# Patient Record
Sex: Male | Born: 1955 | Race: White | Hispanic: No | Marital: Single | Smoking: Current every day smoker
Health system: Southern US, Community
[De-identification: ages and names within clinical notes are randomized; demographics above are authoritative.]

## PROBLEM LIST (undated history)

## (undated) DIAGNOSIS — F429 Obsessive-compulsive disorder, unspecified: Secondary | ICD-10-CM

## (undated) DIAGNOSIS — R972 Elevated prostate specific antigen [PSA]: Secondary | ICD-10-CM

## (undated) DIAGNOSIS — E785 Hyperlipidemia, unspecified: Secondary | ICD-10-CM

## (undated) DIAGNOSIS — Z8719 Personal history of other diseases of the digestive system: Secondary | ICD-10-CM

## (undated) DIAGNOSIS — Z8619 Personal history of other infectious and parasitic diseases: Secondary | ICD-10-CM

## (undated) DIAGNOSIS — R0989 Other specified symptoms and signs involving the circulatory and respiratory systems: Secondary | ICD-10-CM

## (undated) DIAGNOSIS — F419 Anxiety disorder, unspecified: Secondary | ICD-10-CM

## (undated) DIAGNOSIS — K219 Gastro-esophageal reflux disease without esophagitis: Secondary | ICD-10-CM

## (undated) DIAGNOSIS — I10 Essential (primary) hypertension: Secondary | ICD-10-CM

## (undated) DIAGNOSIS — F1020 Alcohol dependence, uncomplicated: Secondary | ICD-10-CM

## (undated) DIAGNOSIS — Z8659 Personal history of other mental and behavioral disorders: Secondary | ICD-10-CM

## (undated) DIAGNOSIS — J449 Chronic obstructive pulmonary disease, unspecified: Secondary | ICD-10-CM

## (undated) DIAGNOSIS — J40 Bronchitis, not specified as acute or chronic: Secondary | ICD-10-CM

## (undated) DIAGNOSIS — E538 Deficiency of other specified B group vitamins: Secondary | ICD-10-CM

## (undated) HISTORY — DX: Essential (primary) hypertension: I10

## (undated) HISTORY — DX: Anxiety disorder, unspecified: F41.9

## (undated) HISTORY — DX: Alcohol dependence, uncomplicated: F10.20

## (undated) HISTORY — DX: Personal history of other diseases of the digestive system: Z87.19

## (undated) HISTORY — DX: Personal history of other mental and behavioral disorders: Z86.59

## (undated) HISTORY — PX: LUMBAR DISC SURGERY: SHX700

## (undated) HISTORY — DX: Chronic obstructive pulmonary disease, unspecified: J44.9

## (undated) HISTORY — PX: ELBOW FRACTURE SURGERY: SHX616

## (undated) HISTORY — DX: Hyperlipidemia, unspecified: E78.5

---
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
## 1999-02-09 ENCOUNTER — Observation Stay (HOSPITAL_COMMUNITY): Admission: RE | Admit: 1999-02-09 | Discharge: 1999-02-10 | Payer: Self-pay | Admitting: Neurosurgery

## 1999-02-09 ENCOUNTER — Encounter: Payer: Self-pay | Admitting: Neurosurgery

## 2010-06-03 ENCOUNTER — Ambulatory Visit: Payer: Self-pay | Admitting: Internal Medicine

## 2010-06-03 ENCOUNTER — Encounter: Payer: Self-pay | Admitting: Internal Medicine

## 2010-06-03 DIAGNOSIS — F17200 Nicotine dependence, unspecified, uncomplicated: Secondary | ICD-10-CM | POA: Insufficient documentation

## 2010-06-03 DIAGNOSIS — F329 Major depressive disorder, single episode, unspecified: Secondary | ICD-10-CM | POA: Insufficient documentation

## 2010-06-03 DIAGNOSIS — F411 Generalized anxiety disorder: Secondary | ICD-10-CM | POA: Insufficient documentation

## 2010-06-03 DIAGNOSIS — K219 Gastro-esophageal reflux disease without esophagitis: Secondary | ICD-10-CM | POA: Insufficient documentation

## 2010-06-03 LAB — CONVERTED CEMR LAB
ALT: 40 units/L (ref 0–53)
AST: 34 units/L (ref 0–37)
Albumin: 4.5 g/dL (ref 3.5–5.2)
Alkaline Phosphatase: 74 units/L (ref 39–117)
BUN: 8 mg/dL (ref 6–23)
Basophils Absolute: 0.1 10*3/uL (ref 0.0–0.1)
Basophils Relative: 0.4 % (ref 0.0–3.0)
Bilirubin, Direct: 0.2 mg/dL (ref 0.0–0.3)
CO2: 29 meq/L (ref 19–32)
Calcium: 9.3 mg/dL (ref 8.4–10.5)
Chloride: 96 meq/L (ref 96–112)
Cholesterol: 217 mg/dL — ABNORMAL HIGH (ref 0–200)
Creatinine, Ser: 1 mg/dL (ref 0.4–1.5)
Direct LDL: 154.1 mg/dL
Eosinophils Absolute: 0.1 10*3/uL (ref 0.0–0.7)
Eosinophils Relative: 0.5 % (ref 0.0–5.0)
GFR calc non Af Amer: 78.87 mL/min (ref >60)
Glucose, Bld: 93 mg/dL (ref 70–99)
HCT: 45.9 % (ref 39.0–52.0)
HDL: 41.2 mg/dL (ref >39.00)
Hemoglobin: 15.9 g/dL (ref 13.0–17.0)
Lymphocytes Relative: 22.5 % (ref 12.0–46.0)
Lymphs Abs: 2.9 10*3/uL (ref 0.7–4.0)
MCHC: 34.7 g/dL (ref 30.0–36.0)
MCV: 93.6 fL (ref 78.0–100.0)
Monocytes Absolute: 0.5 10*3/uL (ref 0.1–1.0)
Monocytes Relative: 3.8 % (ref 3.0–12.0)
Neutro Abs: 9.3 10*3/uL — ABNORMAL HIGH (ref 1.4–7.7)
Neutrophils Relative %: 72.8 % (ref 43.0–77.0)
PSA: 1.9 ng/mL (ref 0.10–4.00)
Platelets: 305 10*3/uL (ref 150.0–400.0)
Potassium: 4.3 meq/L (ref 3.5–5.1)
RBC: 4.91 M/uL (ref 4.22–5.81)
RDW: 13.6 % (ref 11.5–14.6)
Sodium: 136 meq/L (ref 135–145)
TSH: 0.94 microintl units/mL (ref 0.35–5.50)
Total Bilirubin: 0.7 mg/dL (ref 0.3–1.2)
Total CHOL/HDL Ratio: 5
Total Protein: 7.3 g/dL (ref 6.0–8.3)
Triglycerides: 144 mg/dL (ref 0.0–149.0)
VLDL: 28.8 mg/dL (ref 0.0–40.0)
WBC: 12.8 10*3/uL — ABNORMAL HIGH (ref 4.5–10.5)

## 2010-06-04 ENCOUNTER — Encounter: Payer: Self-pay | Admitting: Internal Medicine

## 2010-06-06 ENCOUNTER — Telehealth: Payer: Self-pay | Admitting: Internal Medicine

## 2010-08-15 ENCOUNTER — Ambulatory Visit: Payer: Self-pay | Admitting: Internal Medicine

## 2010-08-15 ENCOUNTER — Telehealth: Payer: Self-pay | Admitting: Internal Medicine

## 2010-08-15 DIAGNOSIS — K921 Melena: Secondary | ICD-10-CM | POA: Insufficient documentation

## 2010-08-15 LAB — CONVERTED CEMR LAB
BUN: 11 mg/dL (ref 6–23)
Basophils Absolute: 0 10*3/uL (ref 0.0–0.1)
Basophils Relative: 0.1 % (ref 0.0–3.0)
CO2: 26 meq/L (ref 19–32)
Calcium: 9.6 mg/dL (ref 8.4–10.5)
Chloride: 98 meq/L (ref 96–112)
Creatinine, Ser: 1 mg/dL (ref 0.4–1.5)
Eosinophils Absolute: 0 10*3/uL (ref 0.0–0.7)
Eosinophils Relative: 0.2 % (ref 0.0–5.0)
GFR calc non Af Amer: 87.48 mL/min (ref >60)
Glucose, Bld: 111 mg/dL — ABNORMAL HIGH (ref 70–99)
HCT: 45.8 % (ref 39.0–52.0)
Hemoglobin: 16.2 g/dL (ref 13.0–17.0)
INR: 1.1 — ABNORMAL HIGH (ref 0.8–1.0)
Lymphocytes Relative: 15 % (ref 12.0–46.0)
Lymphs Abs: 1.8 10*3/uL (ref 0.7–4.0)
MCHC: 35.5 g/dL (ref 30.0–36.0)
MCV: 93 fL (ref 78.0–100.0)
Monocytes Absolute: 0.7 10*3/uL (ref 0.1–1.0)
Monocytes Relative: 5.9 % (ref 3.0–12.0)
Neutro Abs: 9.6 10*3/uL — ABNORMAL HIGH (ref 1.4–7.7)
Neutrophils Relative %: 78.8 % — ABNORMAL HIGH (ref 43.0–77.0)
Platelets: 330 10*3/uL (ref 150.0–400.0)
Potassium: 5.3 meq/L — ABNORMAL HIGH (ref 3.5–5.1)
Prothrombin Time: 11.6 s (ref 9.7–11.8)
RBC: 4.92 M/uL (ref 4.22–5.81)
RDW: 13.3 % (ref 11.5–14.6)
Sodium: 134 meq/L — ABNORMAL LOW (ref 135–145)
WBC: 12.2 10*3/uL — ABNORMAL HIGH (ref 4.5–10.5)
aPTT: 30.8 s — ABNORMAL HIGH (ref 21.7–28.8)

## 2010-08-17 ENCOUNTER — Telehealth: Payer: Self-pay | Admitting: Internal Medicine

## 2010-08-25 ENCOUNTER — Encounter: Payer: Self-pay | Admitting: Gastroenterology

## 2010-09-20 ENCOUNTER — Ambulatory Visit: Payer: Self-pay | Admitting: Gastroenterology

## 2010-09-20 ENCOUNTER — Encounter: Payer: Self-pay | Admitting: Gastroenterology

## 2010-09-20 ENCOUNTER — Encounter (INDEPENDENT_AMBULATORY_CARE_PROVIDER_SITE_OTHER): Payer: Self-pay | Admitting: *Deleted

## 2010-09-20 DIAGNOSIS — K625 Hemorrhage of anus and rectum: Secondary | ICD-10-CM | POA: Insufficient documentation

## 2010-09-20 DIAGNOSIS — K589 Irritable bowel syndrome without diarrhea: Secondary | ICD-10-CM | POA: Insufficient documentation

## 2010-09-20 LAB — CONVERTED CEMR LAB: Tissue Transglutaminase Ab, IgA: 6.4 units (ref <20)

## 2010-09-21 DIAGNOSIS — E538 Deficiency of other specified B group vitamins: Secondary | ICD-10-CM | POA: Insufficient documentation

## 2010-09-21 LAB — CONVERTED CEMR LAB
Ferritin: 104.4 ng/mL (ref 22.0–322.0)
Folate: 9.8 ng/mL
IgA: 133 mg/dL (ref 68–378)
Iron: 128 ug/dL (ref 42–165)
Saturation Ratios: 24 % (ref 20.0–50.0)
Transferrin: 380.8 mg/dL — ABNORMAL HIGH (ref 212.0–360.0)
Vitamin B-12: 219 pg/mL (ref 211–911)

## 2010-09-26 ENCOUNTER — Ambulatory Visit: Payer: Self-pay | Admitting: Gastroenterology

## 2010-11-08 NOTE — Letter (Signed)
Summary: Lipid Letter  [facility]  [address] [place], [place] [address]   Phone: [phone]  Fax: [phone]    06/04/2010  Oscar Phillips [address] [place], [place]  [address]  Dear Luisa Hart:  We have carefully reviewed your last lipid profile from  and the results are noted below with a summary of recommendations for lipid management.    Cholesterol:       217     Goal: <200   HDL "good" Cholesterol:   57.84     Goal: >40   LDL "bad" Cholesterol:   154     Goal: <130   Triglycerides:       144.0     Goal: <150        TLC Diet (Therapeutic Lifestyle Change): Saturated Fats & Transfatty acids should be kept < 7% of total calories ***Reduce Saturated Fats Polyunstaurated Fat can be up to 10% of total calories Monounsaturated Fat Fat can be up to 20% of total calories Total Fat should be no greater than 25-35% of total calories Carbohydrates should be 50-60% of total calories Protein should be approximately 15% of total calories Fiber should be at least 20-30 grams a day ***Increased fiber may help lower LDL Total Cholesterol should be < 200mg /day Consider adding plant stanol/sterols to diet (example: Benacol spread) ***A higher intake of unsaturated fat may reduce Triglycerides and Increase HDL    Adjunctive Measures (may lower LIPIDS and reduce risk of Heart Attack) include: Aerobic Exercise (20-30 minutes 3-4 times a week) Limit Alcohol Consumption Weight Reduction Aspirin 75-81 mg a day by mouth (if not allergic or contraindicated) Dietary Fiber 20-30 grams a day by mouth     Current Medications: 1)    Alprazolam 1 Mg Tabs (Alprazolam) .... Take 1 tablet by mouth four times a day 2)    Asa 81mg   .... Take 1 tablet by mouth once a day 3)    Imipramine Hcl 50 Mg Tabs (Imipramine hcl) .... Take 1 tab by mouth at bedtime  If you have any questions, please call. We appreciate being able to work with you.   Sincerely,    [facility] Etta Grandchild MD

## 2010-11-08 NOTE — Assessment & Plan Note (Signed)
Summary: blood in stool/cd   Vital Signs:  Patient profile:   55 year old male Height:      68 inches Weight:      161 pounds BMI:     24.57 O2 Sat:      98 % on Room air Temp:     98.4 degrees F oral Pulse rate:   80 / minute Pulse rhythm:   regular Resp:     16 per minute BP sitting:   142 / 84  (left arm) Cuff size:   large  Vitals Entered By: Rock Nephew CMA (August 15, 2010 9:56 AM)  O2 Flow:  Room air CC: Patient c/o bright red blood in stool x 08/13/10 Is Patient Diabetic? No Pain Assessment Patient in pain? no       Does patient need assistance? Functional Status Self care Ambulation Normal   Primary Care Provider:  Etta Grandchild MD  CC:  Patient c/o bright red blood in stool x 08/13/10.  History of Present Illness: He returns c/o painless bloody stools for 3 days. He has had some BM's that were all blood, some maroon, no melena.  Preventive Screening-Counseling & Management  Alcohol-Tobacco     Alcohol drinks/day: >5     Alcohol type: beer     >5/day in last 3 mos: yes     Alcohol Counseling: to STOP drinking     Feels need to cut down: yes     Feels annoyed by complaints: yes     Feels guilty re: drinking: yes     Needs 'eye opener' in am: yes     Smoking Status: current     Smoking Cessation Counseling: yes     Smoke Cessation Stage: precontemplative     Packs/Day: 1-2pks daily     Year Started: 1970     Pack years: 74     Tobacco Counseling: to quit use of tobacco products  Hep-HIV-STD-Contraception     Hepatitis Risk: risk noted     Hepatitis Risk Counseling: to avoid increased hepatitis risk     HIV Risk: risk noted     HIV Risk Counseling: to avoid increased HIV risk     STD Risk: risk noted     STD Risk Counseling: to avoid increased STD risk     Dental Visit-last 6 months yes     TSE monthly: yes     Testicular SE Education/Counseling to perform regular STE      Sexual History:  currently monogamous.        Drug Use:  never  and no.        Blood Transfusions:  no.    Clinical Review Panels:  Prevention   Last PSA:  1.90 (06/03/2010)  Immunizations   Last Tetanus Booster:  Historical (10/09/2008)  Lipid Management   Cholesterol:  217 (06/03/2010)   HDL (good cholesterol):  41.20 (06/03/2010)  Diabetes Management   Creatinine:  1.0 (06/03/2010)  CBC   WBC:  12.8 (06/03/2010)   RBC:  4.91 (06/03/2010)   Hgb:  15.9 (06/03/2010)   Hct:  45.9 (06/03/2010)   Platelets:  305.0 (06/03/2010)   MCV  93.6 (06/03/2010)   MCHC  34.7 (06/03/2010)   RDW  13.6 (06/03/2010)   PMN:  72.8 (06/03/2010)   Lymphs:  22.5 (06/03/2010)   Monos:  3.8 (06/03/2010)   Eosinophils:  0.5 (06/03/2010)   Basophil:  0.4 (06/03/2010)  Complete Metabolic Panel   Glucose:  93 (06/03/2010)  Sodium:  136 (06/03/2010)   Potassium:  4.3 (06/03/2010)   Chloride:  96 (06/03/2010)   CO2:  29 (06/03/2010)   BUN:  8 (06/03/2010)   Creatinine:  1.0 (06/03/2010)   Albumin:  4.5 (06/03/2010)   Total Protein:  7.3 (06/03/2010)   Calcium:  9.3 (06/03/2010)   Total Bili:  0.7 (06/03/2010)   Alk Phos:  74 (06/03/2010)   SGPT (ALT):  40 (06/03/2010)   SGOT (AST):  34 (06/03/2010)   Current Medications (verified): 1)  Alprazolam 1 Mg Tabs (Alprazolam) .... Take 1 Tablet By Mouth Four Times A Day 2)  Asa 81mg  .... Take 1 Tablet By Mouth Once A Day 3)  Imipramine Hcl 50 Mg Tabs (Imipramine Hcl) .... Take 1 Tab By Mouth At Bedtime  Allergies (verified): 1)  ! Penicillin  Past History:  Past Medical History: Last updated: 06/03/2010 Anxiety Depression-Dr. Evelene Croon GERD  Past Surgical History: Last updated: 06/03/2010 Lumbar fusion Tonsillectomy  Family History: Last updated: 06/03/2010 Family History of Alcoholism/Addiction Family History Depression Family History High cholesterol Family History Colon Cancer Family History of Colon CA 1st degree relative <60  Social History: Last updated: 06/03/2010 Occupation:  Accounting Single Current Smoker Alcohol use-yes Drug use-no Regular exercise-no  Risk Factors: Alcohol Use: >5 (08/15/2010) >5 drinks/d w/in last 3 months: yes (08/15/2010) Caffeine Use: 2 daily (06/03/2010) Exercise: no (06/03/2010)  Risk Factors: Smoking Status: current (08/15/2010) Packs/Day: 1-2pks daily (08/15/2010)  Family History: Reviewed history from 06/03/2010 and no changes required. Family History of Alcoholism/Addiction Family History Depression Family History High cholesterol Family History Colon Cancer Family History of Colon CA 1st degree relative <60  Social History: Reviewed history from 06/03/2010 and no changes required. Occupation: Accounting Single Current Smoker Alcohol use-yes Drug use-no Regular exercise-no  Review of Systems  The patient denies anorexia, fever, weight loss, chest pain, syncope, dyspnea on exertion, peripheral edema, prolonged cough, hemoptysis, abdominal pain, hematuria, suspicious skin lesions, enlarged lymph nodes, and angioedema.   GI:  Complains of bloody stools; denies abdominal pain, change in bowel habits, constipation, dark tarry stools, diarrhea, excessive appetite, gas, hemorrhoids, indigestion, loss of appetite, nausea, vomiting, vomiting blood, and yellowish skin color.  Physical Exam  General:  alert, well-developed, well-nourished, well-hydrated, appropriate dress, normal appearance, healthy-appearing, cooperative to examination, and good hygiene.   Head:  normocephalic, atraumatic, no abnormalities observed, and no abnormalities palpated.   Mouth:  Oral mucosa and oropharynx without lesions or exudates.  Teeth in good repair. Neck:  supple, full ROM, no masses, no thyromegaly, no JVD, normal carotid upstroke, no carotid bruits, no cervical lymphadenopathy, and no neck tenderness.   Lungs:  normal respiratory effort, no intercostal retractions, no accessory muscle use, normal breath sounds, no dullness, no  fremitus, no crackles, and no wheezes.   Heart:  normal rate, regular rhythm, no murmur, no gallop, no rub, and no JVD.   Abdomen:  soft, non-tender, normal bowel sounds, no distention, no masses, no guarding, no rigidity, no rebound tenderness, no abdominal hernia, no inguinal hernia, no hepatomegaly, and no splenomegaly.   Rectal:  No external abnormalities noted. Normal sphincter tone. No rectal masses or tenderness. there is maroon-colored stool in the rectal vault. no BRB, no hemorrhoids. Msk:  normal ROM, no joint tenderness, no joint swelling, no joint warmth, no redness over joints, no joint deformities, no joint instability, and no crepitation.   Extremities:  No clubbing, cyanosis, edema, or deformity noted with normal full range of motion of all joints.   Neurologic:  No cranial nerve deficits noted. Station and gait are normal. Plantar reflexes are down-going bilaterally. DTRs are symmetrical throughout. Sensory, motor and coordinative functions appear intact. Skin:  turgor normal, color normal, no rashes, no suspicious lesions, no ecchymoses, no petechiae, no purpura, no ulcerations, and no edema.   Cervical Nodes:  no anterior cervical adenopathy and no posterior cervical adenopathy.   Psych:  Cognition and judgment appear intact. Alert and cooperative with normal attention span and concentration. No apparent delusions, illusions, hallucinations   Impression & Recommendations:  Problem # 1:  HEMATOCHEZIA (ICD-578.1) Assessment New this sounds like diverticular bleeding, I will check his coags and will check for blood loss, also will use this opportunity to get his colonoscopy done Orders: Venipuncture (40347) TLB-BMP (Basic Metabolic Panel-BMET) (80048-METABOL) TLB-CBC Platelet - w/Differential (85025-CBCD) TLB-PT (Protime) (85610-PTP) TLB-PTT (85730-PTTL) Gastroenterology Referral (GI) Hemoccult Guaiac-1 spec.(in office) (82270)  Complete Medication List: 1)  Alprazolam 1 Mg  Tabs (Alprazolam) .... Take 1 tablet by mouth four times a day 2)  Asa 81mg   .... Take 1 tablet by mouth once a day 3)  Imipramine Hcl 50 Mg Tabs (Imipramine hcl) .... Take 1 tab by mouth at bedtime  Patient Instructions: 1)  Please schedule a follow-up appointment in 2 weeks. 2)  Tobacco is very bad for your health and your loved ones! You Should stop smoking!. 3)  Stop Smoking Tips: Choose a Quit date. Cut down before the Quit date. decide what you will do as a substitute when you feel the urge to smoke(gum,toothpick,exercise). 4)  Stop taking aspirin for now.   Orders Added: 1)  Venipuncture [36415] 2)  TLB-BMP (Basic Metabolic Panel-BMET) [80048-METABOL] 3)  TLB-CBC Platelet - w/Differential [85025-CBCD] 4)  TLB-PT (Protime) [85610-PTP] 5)  TLB-PTT [85730-PTTL] 6)  Gastroenterology Referral [GI] 7)  Hemoccult Guaiac-1 spec.(in office) [82270] 8)  Est. Patient Level V [42595]

## 2010-11-08 NOTE — Letter (Signed)
Summary: New Patient letter  [facility]  [address] [place], [place] [address]   Phone: [phone]  Fax: [phone]       08/25/2010 MRN: [id number]  [facility] [address] [place], [place]  [address]  Dear Mr. Gavin,  Welcome to the Gastroenterology Division at [facility].    You are scheduled to see Dr.  Jarold Motto on 09-20-10 at [facility] on the 3rd floor at [facility], [address]  We ask that you try to arrive at our office 15 minutes prior to your appointment time to allow for check-in.  We would like you to complete the enclosed self-administered evaluation form prior to your visit and bring it with you on the day of your appointment.  We will review it with you.  Also, please bring a complete list of all your medications or, if you prefer, bring the medication bottles and we will list them.  Please bring your insurance card so that we may make a copy of it.  If your insurance requires a referral to see a specialist, please bring your referral form from your primary care physician.  Co-payments are due at the time of your visit and may be paid by cash, check or credit card.     Your office visit will consist of a consult with your physician (includes a physical exam), any laboratory testing he/she may order, scheduling of any necessary diagnostic testing (e.g. x-ray, ultrasound, CT-scan), and scheduling of a procedure (e.g. Endoscopy, Colonoscopy) if required.  Please allow enough time on your schedule to allow for any/all of these possibilities.    If you cannot keep your appointment, please call [phone] to cancel or reschedule prior to your appointment date.  This allows [place] the opportunity to schedule an appointment for another patient in need of care.  If you do not cancel or reschedule by 5 p.m. the business day prior to your appointment date, you will be charged a $50.00 late cancellation/no-show fee.    Thank you for choosing   Gastroenterology for your medical needs.  We appreciate the opportunity to care for you.  Please visit [place] at our website  to learn more about our practice.                     Sincerely,                                                             The Gastroenterology Division

## 2010-11-08 NOTE — Progress Notes (Signed)
Summary: RESULTS  Phone Note Call from Patient Call back at [phone]   Summary of Call: Patient is requesting results of labs.  Initial call taken by: Lamar Sprinkles, CMA,  August 17, 2010 12:24 PM  Follow-up for Phone Call        no anemia, clotting elements are a little weak which I resume is from alcohol intake Follow-up by: Etta Grandchild MD,  August 17, 2010 12:32 PM  Additional Follow-up for Phone Call Additional follow up Details #1::        pt informed  Additional Follow-up by: Lanier Prude, [facility]),  August 17, 2010 2:49 PM

## 2010-11-08 NOTE — Assessment & Plan Note (Signed)
Summary: NEW / [employer] / # / CD   Vital Signs:  Patient profile:   55 year old male Height:      68 inches Weight:      166.25 pounds BMI:     25.37 O2 Sat:      96 % on Room air Temp:     98.6 degrees F oral Pulse rate:   90 / minute Pulse rhythm:   regular Resp:     16 per minute BP sitting:   122 / 70  (left arm) Cuff size:   large  Vitals Entered By: Rock Nephew CMA (June 03, 2010 1:48 PM)  Nutrition Counseling: Patient's BMI is greater than 25 and therefore counseled on weight management options.  O2 Flow:  Room air  Primary Care Elgie Maziarz:  Etta Grandchild MD  CC:  Cough.  History of Present Illness:  Cough      This is a 55 year old man who presents with Cough.  The symptoms began >1 year ago.  The intensity is described as mild.  The patient reports non-productive cough and shortness of breath, but denies pleuritic chest pain, wheezing, exertional dyspnea, fever, hemoptysis, and malaise.  The patient denies the following symptoms: cold/URI symptoms, sore throat, nasal congestion, chronic rhinitis, weight loss, acid reflux symptoms, and peripheral edema.  The cough is worse with exercise.    Preventive Screening-Counseling & Management  Alcohol-Tobacco     Alcohol drinks/day: >5     Alcohol type: beer     >5/day in last 3 mos: yes     Alcohol Counseling: to STOP drinking     Feels need to cut down: yes     Feels annoyed by complaints: yes     Feels guilty re: drinking: yes     Needs 'eye opener' in am: yes     Smoking Status: current     Smoking Cessation Counseling: yes     Smoke Cessation Stage: precontemplative     Packs/Day: 1-2pks daily     Year Started: 1970     Pack years: 34     Tobacco Counseling: to quit use of tobacco products  Caffeine-Diet-Exercise     Caffeine use/day: 2 daily     Does Patient Exercise: no     PHQ-9 Score: 10-14 moderate depression  Hep-HIV-STD-Contraception     Hepatitis Risk: risk noted     Hepatitis Risk Counseling:  to avoid increased hepatitis risk     HIV Risk: risk noted     HIV Risk Counseling: to avoid increased HIV risk     STD Risk: risk noted     STD Risk Counseling: to avoid increased STD risk     Dental Visit-last 6 months yes     TSE monthly: yes     Testicular SE Education/Counseling to perform regular STE  Safety-Violence-Falls     Seat Belt Use: yes      Sexual History:  currently monogamous.        Drug Use:  never and no.        Blood Transfusions:  no.    Current Medications (verified): 1)  Alprazolam 1 Mg Tabs (Alprazolam) .... Take 1 Tablet By Mouth Four Times A Day 2)  Asa 81mg  .... Take 1 Tablet By Mouth Once A Day 3)  Imipramine Hcl 50 Mg Tabs (Imipramine Hcl) .... Take 1 Tab By Mouth At Bedtime  Allergies (verified): 1)  ! Penicillin  Past History:  Family History: Last updated:  06/03/2010 Family History of Alcoholism/Addiction Family History Depression Family History High cholesterol Family History Colon Cancer Family History of Colon CA 1st degree relative <60  Social History: Last updated: 06/03/2010 Occupation: Accounting Single Current Smoker Alcohol use-yes Drug use-no Regular exercise-no  Risk Factors: Alcohol Use: >5 (06/03/2010) >5 drinks/d w/in last 3 months: yes (06/03/2010) Caffeine Use: 2 daily (06/03/2010) Exercise: no (06/03/2010)  Risk Factors: Smoking Status: current (06/03/2010) Packs/Day: 1-2pks daily (06/03/2010)  Past Medical History: Anxiety Depression-Dr. Evelene Croon GERD  Past Surgical History: Lumbar fusion Tonsillectomy  Family History: Reviewed history and no changes required. Family History of Alcoholism/Addiction Family History Depression Family History High cholesterol Family History Colon Cancer Family History of Colon CA 1st degree relative <60  Social History: Reviewed history and no changes required. Occupation: Advertising account executive Current Smoker Alcohol use-yes Drug use-no Regular exercise-no Smoking  Status:  current Drug Use:  never, no Does Patient Exercise:  no Education:  Armed forces technical officer Care w/in 6 mos.:  yes Seat Belt Use:  yes Caffeine use/day:  2 daily Packs/Day:  1-2pks daily Hepatitis Risk:  risk noted HIV Risk:  risk noted STD Risk:  risk noted Sexual History:  currently monogamous Blood Transfusions:  no  Review of Systems       The patient complains of prolonged cough and depression.  The patient denies anorexia, fever, weight loss, weight gain, chest pain, syncope, dyspnea on exertion, peripheral edema, headaches, hemoptysis, abdominal pain, melena, hematochezia, severe indigestion/heartburn, hematuria, genital sores, suspicious skin lesions, transient blindness, difficulty walking, abnormal bleeding, enlarged lymph nodes, angioedema, and testicular masses.    Physical Exam  General:  alert, well-developed, well-nourished, well-hydrated, appropriate dress, normal appearance, healthy-appearing, cooperative to examination, and good hygiene.   Head:  normocephalic, atraumatic, no abnormalities observed, and no abnormalities palpated.   Eyes:  No corneal or conjunctival inflammation noted. EOMI. Perrla. Funduscopic exam benign, without hemorrhages, exudates or papilledema. Vision grossly normal. no icterus. Ears:  R ear normal and L ear normal.   Mouth:  Oral mucosa and oropharynx without lesions or exudates.  Teeth in good repair. Neck:  supple, full ROM, no masses, no thyromegaly, no JVD, normal carotid upstroke, no carotid bruits, no cervical lymphadenopathy, and no neck tenderness.   Chest Wall:  no deformities, no tenderness, no masses, and no gynecomastia.   Lungs:  normal respiratory effort, no intercostal retractions, no accessory muscle use, normal breath sounds, no dullness, no fremitus, no crackles, and no wheezes.   Heart:  normal rate, regular rhythm, no murmur, no gallop, no rub, and no JVD.   Abdomen:  soft, non-tender, normal bowel sounds, no distention, no  masses, no guarding, no rigidity, no rebound tenderness, no abdominal hernia, no inguinal hernia, no hepatomegaly, and no splenomegaly.   Rectal:  No external abnormalities noted. Normal sphincter tone. No rectal masses or tenderness. Genitalia:  circumcised, no hydrocele, no varicocele, no scrotal masses, no testicular masses or atrophy, no cutaneous lesions, and no urethral discharge.   Prostate:  no nodules, no asymmetry, no induration, and 1+ enlarged.   Msk:  normal ROM, no joint tenderness, no joint swelling, no joint warmth, no redness over joints, no joint deformities, no joint instability, and no crepitation.   Pulses:  R and L carotid,radial,femoral,dorsalis pedis and posterior tibial pulses are full and equal bilaterally Extremities:  No clubbing, cyanosis, edema, or deformity noted with normal full range of motion of all joints.   Neurologic:  No cranial nerve deficits noted. Station and gait are normal. Plantar  reflexes are down-going bilaterally. DTRs are symmetrical throughout. Sensory, motor and coordinative functions appear intact. Skin:  turgor normal, color normal, no rashes, no suspicious lesions, no ecchymoses, no petechiae, no purpura, no ulcerations, and no edema.   Cervical Nodes:  no anterior cervical adenopathy and no posterior cervical adenopathy.   Axillary Nodes:  no R axillary adenopathy and no L axillary adenopathy.   Inguinal Nodes:  no R inguinal adenopathy and no L inguinal adenopathy.   Psych:  Cognition and judgment appear intact. Alert and cooperative with normal attention span and concentration. No apparent delusions, illusions, hallucinations   Impression & Recommendations:  Problem # 1:  DEPRESSION (ICD-311) Assessment Deteriorated he was referred tp Dr. Loran Senters for psychotherapy His updated medication list for this problem includes:    Alprazolam 1 Mg Tabs (Alprazolam) .Marland Kitchen... Take 1 tablet by mouth four times a day    Imipramine Hcl 50 Mg Tabs  (Imipramine hcl) .Marland Kitchen... Take 1 tab by mouth at bedtime  Problem # 2:  COUGH (ICD-786.2) Assessment: New this sounds liike COPD, will check chest xray today to look for pna,mass,etc. Orders: Venipuncture (44010) TLB-Lipid Panel (80061-LIPID) TLB-BMP (Basic Metabolic Panel-BMET) (80048-METABOL) TLB-CBC Platelet - w/Differential (85025-CBCD) TLB-Hepatic/Liver Function Pnl (80076-HEPATIC) TLB-TSH (Thyroid Stimulating Hormone) (84443-TSH) T-2 View CXR (71020TC) Tobacco use cessation intermediate 3-10 minutes (27253)  Problem # 3:  TOBACCO USE (ICD-305.1) Assessment: New  Orders: Venipuncture (66440) TLB-Lipid Panel (80061-LIPID) TLB-BMP (Basic Metabolic Panel-BMET) (80048-METABOL) TLB-CBC Platelet - w/Differential (85025-CBCD) TLB-Hepatic/Liver Function Pnl (80076-HEPATIC) TLB-TSH (Thyroid Stimulating Hormone) (84443-TSH) Tobacco use cessation intermediate 3-10 minutes (34742)  Encouraged smoking cessation and discussed different methods for smoking cessation.   Problem # 4:  ROUTINE GENERAL MEDICAL EXAM@HEALTH  CARE FACL (ICD-V70.0) Assessment: New  Orders: Gastroenterology Referral (GI) Venipuncture (59563) TLB-Lipid Panel (80061-LIPID) TLB-BMP (Basic Metabolic Panel-BMET) (80048-METABOL) TLB-CBC Platelet - w/Differential (85025-CBCD) TLB-Hepatic/Liver Function Pnl (80076-HEPATIC) TLB-TSH (Thyroid Stimulating Hormone) (84443-TSH) TLB-PSA (Prostate Specific Antigen) (84153-PSA) EKG w/ Interpretation (93000) Hemoccult Guaiac-1 spec.(in office) (82270)  Complete Medication List: 1)  Alprazolam 1 Mg Tabs (Alprazolam) .... Take 1 tablet by mouth four times a day 2)  Asa 81mg   .... Take 1 tablet by mouth once a day 3)  Imipramine Hcl 50 Mg Tabs (Imipramine hcl) .... Take 1 tab by mouth at bedtime  Colorectal Screening:  Current Recommendations:    Hemoccult: NEG X 1 today    Colonoscopy recommended: scheduled with G.I.  PSA Screening:    Reviewed PSA screening  recommendations: PSA ordered  Immunization & Chemoprophylaxis:    Tetanus vaccine: Historical  (10/09/2008)  Patient Instructions: 1)  Please attend some AA meetings, try to stop drinking, and let me or Dr. Evelene Croon know if you are wiling to do an inpatient detox at somewhere like [facility]. 2)  Tobacco is very bad for your health and your loved ones! You Should stop smoking!. 3)  Stop Smoking Tips: Choose a Quit date. Cut down before the Quit date. decide what you will do as a substitute when you feel the urge to smoke(gum,toothpick,exercise). 4)  Please schedule a follow-up appointment in 1 month.  Preventive Care Screening  Last Tetanus Booster:    Date:  10/09/2008    Results:  Historical

## 2010-11-08 NOTE — Progress Notes (Signed)
Summary: Call Report  Phone Note Other Incoming   Caller: [facility] Summary of Call: [facility] Triage Call Report Triage Record Num: [id number] Operator: Jeraldine Loots Patient Name: Oscar Phillips Call Date & Time: 08/14/2010 1:30:39PM Patient Phone: [phone] PCP: Sanda Linger Patient Gender: Male PCP Fax : Patient DOB: 1956-08-27 Practice Name: [facility] Reason for Call: Pt calling. Thought he needed to have a bm last night but there was no stool, only blood . States that the toilet water was all red. This morning, he had a bm and the stool looked normal but there was some blood in the water. He is urinating ok and no blood. No known trauma. Last bm prior to the blood last night, was yesterday am but no bleeding noted. No fever. Pt did share that he has an ETOH problem and has had a total of 7 beer this am. He will f/u with office visit in am. Encouraged to try to decrease ETOH consumption today and call if the bleeding reoccurs. Protocol(s) Used: Rectal Symptoms Recommended Outcome per Protocol: See Provider within 24 hours Reason for Outcome: New onset of uncontrolled leaking of bowel movement (loss of sphincter control) not related to constipation Care Advice:  ~ SYMPTOM / CONDITION MANAGEMENT 08/14/2010 1:49:11PM Page 1 of 1 CAN Initial call taken by: Margaret Pyle, CMA,  August 15, 2010 8:27 AM

## 2010-11-08 NOTE — Progress Notes (Signed)
  Phone Note Call from Patient Call back at [phone] till 6pm   Caller: Patient Call For: Etta Grandchild MD Summary of Call: Pt very anxious for chest xray test results. Pt has not received results in the mail as of yet. Initial call taken by: Verdell Face,  June 06, 2010 4:27 PM  Follow-up for Phone Call        Pt informed  Follow-up by: Lamar Sprinkles, CMA,  June 06, 2010 4:58 PM

## 2010-11-08 NOTE — Letter (Signed)
Summary: Results Follow-up Letter  [facility]  [address] [place], [place] [address]   Phone: [phone]  Fax: [phone]    06/04/2010  [address] [place], [place]  [address]  Dear Mr. Lynne,   The following are the results of your recent test(s):  Test     Result     Chest Xray     normal CBC       slightly elevated WBC Liver/kidney   normal Prostate     normal Thyroid     normal    _________________________________________________________  Please call for an appointment as needed _________________________________________________________ _________________________________________________________ _________________________________________________________  Sincerely,  Sanda Linger MD [facility]

## 2010-11-10 NOTE — Assessment & Plan Note (Signed)
Summary: HEMATOCHEZIA/YF   History of Present Illness Visit Type: Initial Consult Primary GI MD: Sheryn Bison MD FACP FAGA Primary Provider: Etta Grandchild MD Requesting Provider: Sanda Linger, MD Chief Complaint: One instance of bloody water in toilet stol 5 weeks ago, none seen since then.  History of Present Illness:   Very Pleasant 55 year old Caucasian male heavy smoker and regular user of alcohol who otherwise is in good health except for chronic IBS with alternating diarrhea and constipation. He had an episode of rectal bleeding of November 7, but has not had further rectal bleeding. At that time he was on aspirin 325 mg a day which he has discontinued. He has not had previous colonoscopy or barium studies.  His mother has colon polyps, and apparently his father had colon cancer. He does suffer from periodic acid reflux managed with over-the-counter medications. He denies dysphasia or any hepatobiliary complaints. History is also present for, chronic depression being treated by psychiatry, and he is on alprazolam and imipramine. He denies current problems with depression.  He smokes 2 packs of cigarettes per day and uses ethanol fairly regularly. He denies any problems with substance abuse or alcoholism, prior hepatitis or pancreatitis, anorexia, weight loss, or food intolerances.   GI Review of Systems    Reports abdominal pain, acid reflux, belching, bloating, and  chest pain.      Denies dysphagia with liquids, dysphagia with solids, heartburn, loss of appetite, nausea, vomiting, vomiting blood, weight loss, and  weight gain.      Reports change in bowel habits, constipation, diarrhea, hemorrhoids, irritable bowel syndrome, rectal bleeding, and  rectal pain.     Denies anal fissure, black tarry stools, diverticulosis, fecal incontinence, heme positive stool, jaundice, light color stool, and  liver problems.    Current Medications (verified): 1)  Alprazolam 1 Mg Tabs  (Alprazolam) .... Take 1 Tablet By Mouth Four Times A Day 2)  Imipramine Hcl 50 Mg Tabs (Imipramine Hcl) .... Take 1 Tab By Mouth At Bedtime  Allergies (verified): 1)  ! Penicillin  Past History:  Past medical, surgical, family and social histories (including risk factors) reviewed for relevance to current acute and chronic problems.  Past Medical History: Anxiety Depression-Dr. Evelene Croon GERD Alcoholism Hyperlipidemia Irritable Bowel Syndrome  Past Surgical History: Reviewed history from 06/03/2010 and no changes required. Lumbar fusion Tonsillectomy  Family History: Reviewed history from 06/03/2010 and no changes required. Family History of Alcoholism/Addiction Family History Depression Family History High cholesterol Family History Colon Cancer Father Family History of Colon CA 1st degree relative <60 Family History of Prostate Cancer:Uncle Family History of Stomach Cancer:Father Family History of Colon Polyps:Mother  Social History: Reviewed history from 06/03/2010 and no changes required. Occupation: Accounting Single Current Smoker Alcohol use-yes Drug use-no Regular exercise-no Daily Caffeine Use  Review of Systems       The patient complains of anxiety-new, back pain, confusion, cough, depression-new, fatigue, heart murmur, shortness of breath, sleeping problems, and swelling of feet/legs.    Vital Signs:  Patient profile:   55 year old male Height:      69 inches Weight:      155.50 pounds BMI:     23.05 Pulse rate:   120 / minute Pulse rhythm:   regular BP sitting:   132 / 78  (left arm) Cuff size:   regular  Vitals Entered By: June McMurray CMA Duncan Dull) (September 20, 2010 9:02 AM)  Physical Exam  General:  Well developed, well nourished, no acute distress.I cannot  appreciate stigmata of chronic liver disease. Head:  Normocephalic and atraumatic. Eyes:  PERRLA, no icterus.exam deferred to patient's ophthalmologist.   Neck:  Supple; no masses or  thyromegaly. Lungs:  Clear throughout to auscultation.decreased BS on L and decreased BS on R.   Heart:  Regular rate and rhythm; no murmurs, rubs,  or bruits. Abdomen:  Soft, nontender and nondistended. No masses, hepatosplenomegaly or hernias noted. Normal bowel sounds. Rectal:  deferred until time of colonoscopy.   Msk:  Symmetrical with no gross deformities. Normal posture. Pulses:  Normal pulses noted. Extremities:  No clubbing, cyanosis, edema or deformities noted. Neurologic:  Alert and  oriented x4;  grossly normal neurologically. Skin:  Intact without significant lesions or rashes. Cervical Nodes:  No significant cervical adenopathy. Psych:  Alert and cooperative. Normal mood and affect.   Impression & Recommendations:  Problem # 1:  RECTAL BLEEDING (ICD-569.3) Assessment Unchanged Probable episode of hemorrhoid bleeding, rule out colonic polyposis with a very strong family history of colon polyps and colon carcinoma. Colonoscopy has been scheduled with propofol per his history of use of alprazolam and imipramine on a regular basis. I have ordered repeat screening lab test to exclude severe iron deficiency. Orders: TLB-B12, Serum-Total ONLY (13244-W10) TLB-Ferritin (82728-FER) TLB-Folic Acid (Folate) (82746-FOL) TLB-IBC Pnl (Iron/FE;Transferrin) (83550-IBC) TLB-IgA (Immunoglobulin A) (82784-IGA) T-Sprue Panel (Celiac Disease Aby Eval) (83516x3/[address]) Colonoscopy (Colon)  Problem # 2:  IRRITABLE BOWEL SYNDROME (ICD-564.1) Assessment: Unchanged Rule Out celiac disease although this is unlikely. Orders: TLB-B12, Serum-Total ONLY (27253-G64) TLB-Ferritin (82728-FER) TLB-Folic Acid (Folate) (82746-FOL) TLB-IBC Pnl (Iron/FE;Transferrin) (83550-IBC) TLB-IgA (Immunoglobulin A) (82784-IGA) T-Sprue Panel (Celiac Disease Aby Eval) (83516x3/[address]) Colonoscopy (Colon)  Problem # 3:  GERD (ICD-530.81) Assessment: Unchanged Managed with periodic H2 blocker therapy. This  does not seem to be a daily problem for this patient.However, he is a good candidate for Barrett's mucosa, and we have scheduled endoscopic exam at the time of colonoscopy.  Problem # 4:  DEPRESSION (ICD-311) Assessment: Improved Continue other medications per Dr. 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 His history also is consistent with possible ethanol abuse. He does have a family history of alcoholism.  Patient Instructions: 1)  Copy sent to : Etta Grandchild MD 2)  Your prescription(s) have been sent to you pharmacy.  3)  Please go to the basement today for your labs.  4)  Your procedure has been scheduled for 10/19/2010, please follow the seperate instructions.  5)  [facility] Patient Information Guide given to patient.  6)  Colonoscopy and Flexible Sigmoidoscopy brochure given.  7)  Upper Endoscopy brochure given.  8)  The medication list was reviewed and reconciled.  All changed / newly prescribed medications were explained.  A complete medication list was provided to the patient / caregiver. Prescriptions: MOVIPREP 100 GM  SOLR (PEG-KCL-NACL-NASULF-NA ASC-C) As per prep instructions.  #1 x 0   Entered by:   Harlow Mares CMA (AAMA)   Authorized by:   Mardella Layman MD [facility]   Signed by:   Harlow Mares CMA (AAMA) on 09/20/2010   Method used:   Electronically to        [facility]. [phone]* (retail)       [address]       [place], [place]  [address]       Ph: [phone]       Fax: [phone]  RxID:   [id number] ALIGN  CAPS (PROBIOTIC PRODUCT) take one by mouth once daily  #30 x 6   Entered by:   Harlow Mares CMA (AAMA)   Authorized by:   Mardella Layman MD [facility]   Signed by:   Harlow Mares CMA (AAMA) on 09/20/2010   Method used:   Electronically to        [facility]. [phone]* (retail)       [address]       [place], [place]  [address]       Ph: [phone]       Fax: [phone]   RxID:   [id number]

## 2010-11-10 NOTE — Letter (Signed)
Summary: [facility] Instructions  [facility]  [address] [place], [place] [address]   Phone: [phone]  Fax: [phone]       Oscar Phillips    08/16/1956    MRN: [id number]        Procedure Day Dorna Bloom: Wednesday 10/19/2010       Arrival Time: 8:00am     Procedure Time: 9:00am     Location of Procedure:                    X  [facility] (4th Floor)   PREPARATION FOR COLONOSCOPY WITH MOVIPREP   Starting 5 days prior to your procedure 10/14/2010 do not eat nuts, seeds, popcorn, corn, beans, peas,  salads, or any raw vegetables.  Do not take any fiber supplements (e.g. Metamucil, Citrucel, and Benefiber).  THE DAY BEFORE YOUR PROCEDURE         Tuesday 10/18/2010  1.  Drink clear liquids the entire day-NO SOLID FOOD  2.  Do not drink anything colored red or purple.  Avoid juices with pulp.  No orange juice.  3.  Drink at least 64 oz. (8 glasses) of fluid/clear liquids during the day to prevent dehydration and help the prep work efficiently.  CLEAR LIQUIDS INCLUDE: Water Jello Ice Popsicles Tea (sugar ok, no milk/cream) Powdered fruit flavored drinks Coffee (sugar ok, no milk/cream) Gatorade Juice: apple, white grape, white cranberry  Lemonade Clear bullion, consomm, broth Carbonated beverages (any kind) Strained chicken noodle soup Hard Candy                             4.  In the morning, mix first dose of MoviPrep solution:    Empty 1 Pouch A and 1 Pouch B into the disposable container    Add lukewarm drinking water to the top line of the container. Mix to dissolve    Refrigerate (mixed solution should be used within 24 hrs)  5.  Begin drinking the prep at 5:00 p.m. The MoviPrep container is divided by 4 marks.   Every 15 minutes drink the solution down to the next mark (approximately 8 oz) until the full liter is complete.   6.  Follow completed prep with 16 oz of clear liquid of your choice (Nothing red or purple).  Continue  to drink clear liquids until bedtime.  7.  Before going to bed, mix second dose of MoviPrep solution:    Empty 1 Pouch A and 1 Pouch B into the disposable container    Add lukewarm drinking water to the top line of the container. Mix to dissolve    Refrigerate  THE DAY OF YOUR PROCEDURE      Wednesday 10/19/2010  Beginning at 4:00am (5 hours before procedure):         1. Every 15 minutes, drink the solution down to the next mark (approx 8 oz) until the full liter is complete.  2. Follow completed prep with 16 oz. of clear liquid of your choice.    3. You may drink clear liquids until 7:00am (2 HOURS BEFORE PROCEDURE).   MEDICATION INSTRUCTIONS  Unless otherwise instructed, you should take regular prescription medications with a small sip of water   as early as possible the morning of your procedure.         OTHER INSTRUCTIONS  You will need a responsible adult at least 56 years of age to accompany you and  drive you home.   This person must remain in the waiting room during your procedure.  Wear loose fitting clothing that is easily removed.  Leave jewelry and other valuables at home.  However, you may wish to bring a book to read or  an iPod/MP3 player to listen to music as you wait for your procedure to start.  Remove all body piercing jewelry and leave at home.  Total time from sign-in until discharge is approximately 2-3 hours.  You should go home directly after your procedure and rest.  You can resume normal activities the  day after your procedure.  The day of your procedure you should not:   Drive   Make legal decisions   Operate machinery   Drink alcohol   Return to work  You will receive specific instructions about eating, activities and medications before you leave.    The above instructions have been reviewed and explained to me by   _______________________    I fully understand and can verbalize these instructions  _____________________________ Date _________

## 2010-11-10 NOTE — Assessment & Plan Note (Signed)
Summary: b12 injection 1 of 3/lk  Nurse Visit   Allergies: 1)  ! Penicillin  Medication Administration  Injection # 1:    Medication: Vit B12 1000 mcg    Diagnosis: VITAMIN B12 DEFICIENCY (ICD-266.2)    Route: IM    Site: L deltoid    Exp Date: 07/09/2012    Lot #: [id number]    Mfr: American Regent    Comments: Vitamin B12 injection 2 of 3    Patient tolerated injection without complications    Given by: June McMurray CMA Duncan Dull) (September 26, 2010 10:31 AM)  Orders Added: 1)  Vit B12 1000 mcg [J3420]   Medication Administration  Injection # 1:    Medication: Vit B12 1000 mcg    Diagnosis: VITAMIN B12 DEFICIENCY (ICD-266.2)    Route: IM    Site: L deltoid    Exp Date: 07/09/2012    Lot #: [id number]    Mfr: American Regent    Comments: Vitamin B12 injection 2 of 3    Patient tolerated injection without complications    Given by: June McMurray CMA Duncan Dull) (September 26, 2010 10:31 AM)  Orders Added: 1)  Vit B12 1000 mcg [J3420]

## 2014-01-01 ENCOUNTER — Encounter: Payer: Self-pay | Admitting: Nurse Practitioner

## 2014-01-01 ENCOUNTER — Ambulatory Visit: Payer: Self-pay | Admitting: Physician Assistant

## 2014-01-01 ENCOUNTER — Ambulatory Visit (INDEPENDENT_AMBULATORY_CARE_PROVIDER_SITE_OTHER): Payer: BC Managed Care – PPO | Admitting: Nurse Practitioner

## 2014-01-01 VITALS — BP 132/74 | HR 92 | Temp 97.6°F | Wt 166.8 lb

## 2014-01-01 DIAGNOSIS — J989 Respiratory disorder, unspecified: Secondary | ICD-10-CM

## 2014-01-01 NOTE — Patient Instructions (Signed)
You have a respiratory virus causing your symptoms. The average duration of cold symptoms is 14 days. Start daily sinus rinses (neilmed Sinus Rinse). Use 30 mg pseudoephedrine twice daily. Sip fluids every hour. Rest. If you are not feeling better in 1 week or develop fever or chest pain, call us for re-evaluation. Feel better!  Upper Respiratory Infection, Adult An upper respiratory infection (URI) is also sometimes known as the common cold. The upper respiratory tract includes the nose, sinuses, throat, trachea, and bronchi. Bronchi are the airways leading to the lungs. Most people improve within 1 week, but symptoms can last up to 2 weeks. A residual cough may last even longer.  CAUSES Many different viruses can infect the tissues lining the upper respiratory tract. The tissues become irritated and inflamed and often become very moist. Mucus production is also common. A cold is contagious. You can easily spread the virus to others by oral contact. This includes kissing, sharing a glass, coughing, or sneezing. Touching your mouth or nose and then touching a surface, which is then touched by another person, can also spread the virus. SYMPTOMS  Symptoms typically develop 1 to 3 days after you come in contact with a cold virus. Symptoms vary from person to person. They may include:  Runny nose.  Sneezing.  Nasal congestion.  Sinus irritation.  Sore throat.  Loss of voice (laryngitis).  Cough.  Fatigue.  Muscle aches.  Loss of appetite.  Headache.  Low-grade fever. DIAGNOSIS  You might diagnose your own cold based on familiar symptoms, since most people get a cold 2 to 3 times a year. Your caregiver can confirm this based on your exam. Most importantly, your caregiver can check that your symptoms are not due to another disease such as strep throat, sinusitis, pneumonia, asthma, or epiglottitis. Blood tests, throat tests, and X-rays are not necessary to diagnose a common cold, but they  may sometimes be helpful in excluding other more serious diseases. Your caregiver will decide if any further tests are required. RISKS AND COMPLICATIONS  You may be at risk for a more severe case of the common cold if you smoke cigarettes, have chronic heart disease (such as heart failure) or lung disease (such as asthma), or if you have a weakened immune system. The very young and very old are also at risk for more serious infections. Bacterial sinusitis, middle ear infections, and bacterial pneumonia can complicate the common cold. The common cold can worsen asthma and chronic obstructive pulmonary disease (COPD). Sometimes, these complications can require emergency medical care and may be life-threatening. PREVENTION  The best way to protect against getting a cold is to practice good hygiene. Avoid oral or hand contact with people with cold symptoms. Wash your hands often if contact occurs. There is no clear evidence that vitamin C, vitamin E, echinacea, or exercise reduces the chance of developing a cold. However, it is always recommended to get plenty of rest and practice good nutrition. TREATMENT  Treatment is directed at relieving symptoms. There is no cure. Antibiotics are not effective, because the infection is caused by a virus, not by bacteria. Treatment may include:  Increased fluid intake. Sports drinks offer valuable electrolytes, sugars, and fluids.  Breathing heated mist or steam (vaporizer or shower).  Eating chicken soup or other clear broths, and maintaining good nutrition.  Getting plenty of rest.  Using gargles or lozenges for comfort.  Controlling fevers with ibuprofen or acetaminophen as directed by your caregiver.  Increasing usage of  your inhaler if you have asthma. Zinc gel and zinc lozenges, taken in the first 24 hours of the common cold, can shorten the duration and lessen the severity of symptoms. Pain medicines may help with fever, muscle aches, and throat pain. A  variety of non-prescription medicines are available to treat congestion and runny nose. Your caregiver can make recommendations and may suggest nasal or lung inhalers for other symptoms.  HOME CARE INSTRUCTIONS   Only take over-the-counter or prescription medicines for pain, discomfort, or fever as directed by your caregiver.  Use a warm mist humidifier or inhale steam from a shower to increase air moisture. This may keep secretions moist and make it easier to breathe.  Drink enough water and fluids to keep your urine clear or pale yellow.  Rest as needed.  Return to work when your temperature has returned to normal or as your caregiver advises. You may need to stay home longer to avoid infecting others. You can also use a face mask and careful hand washing to prevent spread of the virus. SEEK MEDICAL CARE IF:   After the first few days, you feel you are getting worse rather than better.  You need your caregiver's advice about medicines to control symptoms.  You develop chills, worsening shortness of breath, or brown or red sputum. These may be signs of pneumonia.  You develop yellow or brown nasal discharge or pain in the face, especially when you bend forward. These may be signs of sinusitis.  You develop a fever, swollen neck glands, pain with swallowing, or white areas in the back of your throat. These may be signs of strep throat. SEEK IMMEDIATE MEDICAL CARE IF:   You have a fever.  You develop severe or persistent headache, ear pain, sinus pain, or chest pain.  You develop wheezing, a prolonged cough, cough up blood, or have a change in your usual mucus (if you have chronic lung disease).  You develop sore muscles or a stiff neck. Document Released: 03/21/2001 Document Revised: 12/18/2011 Document Reviewed: 01/27/2011 [facility] Patient Information 2014 [place], [place].

## 2014-01-01 NOTE — Progress Notes (Signed)
   Subjective:    Patient ID: Oscar Phillips, male    DOB: [birth year], 58 y.o.   MRN: [id number]  Cough This is a new problem. The current episode started in the past 7 days (4 d). The problem has been unchanged. The problem occurs hourly. The cough is productive of sputum. Associated symptoms include chills (resolved), ear pain and nasal congestion. Pertinent negatives include no chest pain, fever, headaches, sore throat, shortness of breath or wheezing. Nothing aggravates the symptoms.      Review of Systems  Constitutional: Positive for chills (resolved). Negative for fever.  HENT: Positive for ear pain. Negative for sore throat.   Respiratory: Positive for cough. Negative for chest tightness, shortness of breath and wheezing.   Cardiovascular: Negative for chest pain.  Gastrointestinal: Negative for nausea, vomiting and diarrhea.  Musculoskeletal: Negative for back pain.  Neurological: Negative for headaches.       Objective:   Physical Exam  Vitals reviewed. Constitutional: He is oriented to person, place, and time. He appears well-developed and well-nourished. No distress.  HENT:  Head: Normocephalic and atraumatic.  Right Ear: External ear normal.  Left Ear: External ear normal.  Mouth/Throat: Oropharynx is clear and moist. No oropharyngeal exudate.  Stark Klein are purple-pt states ears have been this way for a long time. Bilat effusion, bones visible.  Eyes: Conjunctivae are normal. Right eye exhibits no discharge. Left eye exhibits no discharge.  Neck: Normal range of motion. Neck supple. No thyromegaly present.  Cardiovascular: Normal rate, regular rhythm and normal heart sounds.   No murmur heard. Pulmonary/Chest: Effort normal and breath sounds normal. No respiratory distress. He has no wheezes. He has no rales.  Lymphadenopathy:    He has no cervical adenopathy.  Neurological: He is alert and oriented to person, place, and time.  Skin: Skin is warm and dry.    Psychiatric: He has a normal mood and affect. His behavior is normal. Thought content normal.          Assessment & Plan:   1. Respiratory illness Likely viral. See pt instructions

## 2014-01-01 NOTE — Progress Notes (Signed)
Pre visit review using our clinic review tool, if applicable. No additional management support is needed unless otherwise documented below in the visit note. 

## 2015-05-12 ENCOUNTER — Telehealth: Payer: Self-pay | Admitting: Internal Medicine

## 2015-05-12 NOTE — Telephone Encounter (Signed)
Patient is needing a pcp and he hasn't seen you in 5 years. Can you take him on as a new patient?

## 2015-05-12 NOTE — Telephone Encounter (Signed)
yes

## 2015-05-12 NOTE — Telephone Encounter (Signed)
LMOVM

## 2015-05-20 ENCOUNTER — Telehealth: Payer: Self-pay

## 2015-05-20 ENCOUNTER — Other Ambulatory Visit (INDEPENDENT_AMBULATORY_CARE_PROVIDER_SITE_OTHER): Payer: BLUE CROSS/BLUE SHIELD

## 2015-05-20 ENCOUNTER — Ambulatory Visit (INDEPENDENT_AMBULATORY_CARE_PROVIDER_SITE_OTHER): Payer: BLUE CROSS/BLUE SHIELD | Admitting: Internal Medicine

## 2015-05-20 ENCOUNTER — Encounter: Payer: Self-pay | Admitting: Internal Medicine

## 2015-05-20 ENCOUNTER — Ambulatory Visit (INDEPENDENT_AMBULATORY_CARE_PROVIDER_SITE_OTHER)
Admission: RE | Admit: 2015-05-20 | Discharge: 2015-05-20 | Disposition: A | Discharge status: Home / Self Care | Payer: BLUE CROSS/BLUE SHIELD | Source: Ambulatory Visit | Attending: Internal Medicine | Admitting: Internal Medicine

## 2015-05-20 VITALS — BP 160/94 | HR 98 | Temp 98.0°F | Resp 16 | Ht 69.0 in | Wt 164.0 lb

## 2015-05-20 DIAGNOSIS — Z720 Tobacco use: Secondary | ICD-10-CM | POA: Diagnosis not present

## 2015-05-20 DIAGNOSIS — R0609 Other forms of dyspnea: Secondary | ICD-10-CM

## 2015-05-20 DIAGNOSIS — R9431 Abnormal electrocardiogram [ECG] [EKG]: Secondary | ICD-10-CM

## 2015-05-20 DIAGNOSIS — K625 Hemorrhage of anus and rectum: Secondary | ICD-10-CM

## 2015-05-20 DIAGNOSIS — J449 Chronic obstructive pulmonary disease, unspecified: Secondary | ICD-10-CM | POA: Insufficient documentation

## 2015-05-20 DIAGNOSIS — E538 Deficiency of other specified B group vitamins: Secondary | ICD-10-CM

## 2015-05-20 DIAGNOSIS — I10 Essential (primary) hypertension: Secondary | ICD-10-CM

## 2015-05-20 DIAGNOSIS — Z0000 Encounter for general adult medical examination without abnormal findings: Secondary | ICD-10-CM | POA: Diagnosis not present

## 2015-05-20 DIAGNOSIS — R05 Cough: Secondary | ICD-10-CM

## 2015-05-20 DIAGNOSIS — R739 Hyperglycemia, unspecified: Secondary | ICD-10-CM

## 2015-05-20 DIAGNOSIS — R0600 Dyspnea, unspecified: Secondary | ICD-10-CM | POA: Insufficient documentation

## 2015-05-20 DIAGNOSIS — R059 Cough, unspecified: Secondary | ICD-10-CM

## 2015-05-20 DIAGNOSIS — J4489 Other specified chronic obstructive pulmonary disease: Secondary | ICD-10-CM

## 2015-05-20 DIAGNOSIS — F17200 Nicotine dependence, unspecified, uncomplicated: Secondary | ICD-10-CM

## 2015-05-20 DIAGNOSIS — E785 Hyperlipidemia, unspecified: Secondary | ICD-10-CM

## 2015-05-20 LAB — LIPID PANEL
CHOL/HDL RATIO: 5
CHOLESTEROL: 200 mg/dL (ref 0–200)
HDL: 37.4 mg/dL — ABNORMAL LOW (ref >39.00)
LDL CALC: 138 mg/dL — AB (ref 0–99)
NonHDL: 162.71
Triglycerides: 123 mg/dL (ref 0.0–149.0)
VLDL: 24.6 mg/dL (ref 0.0–40.0)

## 2015-05-20 LAB — HEMOGLOBIN A1C: HEMOGLOBIN A1C: 5.7 % (ref 4.6–6.5)

## 2015-05-20 LAB — COMPREHENSIVE METABOLIC PANEL
ALK PHOS: 69 U/L (ref 39–117)
ALT: 17 U/L (ref 0–53)
AST: 21 U/L (ref 0–37)
Albumin: 4.6 g/dL (ref 3.5–5.2)
BUN: 8 mg/dL (ref 6–23)
CALCIUM: 9.5 mg/dL (ref 8.4–10.5)
CHLORIDE: 99 meq/L (ref 96–112)
CO2: 29 mEq/L (ref 19–32)
CREATININE: 0.86 mg/dL (ref 0.40–1.50)
GFR: 96.49 mL/min (ref >60.00)
Glucose, Bld: 89 mg/dL (ref 70–99)
Potassium: 4.1 mEq/L (ref 3.5–5.1)
Sodium: 137 mEq/L (ref 135–145)
Total Bilirubin: 0.9 mg/dL (ref 0.2–1.2)
Total Protein: 7.5 g/dL (ref 6.0–8.3)

## 2015-05-20 LAB — CBC WITH DIFFERENTIAL/PLATELET
BASOS PCT: 0.6 % (ref 0.0–3.0)
Basophils Absolute: 0.1 10*3/uL (ref 0.0–0.1)
EOS ABS: 0.1 10*3/uL (ref 0.0–0.7)
EOS PCT: 0.6 % (ref 0.0–5.0)
HCT: 48.8 % (ref 39.0–52.0)
HEMOGLOBIN: 16.5 g/dL (ref 13.0–17.0)
LYMPHS PCT: 32.5 % (ref 12.0–46.0)
Lymphs Abs: 2.8 10*3/uL (ref 0.7–4.0)
MCHC: 33.8 g/dL (ref 30.0–36.0)
MCV: 90.8 fl (ref 78.0–100.0)
MONOS PCT: 4.8 % (ref 3.0–12.0)
Monocytes Absolute: 0.4 10*3/uL (ref 0.1–1.0)
NEUTROS PCT: 61.5 % (ref 43.0–77.0)
Neutro Abs: 5.3 10*3/uL (ref 1.4–7.7)
Platelets: 266 10*3/uL (ref 150.0–400.0)
RBC: 5.37 Mil/uL (ref 4.22–5.81)
RDW: 14.2 % (ref 11.5–15.5)
WBC: 8.7 10*3/uL (ref 4.0–10.5)

## 2015-05-20 LAB — FOLATE: FOLATE: 20.1 ng/mL (ref >5.9)

## 2015-05-20 LAB — VITAMIN B12: VITAMIN B 12: 170 pg/mL — AB (ref 211–911)

## 2015-05-20 LAB — FECAL OCCULT BLOOD, GUAIAC: Fecal Occult Blood: NEGATIVE

## 2015-05-20 LAB — PSA: PSA: 3.91 ng/mL (ref 0.10–4.00)

## 2015-05-20 LAB — TSH: TSH: 0.75 u[IU]/mL (ref 0.35–4.50)

## 2015-05-20 MED ORDER — AZILSARTAN MEDOXOMIL 80 MG PO TABS: 1.0000 | ORAL_TABLET | Freq: Every day | ORAL | Status: DC

## 2015-05-20 MED ORDER — ATORVASTATIN CALCIUM 40 MG PO TABS: 40.0000 mg | ORAL_TABLET | Freq: Every day | ORAL | Status: DC

## 2015-05-20 MED ORDER — ASPIRIN EC 81 MG PO TBEC: 81.0000 mg | DELAYED_RELEASE_TABLET | Freq: Every day | ORAL | Status: DC

## 2015-05-20 NOTE — Patient Instructions (Signed)

## 2015-05-20 NOTE — Telephone Encounter (Signed)
-----   Message from Janith Lima, MD sent at 05/20/2015  4:03 PM EDT ----- His Vit B12 level is low - please ask him to come back for a B12 injection

## 2015-05-20 NOTE — Telephone Encounter (Signed)
Called pt to schedule nurse visit for B12 injection. LMOVM

## 2015-05-20 NOTE — Progress Notes (Signed)
Pre visit review using our clinic review tool, if applicable. No additional management support is needed unless otherwise documented below in the visit note. 

## 2015-05-21 ENCOUNTER — Telehealth: Payer: Self-pay

## 2015-05-21 NOTE — Telephone Encounter (Signed)
Spoke with pt regarding lab results. Schedulars are working on Research scientist (life sciences) visit for B12 pt has strict work schedule

## 2015-05-21 NOTE — Telephone Encounter (Signed)
PA approved.

## 2015-05-21 NOTE — Telephone Encounter (Signed)
PA started via covermymeds. Edarbi 80 mg daily

## 2015-05-23 ENCOUNTER — Encounter: Payer: Self-pay | Admitting: Internal Medicine

## 2015-05-23 MED ORDER — UMECLIDINIUM BROMIDE 62.5 MCG/INH IN AEPB: 1.0000 | INHALATION_SPRAY | Freq: Every day | RESPIRATORY_TRACT | Status: DC

## 2015-05-23 NOTE — Progress Notes (Signed)
Subjective:  Patient ID: Oscar Phillips, male    DOB: 08-Sep-1956  Age: 59 y.o. MRN: [id number]  CC: Annual Exam; Cough; and Hypertension   HPI [name] presents for physical. I haven't seen him in about 5 years. He complains of a chronic cough for over one year. The cough is productive of a thick, white phlegm. He also complains of wheezing, shortness of breath and dyspnea on exertion. He also has intermittent episodes of bright red blood per rectum.  History [name] has no past medical history on file.   He has past surgical history that includes Tonsillectomy (1965).   His family history includes Alcohol abuse in his maternal uncle; Cancer in his father and maternal uncle; Hyperlipidemia in his father. There is no history of Diabetes, Drug abuse, Early death, Heart disease, Hypertension, Kidney disease, or Stroke.He reports that he has been smoking Cigarettes.  He has a 25 pack-year smoking history. He has never used smokeless tobacco. He reports that he drinks about 9.0 oz of alcohol per week. He reports that he does not use illicit drugs.  Outpatient Prescriptions Prior to Visit  Medication Sig Dispense Refill  . ALPRAZOLAM PO Take by mouth daily.    . Imipramine HCl (TOFRANIL PO) Take by mouth daily.     No facility-administered medications prior to visit.    ROS Review of Systems  Constitutional: Negative.  Negative for fever, chills, diaphoresis, activity change, appetite change, fatigue and unexpected weight change.  HENT: Negative.   Eyes: Negative.   Respiratory: Positive for cough and shortness of breath. Negative for apnea, choking, chest tightness, wheezing and stridor.   Cardiovascular: Negative.  Negative for chest pain, palpitations and leg swelling.  Gastrointestinal: Positive for blood in stool and anal bleeding. Negative for nausea, vomiting, abdominal pain, diarrhea, constipation, abdominal distention and rectal pain.  Endocrine: Negative.     Genitourinary: Negative.  Negative for difficulty urinating.  Musculoskeletal: Negative.  Negative for myalgias, back pain, joint swelling and arthralgias.  Skin: Negative.  Negative for rash.  Allergic/Immunologic: Negative.   Neurological: Negative.  Negative for dizziness, tremors, syncope, weakness, light-headedness, numbness and headaches.  Hematological: Negative.  Negative for adenopathy. Does not bruise/bleed easily.  Psychiatric/Behavioral: Negative for suicidal ideas, behavioral problems, confusion, sleep disturbance, self-injury, dysphoric mood, decreased concentration and agitation. The patient is nervous/anxious. The patient is not hyperactive.     Objective:  BP 160/94 mmHg  Pulse 98  Temp(Src) 98 F (36.7 C) (Oral)  Resp 16  Ht 5\' 9"  (1.753 m)  Wt 164 lb (74.39 kg)  BMI 24.21 kg/m2  SpO2 98%  Physical Exam  Constitutional: He appears well-developed and well-nourished.  Non-toxic appearance. He does not have a sickly appearance. He does not appear ill. No distress. He is not intubated.  HENT:  Head: Normocephalic and atraumatic.  Mouth/Throat: Oropharynx is clear and moist. No oropharyngeal exudate.  Eyes: Conjunctivae are normal. Right eye exhibits no discharge. Left eye exhibits no discharge. No scleral icterus.  Neck: Normal range of motion. Neck supple. No JVD present. No tracheal deviation present. No thyromegaly present.  Cardiovascular: Normal rate, regular rhythm, normal heart sounds and intact distal pulses.  Exam reveals no gallop and no friction rub.   No murmur heard. EKG today  Sinus  Rhythm  -Right bundle branch block with left axis -bifascicular block.   ABNORMAL - this is unchanged from his last EKG done 5 years ago.  Pulmonary/Chest: Effort normal. No accessory muscle usage or stridor. No tachypnea.  He is not intubated. No respiratory distress. He has no decreased breath sounds. He has no wheezes. He has rhonchi in the right middle field and the  left middle field. He has no rales. He exhibits no tenderness.  Abdominal: Soft. Bowel sounds are normal. He exhibits no distension and no mass. There is no tenderness. There is no rebound and no guarding. Hernia confirmed negative in the right inguinal area and confirmed negative in the left inguinal area.  Genitourinary: Rectum normal and testes normal. Rectal exam shows no external hemorrhoid, no internal hemorrhoid, no fissure, no mass, no tenderness and anal tone normal. Guaiac negative stool. Prostate is enlarged (1+ smooth symm BPH). Prostate is not tender. Right testis shows no mass, no swelling and no tenderness. Right testis is descended. Left testis shows no mass, no swelling and no tenderness. Left testis is descended. Circumcised. No penile erythema or penile tenderness. No discharge found.  Musculoskeletal: Normal range of motion. He exhibits no edema or tenderness.  Lymphadenopathy:    He has no cervical adenopathy.       Right: No inguinal adenopathy present.       Left: No inguinal adenopathy present.  Skin: Skin is warm and dry. No rash noted. He is not diaphoretic. No erythema. No pallor.  Psychiatric: He has a normal mood and affect. His behavior is normal. Judgment and thought content normal.  Vitals reviewed.   Lab Results  Component Value Date   WBC 8.7 05/20/2015   HGB 16.5 05/20/2015   HCT 48.8 05/20/2015   PLT 266.0 05/20/2015   GLUCOSE 89 05/20/2015   CHOL 200 05/20/2015   TRIG 123.0 05/20/2015   HDL 37.40* 05/20/2015   LDLDIRECT 154.1 06/03/2010   LDLCALC 138* 05/20/2015   ALT 17 05/20/2015   AST 21 05/20/2015   NA 137 05/20/2015   K 4.1 05/20/2015   CL 99 05/20/2015   CREATININE 0.86 05/20/2015   BUN 8 05/20/2015   CO2 29 05/20/2015   TSH 0.75 05/20/2015   PSA 3.91 05/20/2015   INR 1.1 ratio* 08/15/2010   HGBA1C 5.7 05/20/2015    Assessment & Plan:   Paco was seen today for annual exam, cough and hypertension.  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 -     DG Chest 2 View; Future -     Pulmonary Function Test; Future  Hyperglycemia- he has prediabetes and agrees to work on his lifestyle modifications. -     Hemoglobin A1c; Future  Routine general medical examination at a health care facility- exam done, vaccines reviewed and updated, labs ordered and reviewed, he was referred for colonoscopy, patient education material given. -     Lipid panel; Future -     Comprehensive metabolic panel; Future -     CBC with Differential/Platelet;  Future -     TSH; Future -     PSA; Future  Essential hypertension- his EKG is negative for LVH, his labs are negative for any concern for secondary causes are in organ damage, I think he should start an ARB. -     EKG 12-Lead -     Azilsartan Medoxomil (EDARBI) 80 MG TABS; Take 1 tablet (80 mg total) by mouth daily.  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 exacerbation-he agrees to try to quit smoking, will start Incruse for this.  DOE (dyspnea on exertion)- he has lung disease that could explain this but he also has significant risk factors for cardiac ischemia, his EKG is chronically abnormal, I have asked him to have a myocardial perfusion imaging done to screen for coronary artery disease. -     Myocardial Perfusion Imaging; Future  Nonspecific abnormal electrocardiogram (ECG) (EKG) -     Myocardial Perfusion Imaging;  Future  Hyperlipidemia with target LDL less than 100- his Framingham risk score is over 30% therefore I have asked him to start a statin and an aspirin a day. -     atorvastatin (LIPITOR) 40 MG tablet; Take 1 tablet (40 mg total) by mouth daily. -     aspirin EC 81 MG tablet; Take 1 tablet (81 mg total) by mouth daily.   I have discontinued Mr. Trovato's ALPRAZOLAM PO and Imipramine HCl (TOFRANIL PO). I am also having him start on Azilsartan Medoxomil, atorvastatin, aspirin EC, and Umeclidinium Bromide. Additionally, I am having him maintain his ALPRAZolam and imipramine.  Meds ordered this encounter  Medications  . ALPRAZolam (XANAX) 1 MG tablet    Sig:     Refill:  0  . imipramine (TOFRANIL) 50 MG tablet    Sig:     Refill:  1  . Azilsartan Medoxomil (EDARBI) 80 MG TABS    Sig: Take 1 tablet (80 mg total) by mouth daily.    Dispense:  30 tablet    Refill:  11  . atorvastatin (LIPITOR) 40 MG tablet    Sig: Take 1 tablet (40 mg total) by mouth daily.    Dispense:  90 tablet    Refill:  3  . aspirin EC 81 MG tablet    Sig: Take 1 tablet (81 mg total) by mouth daily.    Dispense:  90 tablet    Refill:  1  . Umeclidinium Bromide (INCRUSE ELLIPTA) 62.5 MCG/INH AEPB    Sig: Inhale 1 puff into the lungs daily.    Dispense:  30 each    Refill:  11     Follow-up: Return in about 6 weeks (around 07/01/2015).  Scarlette Calico, MD

## 2015-05-24 ENCOUNTER — Encounter: Payer: Self-pay | Admitting: Internal Medicine

## 2015-05-24 ENCOUNTER — Encounter: Payer: Self-pay | Admitting: Gastroenterology

## 2015-05-24 MED ORDER — CYANOCOBALAMIN 500 MCG/0.1ML NA SOLN: 0.1000 mL | NASAL | Status: DC

## 2015-05-24 NOTE — Addendum Note (Signed)
Addended by: Janith Lima on: 05/24/2015 03:55 PM   Modules accepted: Orders

## 2015-05-25 ENCOUNTER — Ambulatory Visit (INDEPENDENT_AMBULATORY_CARE_PROVIDER_SITE_OTHER): Payer: BLUE CROSS/BLUE SHIELD | Admitting: Emergency Medicine

## 2015-05-25 DIAGNOSIS — E538 Deficiency of other specified B group vitamins: Secondary | ICD-10-CM

## 2015-05-25 MED ORDER — CYANOCOBALAMIN 1000 MCG/ML IJ SOLN
1000.0000 ug | INTRAMUSCULAR | Status: DC
  Administered 2015-05-25: 1000 ug via INTRAMUSCULAR

## 2015-05-27 ENCOUNTER — Telehealth (HOSPITAL_COMMUNITY): Payer: Self-pay

## 2015-05-27 NOTE — Telephone Encounter (Signed)
Left message on voicemail per DPR in reference to upcoming appointment scheduled on 06-01-2015 at 0745 with detailed instructions given per Myocardial Perfusion Study Information Sheet for the test. LM to arrive 15 minutes early, and that it is imperative to arrive on time for appointment to keep from having the test rescheduled.Phone number given for call back for any questions. Oletta Lamas, Kha Hari A

## 2015-05-28 ENCOUNTER — Telehealth: Payer: Self-pay

## 2015-05-28 NOTE — Telephone Encounter (Signed)
Pa initiated for b12. KEY: [id number]

## 2015-05-31 ENCOUNTER — Telehealth (HOSPITAL_COMMUNITY): Payer: Self-pay

## 2015-05-31 NOTE — Telephone Encounter (Signed)
Left message on voicemail in reference to upcoming appointment scheduled for 06-01-2015 to verify if the patient is coming for the test. There is a cancellation on the patient's paperwork, but the patient is still on the schedule.  Phone number given for a call back so details instructions can be given. Oscar Phillips, [name]

## 2015-06-01 ENCOUNTER — Ambulatory Visit (HOSPITAL_COMMUNITY): Payer: BLUE CROSS/BLUE SHIELD | Attending: Cardiovascular Disease

## 2015-06-01 ENCOUNTER — Encounter (HOSPITAL_COMMUNITY): Payer: BLUE CROSS/BLUE SHIELD

## 2015-06-01 DIAGNOSIS — R0609 Other forms of dyspnea: Secondary | ICD-10-CM | POA: Diagnosis present

## 2015-06-01 DIAGNOSIS — R002 Palpitations: Secondary | ICD-10-CM | POA: Insufficient documentation

## 2015-06-01 DIAGNOSIS — R9439 Abnormal result of other cardiovascular function study: Secondary | ICD-10-CM | POA: Diagnosis not present

## 2015-06-01 DIAGNOSIS — R9431 Abnormal electrocardiogram [ECG] [EKG]: Secondary | ICD-10-CM

## 2015-06-01 DIAGNOSIS — R0600 Dyspnea, unspecified: Secondary | ICD-10-CM

## 2015-06-01 LAB — MYOCARDIAL PERFUSION IMAGING
CHL CUP NUCLEAR SSS: 9
LVDIAVOL: 104 mL
LVSYSVOL: 39 mL
Peak HR: 96 {beats}/min
RATE: 0.26
Rest HR: 84 {beats}/min
SDS: 1
SRS: 8
TID: 0.96

## 2015-06-01 MED ORDER — TECHNETIUM TC 99M SESTAMIBI GENERIC - CARDIOLITE
32.4000 | Freq: Once | INTRAVENOUS | Status: AC | PRN
  Administered 2015-06-01: 32 via INTRAVENOUS

## 2015-06-01 MED ORDER — TECHNETIUM TC 99M SESTAMIBI GENERIC - CARDIOLITE
10.8000 | Freq: Once | INTRAVENOUS | Status: AC | PRN
  Administered 2015-06-01: 11 via INTRAVENOUS

## 2015-06-01 MED ORDER — REGADENOSON 0.4 MG/5ML IV SOLN
0.4000 mg | Freq: Once | INTRAVENOUS | Status: AC
  Administered 2015-06-01: 0.4 mg via INTRAVENOUS

## 2015-06-01 NOTE — Telephone Encounter (Signed)
Fax came in stating the PA is approved.

## 2015-06-01 NOTE — Telephone Encounter (Signed)
Pharmacy notified.

## 2015-06-02 ENCOUNTER — Encounter: Payer: Self-pay | Admitting: Internal Medicine

## 2015-06-03 ENCOUNTER — Telehealth: Payer: Self-pay | Admitting: Internal Medicine

## 2015-06-03 NOTE — Telephone Encounter (Signed)
Not sure who called pt. He just had a stress test done on 06/01/15. LMVOM

## 2015-06-03 NOTE — Telephone Encounter (Signed)
LMOVM. Dr. Ronnald Ramp sent out a letter for pt regarding stress test. If he should call back please look at letter from Dr. Ronnald Ramp about heart attack. No additional notes were on chest xray

## 2015-06-03 NOTE — Telephone Encounter (Signed)
Patient states he received a call from our office. He does not know who called or why they were calling. He recently had a chest xray and would like his results, as well as a nuclear stress test. Best CB# is [phone].

## 2015-06-17 NOTE — Telephone Encounter (Signed)
Patient is requesting your call re: nuclear stress test results. Please give him a call @ the # listed

## 2015-06-18 ENCOUNTER — Telehealth: Payer: Self-pay | Admitting: Internal Medicine

## 2015-06-18 NOTE — Telephone Encounter (Signed)
LMOVM.  A letter was sent out with the results. PLease ask pt if he did not receive we can resend the letter. It is hard to get a hold of pt due to his work schedule

## 2015-06-18 NOTE — Telephone Encounter (Signed)
Pt returned your call regarding his stress test results. Call him back on his home #.

## 2015-06-22 ENCOUNTER — Ambulatory Visit: Payer: BLUE CROSS/BLUE SHIELD

## 2015-06-24 ENCOUNTER — Telehealth: Payer: Self-pay | Admitting: Internal Medicine

## 2015-06-24 NOTE — Telephone Encounter (Signed)
Pt called request to speak to the assistant. Pt stated she he is having surgery on his arm (broken, this is under worker comp) and [facility] [phone], wants to know which type of BP medication that Dr. Ronnald Ramp put him on so they know which type of anesthesia to use during surgery. Please call pt

## 2015-06-24 NOTE — Telephone Encounter (Signed)
Pt calling to state he is trying to have surgery on his arm and needs his med list sent over to [place] he also wanted other notes sent over. He stated anything that needed to be sent over to speed up the process he agrees with I will f/u with the office to see what information they need.

## 2015-06-25 ENCOUNTER — Telehealth: Payer: Self-pay | Admitting: Internal Medicine

## 2015-06-25 NOTE — Telephone Encounter (Signed)
Patient states he does not need to go to [facility] orth[facility].  He is no longer going there b/c of workers comp.  He will be going to Dr. Noemi Chapel.  He would like a call in regards.

## 2015-06-25 NOTE — Telephone Encounter (Signed)
Patient calling to follow up on request. i advised that you noted you were following up. You were unavailable at the time of this call. He asks that you give him a call to let him know either way

## 2015-06-25 NOTE — Telephone Encounter (Signed)
Pt no longer going to [facility] orth[facility] will f/u with pt

## 2015-06-28 ENCOUNTER — Other Ambulatory Visit: Payer: Self-pay | Admitting: Orthopedic Surgery

## 2015-06-28 DIAGNOSIS — S42402A Unspecified fracture of lower end of left humerus, initial encounter for closed fracture: Secondary | ICD-10-CM

## 2015-06-28 DIAGNOSIS — M25522 Pain in left elbow: Secondary | ICD-10-CM

## 2015-06-29 ENCOUNTER — Ambulatory Visit
Admission: RE | Admit: 2015-06-29 | Discharge: 2015-06-29 | Disposition: A | Discharge status: Home / Self Care | Payer: BLUE CROSS/BLUE SHIELD | Source: Ambulatory Visit | Attending: Orthopedic Surgery | Admitting: Orthopedic Surgery

## 2015-06-29 DIAGNOSIS — S42402A Unspecified fracture of lower end of left humerus, initial encounter for closed fracture: Secondary | ICD-10-CM

## 2015-06-29 DIAGNOSIS — M25522 Pain in left elbow: Secondary | ICD-10-CM

## 2015-07-01 NOTE — Telephone Encounter (Signed)
PT underwent surgery today on his LT arm [facility] Dr. Nigel Berthold. Was calling to inform.

## 2015-07-06 ENCOUNTER — Ambulatory Visit (INDEPENDENT_AMBULATORY_CARE_PROVIDER_SITE_OTHER): Payer: BLUE CROSS/BLUE SHIELD | Admitting: Internal Medicine

## 2015-07-06 DIAGNOSIS — R059 Cough, unspecified: Secondary | ICD-10-CM

## 2015-07-06 DIAGNOSIS — Z720 Tobacco use: Secondary | ICD-10-CM

## 2015-07-06 DIAGNOSIS — F17200 Nicotine dependence, unspecified, uncomplicated: Secondary | ICD-10-CM

## 2015-07-06 DIAGNOSIS — R05 Cough: Secondary | ICD-10-CM

## 2015-07-06 LAB — PULMONARY FUNCTION TEST
DL/VA % PRED: 63 %
DL/VA: 2.85 ml/min/mmHg/L
DLCO UNC % PRED: 67 %
DLCO unc: 19.87 ml/min/mmHg
FEF 25-75 PRE: 1.49 L/s
FEF 25-75 Post: 1.79 L/sec
FEF2575-%CHANGE-POST: 19 %
FEF2575-%PRED-POST: 63 %
FEF2575-%Pred-Pre: 53 %
FEV1-%CHANGE-POST: 4 %
FEV1-%Pred-Post: 91 %
FEV1-%Pred-Pre: 87 %
FEV1-Post: 3.08 L
FEV1-Pre: 2.94 L
FEV1FVC-%CHANGE-POST: -2 %
FEV1FVC-%Pred-Pre: 83 %
FEV6-%CHANGE-POST: 5 %
FEV6-%Pred-Post: 113 %
FEV6-%Pred-Pre: 106 %
FEV6-PRE: 4.51 L
FEV6-Post: 4.78 L
FEV6FVC-%Change-Post: -1 %
FEV6FVC-%PRED-PRE: 102 %
FEV6FVC-%Pred-Post: 101 %
FVC-%CHANGE-POST: 7 %
FVC-%PRED-POST: 112 %
FVC-%Pred-Pre: 104 %
FVC-Post: 4.97 L
FVC-Pre: 4.63 L
POST FEV1/FVC RATIO: 62 %
Post FEV6/FVC ratio: 96 %
Pre FEV1/FVC ratio: 63 %
Pre FEV6/FVC Ratio: 97 %
RV % PRED: 120 %
RV: 2.57 L
TLC % pred: 114 %
TLC: 7.57 L

## 2015-07-06 NOTE — Progress Notes (Signed)
PFT done today. 

## 2015-07-07 ENCOUNTER — Encounter: Payer: Self-pay | Admitting: Internal Medicine

## 2015-07-13 ENCOUNTER — Ambulatory Visit (AMBULATORY_SURGERY_CENTER): Payer: Self-pay

## 2015-07-13 ENCOUNTER — Telehealth: Payer: Self-pay

## 2015-07-13 VITALS — Ht 68.0 in | Wt 165.4 lb

## 2015-07-13 DIAGNOSIS — Z800 Family history of malignant neoplasm of digestive organs: Secondary | ICD-10-CM

## 2015-07-13 MED ORDER — NA SULFATE-K SULFATE-MG SULF 17.5-3.13-1.6 GM/177ML PO SOLN: ORAL | Status: DC

## 2015-07-13 NOTE — Progress Notes (Signed)
Per pt, no allergies to soy or egg products.Pt not taking any weight loss meds or using  O2 at home.  Pt came into the office today for his pre-visit prior to his colonoscopy with Dr Havery Moros on 10/18.Pt states he had a heart attack in August 2016 and did not have any symptoms.He had a EKG in 09/16 which was abnormal and a nuclear stress test, but does not know the results.I informed the pt we  need to get medical clearance from Dr Ronnald Ramp prior to his colonoscopy.Pt understood. I spoke with Tamra at Dr Ronnald Ramp office regarding the results of the nuclear stress test.Due to the pt having a MI and not being aware of this,  the pt would need to be cleared by a cardiologist and have Dr Ronnald Ramp review the  stress test.Dr Ronnald Ramp was out of the office this week.Left a message for the pt his colon on 07/27/15 was cancelled and to call our office if he had any questions.

## 2015-07-13 NOTE — Telephone Encounter (Signed)
Thayer Headings with GI (phone number [phone]) called to ask if patient should be cleared medically for colonoscopy scheduled for 10/18---dr jones is currently out of office---but I did find under imaging, the nuclear stress test indicating that patient has had prior infarct (not sure when), and it is a low risk study---but I told janice if she is needing an MD to clear patient for procedure, it would be best to wait for dr Ronnald Ramp to return and get medical clearance from him---patients colonoscopy is routine, no emergency---janice is to call patient to reschedule colonoscopy for later date after she gets medical clearance from dr Ronnald Ramp....routing to dr Adriana Simas

## 2015-07-14 ENCOUNTER — Encounter: Payer: BLUE CROSS/BLUE SHIELD | Admitting: Gastroenterology

## 2015-07-17 NOTE — Telephone Encounter (Signed)
I recommend that his heart be evaluated by cardiology prior to colonoscopy

## 2015-07-27 ENCOUNTER — Encounter: Payer: BLUE CROSS/BLUE SHIELD | Admitting: Gastroenterology

## 2015-07-28 ENCOUNTER — Telehealth: Payer: Self-pay | Admitting: Internal Medicine

## 2015-07-28 NOTE — Telephone Encounter (Signed)
Pt called in and said that he would like to speak nurse because he has a few questions before he calls the cardiologist and makes an appt,

## 2015-07-28 NOTE — Telephone Encounter (Signed)
Called GI back to inform that dr Ronnald Ramp wants patient cleared by his cardiologist---looks like patient has not been seen by cardiologist since his heart attack in aug/2016---also looks like patient needs stress test/follow up with cardiologist---GI is to contact patient to advise that cardiologist needs to see him before colonoscopy can be completed

## 2015-07-29 NOTE — Telephone Encounter (Signed)
Noted will wait for second phone call.

## 2015-07-29 NOTE — Telephone Encounter (Signed)
FYI patient states he wants to hold off on making a cardiologist appointment until he gets a check from his 401K account.  Once he gets this he will call back to get Dr. Ronnald Ramp to enter the referral to cardiologist.

## 2015-08-23 ENCOUNTER — Other Ambulatory Visit: Payer: Self-pay | Admitting: Internal Medicine

## 2015-08-23 ENCOUNTER — Telehealth: Payer: Self-pay | Admitting: Internal Medicine

## 2015-08-23 DIAGNOSIS — R972 Elevated prostate specific antigen [PSA]: Secondary | ICD-10-CM | POA: Insufficient documentation

## 2015-08-23 DIAGNOSIS — E538 Deficiency of other specified B group vitamins: Secondary | ICD-10-CM

## 2015-08-23 NOTE — Telephone Encounter (Signed)
Oscar Phillips - please ask him to come in and go down to the lab for a PSA recheck, I have already ordered the PSA

## 2015-08-23 NOTE — Telephone Encounter (Signed)
Was able to leave msg on vm to call back

## 2015-08-23 NOTE — Telephone Encounter (Signed)
Patient called back.  He will go back to lab.  He would like to know if Dr. Ronnald Ramp is still wanting him to come into the office for Vit B 12 injection.  Patient states he is currently taking b12 supplements.

## 2015-08-23 NOTE — Telephone Encounter (Signed)
1st attempt voicemail box not set up

## 2015-08-24 NOTE — Telephone Encounter (Signed)
Pt requesting b12 level to be checked again to see if tablet he has been taking is increasing his numnber. If not then he will consider another means of b12

## 2015-09-27 ENCOUNTER — Encounter: Payer: Self-pay | Admitting: Internal Medicine

## 2015-11-11 ENCOUNTER — Other Ambulatory Visit: Payer: Self-pay | Admitting: Internal Medicine

## 2015-11-11 DIAGNOSIS — E785 Hyperlipidemia, unspecified: Secondary | ICD-10-CM

## 2015-11-11 DIAGNOSIS — R972 Elevated prostate specific antigen [PSA]: Secondary | ICD-10-CM

## 2015-11-19 ENCOUNTER — Encounter: Payer: Self-pay | Admitting: Internal Medicine

## 2016-05-24 ENCOUNTER — Other Ambulatory Visit (INDEPENDENT_AMBULATORY_CARE_PROVIDER_SITE_OTHER): Payer: 59

## 2016-05-24 ENCOUNTER — Encounter: Payer: Self-pay | Admitting: Internal Medicine

## 2016-05-24 ENCOUNTER — Ambulatory Visit (INDEPENDENT_AMBULATORY_CARE_PROVIDER_SITE_OTHER): Payer: 59 | Admitting: Internal Medicine

## 2016-05-24 VITALS — BP 140/78 | HR 90 | Temp 98.1°F | Resp 16 | Ht 68.0 in | Wt 165.2 lb

## 2016-05-24 DIAGNOSIS — Z0000 Encounter for general adult medical examination without abnormal findings: Secondary | ICD-10-CM

## 2016-05-24 DIAGNOSIS — E538 Deficiency of other specified B group vitamins: Secondary | ICD-10-CM

## 2016-05-24 DIAGNOSIS — E785 Hyperlipidemia, unspecified: Secondary | ICD-10-CM

## 2016-05-24 DIAGNOSIS — Z1159 Encounter for screening for other viral diseases: Secondary | ICD-10-CM

## 2016-05-24 DIAGNOSIS — I10 Essential (primary) hypertension: Secondary | ICD-10-CM | POA: Diagnosis not present

## 2016-05-24 DIAGNOSIS — Z23 Encounter for immunization: Secondary | ICD-10-CM | POA: Diagnosis not present

## 2016-05-24 DIAGNOSIS — J449 Chronic obstructive pulmonary disease, unspecified: Secondary | ICD-10-CM | POA: Diagnosis not present

## 2016-05-24 DIAGNOSIS — R0989 Other specified symptoms and signs involving the circulatory and respiratory systems: Secondary | ICD-10-CM

## 2016-05-24 DIAGNOSIS — A5903 Trichomonal cystitis and urethritis: Secondary | ICD-10-CM

## 2016-05-24 DIAGNOSIS — Z1211 Encounter for screening for malignant neoplasm of colon: Secondary | ICD-10-CM

## 2016-05-24 DIAGNOSIS — R05 Cough: Secondary | ICD-10-CM | POA: Diagnosis not present

## 2016-05-24 DIAGNOSIS — R059 Cough, unspecified: Secondary | ICD-10-CM

## 2016-05-24 LAB — URINALYSIS, ROUTINE W REFLEX MICROSCOPIC
Bilirubin Urine: NEGATIVE
Leukocytes, UA: NEGATIVE
Nitrite: NEGATIVE
Specific Gravity, Urine: <=1.005 — AB (ref 1.000–1.030)
Total Protein, Urine: NEGATIVE
Urine Glucose: NEGATIVE
Urobilinogen, UA: 0.2 (ref 0.0–1.0)
pH: 6 (ref 5.0–8.0)

## 2016-05-24 LAB — COMPREHENSIVE METABOLIC PANEL
ALBUMIN: 4.7 g/dL (ref 3.5–5.2)
ALT: 45 U/L (ref 0–53)
AST: 52 U/L — AB (ref 0–37)
Alkaline Phosphatase: 63 U/L (ref 39–117)
BUN: 13 mg/dL (ref 6–23)
CHLORIDE: 92 meq/L — AB (ref 96–112)
CO2: 27 mEq/L (ref 19–32)
CREATININE: 0.96 mg/dL (ref 0.40–1.50)
Calcium: 9.7 mg/dL (ref 8.4–10.5)
GFR: 84.7 mL/min (ref >60.00)
Glucose, Bld: 118 mg/dL — ABNORMAL HIGH (ref 70–99)
Potassium: 4.5 mEq/L (ref 3.5–5.1)
SODIUM: 129 meq/L — AB (ref 135–145)
Total Bilirubin: 0.8 mg/dL (ref 0.2–1.2)
Total Protein: 7.3 g/dL (ref 6.0–8.3)

## 2016-05-24 LAB — CBC WITH DIFFERENTIAL/PLATELET
BASOS PCT: 0.4 % (ref 0.0–3.0)
Basophils Absolute: 0 10*3/uL (ref 0.0–0.1)
EOS ABS: 0 10*3/uL (ref 0.0–0.7)
Eosinophils Relative: 0.2 % (ref 0.0–5.0)
HCT: 49.4 % (ref 39.0–52.0)
HEMOGLOBIN: 17.3 g/dL — AB (ref 13.0–17.0)
Lymphocytes Relative: 18.2 % (ref 12.0–46.0)
Lymphs Abs: 1.4 10*3/uL (ref 0.7–4.0)
MCHC: 34.9 g/dL (ref 30.0–36.0)
MCV: 91.4 fl (ref 78.0–100.0)
MONO ABS: 0.8 10*3/uL (ref 0.1–1.0)
Monocytes Relative: 9.6 % (ref 3.0–12.0)
NEUTROS ABS: 5.7 10*3/uL (ref 1.4–7.7)
Neutrophils Relative %: 71.6 % (ref 43.0–77.0)
PLATELETS: 285 10*3/uL (ref 150.0–400.0)
RBC: 5.4 Mil/uL (ref 4.22–5.81)
RDW: 13.7 % (ref 11.5–15.5)
WBC: 7.9 10*3/uL (ref 4.0–10.5)

## 2016-05-24 LAB — LIPID PANEL
CHOLESTEROL: 183 mg/dL (ref 0–200)
HDL: 53.3 mg/dL (ref >39.00)
LDL CALC: 107 mg/dL — AB (ref 0–99)
NonHDL: 130.04
Total CHOL/HDL Ratio: 3
Triglycerides: 117 mg/dL (ref 0.0–149.0)
VLDL: 23.4 mg/dL (ref 0.0–40.0)

## 2016-05-24 LAB — VITAMIN B12: VITAMIN B 12: 435 pg/mL (ref 211–911)

## 2016-05-24 LAB — TSH: TSH: 1.17 u[IU]/mL (ref 0.35–4.50)

## 2016-05-24 LAB — PSA: PSA: 4.72 ng/mL — ABNORMAL HIGH (ref 0.10–4.00)

## 2016-05-24 LAB — FOLATE: FOLATE: 10.1 ng/mL (ref >5.9)

## 2016-05-24 LAB — FECAL OCCULT BLOOD, GUAIAC: Fecal Occult Blood: NEGATIVE

## 2016-05-24 MED ORDER — ATORVASTATIN CALCIUM 40 MG PO TABS: 40.0000 mg | ORAL_TABLET | Freq: Every day | ORAL | 3 refills | Status: DC

## 2016-05-24 MED ORDER — CYANOCOBALAMIN 500 MCG/0.1ML NA SOLN: 0.1000 mL | NASAL | 11 refills | Status: DC

## 2016-05-24 MED ORDER — METRONIDAZOLE 500 MG PO TABS: 2000.0000 mg | ORAL_TABLET | Freq: Once | ORAL | 0 refills | Status: AC

## 2016-05-24 MED ORDER — AZILSARTAN MEDOXOMIL 80 MG PO TABS: 1.0000 | ORAL_TABLET | Freq: Every day | ORAL | 3 refills | Status: DC

## 2016-05-24 MED ORDER — UMECLIDINIUM BROMIDE 62.5 MCG/INH IN AEPB: 1.0000 | INHALATION_SPRAY | Freq: Every day | RESPIRATORY_TRACT | 3 refills | Status: DC

## 2016-05-24 NOTE — Patient Instructions (Signed)

## 2016-05-24 NOTE — Progress Notes (Signed)
Pre visit review using our clinic review tool, if applicable. No additional management support is needed unless otherwise documented below in the visit note. 

## 2016-05-24 NOTE — Progress Notes (Signed)
Subjective:  Patient ID: Oscar Phillips, male    DOB: 1956-03-22  Age: 60 y.o. MRN: [id number]  CC: Hypertension; Hyperlipidemia; and Annual Exam   HPI Aksil Reczek presents for a CPX.  He is due for a blood pressure check. He tells me he thinks his blood pressure has been well controlled. He has had no episodes of headache/blurred vision/chest pain/palpitations/or edema.  He has a chronic/persistent cough that is occasionally productive of clear phlegm. He is not using the INCRUSE inhaler. He does continue to smoke. He denies shortness of breath, hemoptysis, or wheezing.  He has a history of an abnormal EKG and a year ago he had a normal Lexiscan done. He has had no recent episodes of chest pain or diaphoresis.  Outpatient Medications Prior to Visit  Medication Sig Dispense Refill  . ALPRAZolam (XANAX) 1 MG tablet 4 (four) times daily.   0  . aspirin EC 81 MG tablet Take 1 tablet (81 mg total) by mouth daily. 90 tablet 1  . cyanocobalamin 100 MCG tablet Take 100 mcg by mouth daily.    Marland Kitchen imipramine (TOFRANIL) 50 MG tablet   1  . Na Sulfate-K Sulfate-Mg Sulf (SUPREP BOWEL PREP) SOLN Suprep as directed / no substitutions 354 mL 0  . atorvastatin (LIPITOR) 40 MG tablet Take 1 tablet (40 mg total) by mouth daily. (Patient not taking: Reported on 07/13/2015) 90 tablet 3  . Azilsartan Medoxomil (EDARBI) 80 MG TABS Take 1 tablet (80 mg total) by mouth daily. 30 tablet 11  . Cyanocobalamin 500 MCG/0.1ML SOLN Place 0.1 mLs (500 mcg total) into the nose once a week. (Patient not taking: Reported on 07/13/2015) 2.3 mL 11  . Umeclidinium Bromide (INCRUSE ELLIPTA) 62.5 MCG/INH AEPB Inhale 1 puff into the lungs daily. (Patient not taking: Reported on 07/13/2015) 30 each 11   Facility-Administered Medications Prior to Visit  Medication Dose Route Frequency Provider Last Rate Last Dose  . cyanocobalamin ((VITAMIN B-12)) injection 1,000 mcg  1,000 mcg Intramuscular Q30 days Janith Lima, MD    1,000 mcg at 05/25/15 0805    ROS Review of Systems  Constitutional: Positive for fatigue. Negative for activity change, appetite change, chills, diaphoresis, fever and unexpected weight change.  HENT: Negative.  Negative for trouble swallowing.   Eyes: Negative.  Negative for visual disturbance.  Respiratory: Positive for cough. Negative for apnea, choking, chest tightness, shortness of breath, wheezing and stridor.   Cardiovascular: Negative.  Negative for chest pain, palpitations and leg swelling.  Gastrointestinal: Negative.  Negative for abdominal pain, blood in stool, constipation, diarrhea, nausea and vomiting.  Endocrine: Negative.   Genitourinary: Negative.  Negative for difficulty urinating, discharge, dysuria, flank pain, frequency, hematuria, penile pain, penile swelling, scrotal swelling, testicular pain and urgency.  Musculoskeletal: Negative.  Negative for arthralgias, back pain, myalgias and neck pain.  Skin: Negative.  Negative for color change and rash.  Allergic/Immunologic: Negative.   Neurological: Negative.  Negative for dizziness, tremors, weakness, numbness and headaches.  Hematological: Negative.  Negative for adenopathy. Does not bruise/bleed easily.  Psychiatric/Behavioral: Negative.     Objective:  BP 140/78 (BP Location: Left Arm, Patient Position: Sitting, Cuff Size: Normal)   Pulse (!) 116   Temp 98.1 F (36.7 C) (Oral)   Ht 5\' 8"  (1.727 m)   Wt 165 lb 4 oz (75 kg)   SpO2 96%   BMI 25.13 kg/m   BP Readings from Last 3 Encounters:  05/24/16 140/78  05/20/15 (!) 160/94  01/01/14 132/74  Wt Readings from Last 3 Encounters:  05/24/16 165 lb 4 oz (75 kg)  07/13/15 165 lb 6.4 oz (75 kg)  06/01/15 164 lb (74.4 kg)    Physical Exam  Constitutional: He is oriented to person, place, and time. He appears well-developed and well-nourished. No distress.  HENT:  Head: Normocephalic and atraumatic.  Mouth/Throat: Oropharynx is clear and moist. No  oropharyngeal exudate.  Eyes: Conjunctivae are normal. Right eye exhibits no discharge. Left eye exhibits no discharge. No scleral icterus.  Neck: Normal range of motion. Neck supple. No JVD present. No tracheal deviation present. No thyromegaly present.  Cardiovascular: Normal rate, regular rhythm, normal heart sounds and intact distal pulses.  Exam reveals no gallop and no friction rub.   No murmur heard. EKG --- Sinus  Tachycardia  -Right bundle branch block with left axis -bifascicular block.   -Right atrial enlargement.   ABNORMAL - no change from prior EKG   Pulmonary/Chest: Effort normal and breath sounds normal. No stridor. No respiratory distress. He has no wheezes. He has no rales. He exhibits no tenderness.  Abdominal: Soft. Normal appearance and bowel sounds are normal. He exhibits abdominal bruit. He exhibits no shifting dullness, no distension, no pulsatile liver, no fluid wave, no ascites, no pulsatile midline mass and no mass. There is no splenomegaly or hepatomegaly. There is no tenderness. There is no rebound and no guarding. Hernia confirmed negative in the ventral area, confirmed negative in the right inguinal area and confirmed negative in the left inguinal area.  Genitourinary: Rectum normal, testes normal and penis normal. Rectal exam shows no external hemorrhoid, no internal hemorrhoid, no fissure, no mass, no tenderness, anal tone normal and guaiac negative stool. Prostate is enlarged. Prostate is not tender. Right testis shows no mass, no swelling and no tenderness. Right testis is descended. Left testis shows no mass, no swelling and no tenderness. Left testis is descended. Circumcised. No penile erythema or penile tenderness. No discharge found.  Genitourinary Comments: He has 2+ BPH with the right lobe being slightly larger and slightly boggy when compared to the left lobe  Musculoskeletal: Normal range of motion. He exhibits no edema, tenderness or deformity.    Lymphadenopathy:    He has no cervical adenopathy.       Right: No inguinal adenopathy present.       Left: No inguinal adenopathy present.  Neurological: He is alert and oriented to person, place, and time. He has normal reflexes. He displays normal reflexes. No cranial nerve deficit. He exhibits normal muscle tone. Coordination normal.  Skin: Skin is warm and dry. No rash noted. He is not diaphoretic. No erythema. No pallor.  Psychiatric: He has a normal mood and affect. His behavior is normal. Judgment and thought content normal.  Vitals reviewed.   Lab Results  Component Value Date   WBC 7.9 05/24/2016   HGB 17.3 (H) 05/24/2016   HCT 49.4 05/24/2016   PLT 285.0 05/24/2016   GLUCOSE 118 (H) 05/24/2016   CHOL 183 05/24/2016   TRIG 117.0 05/24/2016   HDL 53.30 05/24/2016   LDLDIRECT 154.1 06/03/2010   LDLCALC 107 (H) 05/24/2016   ALT 45 05/24/2016   AST 52 (H) 05/24/2016   NA 129 (L) 05/24/2016   K 4.5 05/24/2016   CL 92 (L) 05/24/2016   CREATININE 0.96 05/24/2016   BUN 13 05/24/2016   CO2 27 05/24/2016   TSH 1.17 05/24/2016   PSA 4.72 (H) 05/24/2016   INR 1.1 ratio (H) 08/15/2010  HGBA1C 5.7 05/20/2015    Ct Elbow Left W/o Cm  Result Date: 06/30/2015 CLINICAL DATA:  History of left elbow fracture secondary to a fall 06/16/2015. Pain. Initial encounter. EXAM: 3-DIMENSIONAL CT IMAGE RENDERING ON INDEPENDENT WORKSTATION; CT OF THE LEFT ELBOW WITHOUT CONTRAST TECHNIQUE: Multidetector CT imaging was performed according to the standard protocol. Multiplanar CT image reconstructions were also generated. COMPARISON:  None. FINDINGS: The patient has a fracture through the proximal ulna. The fracture is transverse in orientation and centered approximately 4.5 cm distal to the tip of the olecranon. The proximal fracture fragment is retracted by the triceps with the fracture line oriented in an predominantly posterior direction. The fracture is mildly comminuted. The patient also has a  mildly impacted fracture of the neck of the right radius. The fracture does not disrupt the articular surface of the radius. The humerus is intact. Hematoma and soft tissue swelling are seen about the elbow. IMPRESSION: Proximal ulna and radial neck fractures as described above. 3-dimensional CT images were rendered by post-processing of the original CT data at the CT scanner. The 3-dimensional CT images were interpreted, and findings were reported in the accompanying complete CT report for this study. Electronically Signed   By: Inge Rise M.D.   On: 06/30/2015 09:02   Ct 3d Independent Wkst  Result Date: 06/30/2015 CLINICAL DATA:  History of left elbow fracture secondary to a fall 06/16/2015. Pain. Initial encounter. EXAM: 3-DIMENSIONAL CT IMAGE RENDERING ON INDEPENDENT WORKSTATION; CT OF THE LEFT ELBOW WITHOUT CONTRAST TECHNIQUE: Multidetector CT imaging was performed according to the standard protocol. Multiplanar CT image reconstructions were also generated. COMPARISON:  None. FINDINGS: The patient has a fracture through the proximal ulna. The fracture is transverse in orientation and centered approximately 4.5 cm distal to the tip of the olecranon. The proximal fracture fragment is retracted by the triceps with the fracture line oriented in an predominantly posterior direction. The fracture is mildly comminuted. The patient also has a mildly impacted fracture of the neck of the right radius. The fracture does not disrupt the articular surface of the radius. The humerus is intact. Hematoma and soft tissue swelling are seen about the elbow. IMPRESSION: Proximal ulna and radial neck fractures as described above. 3-dimensional CT images were rendered by post-processing of the original CT data at the CT scanner. The 3-dimensional CT images were interpreted, and findings were reported in the accompanying complete CT report for this study. Electronically Signed   By: Inge Rise M.D.   On: 06/30/2015  09:02    Assessment & Plan:   Kimari was seen today for hypertension, hyperlipidemia and annual exam.  Diagnoses and all orders for this visit:  Routine general medical examination at a health care facility- Exam completed, labs ordered and will be reviewed, vaccines reviewed and updated, he was referred again for colonoscopy, patient education material was given. -     Lipid panel; Future -     Comprehensive metabolic panel; Future -     CBC with Differential/Platelet; Future -     PSA; Future -     TSH; Future -     Urinalysis, Routine w reflex microscopic (not at [facility]); Future -     HIV antibody; Future  Need for hepatitis C screening test -     Hepatitis C antibody; Future  B12 deficiency- improvement noted, will continue B12 replacement therapy. -     Vitamin B12; Future -     Folate; Future -  Cyanocobalamin 500 MCG/0.1ML SOLN; Place 0.1 mLs (500 mcg total) into the nose once a week.  Essential hypertension- his blood pressure is well-controlled, electrolytes and renal function are stable. -     Azilsartan Medoxomil (EDARBI) 80 MG TABS; Take 1 tablet (80 mg total) by mouth daily.  Hyperlipidemia with target LDL less than 100- he has achieved his LDL goal is doing well on statin therapy. -     atorvastatin (LIPITOR) 40 MG tablet; Take 1 tablet (40 mg total) by mouth daily.  Colon cancer screening -     Ambulatory referral to Gastroenterology  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 exacerbation ([place])- he was asked to quit smoking, will restart INCRUSE to reduce his symptoms. -     umeclidinium bromide (INCRUSE ELLIPTA) 62.5 MCG/INH AEPB; Inhale 1 puff into the lungs daily.  Need for prophylactic vaccination against Streptococcus pneumoniae (pneumococcus) -     Pneumococcal polysaccharide vaccine 23-valent greater than or equal to 2yo subcutaneous/IM  Cough- will recheck his chest x-ray to screen for pneumonia/mass. 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 -     metroNIDAZOLE (FLAGYL) 500 MG tablet; Take 4 tablets (2,000 mg total) by mouth once.   I am having Mr. Kauffmann start on metroNIDAZOLE. I am also having him maintain his ALPRAZolam, imipramine, aspirin EC, cyanocobalamin, Na Sulfate-K Sulfate-Mg Sulf, Azilsartan Medoxomil, Cyanocobalamin, atorvastatin, and umeclidinium bromide. We will continue to administer cyanocobalamin.  Meds ordered this encounter  Medications  . Azilsartan Medoxomil (EDARBI) 80 MG TABS    Sig: Take 1 tablet (80 mg total) by mouth daily.    Dispense:  90 tablet    Refill:  3  . Cyanocobalamin 500 MCG/0.1ML SOLN    Sig: Place 0.1 mLs (500 mcg total) into the nose once a week.    Dispense:  2.3 mL    Refill:  11  . atorvastatin (LIPITOR) 40 MG tablet    Sig: Take 1 tablet (40 mg total) by mouth daily.    Dispense:  90 tablet    Refill:  3  . umeclidinium bromide (INCRUSE ELLIPTA) 62.5 MCG/INH AEPB    Sig: Inhale 1 puff into the lungs daily.    Dispense:  90 each    Refill:  3  . metroNIDAZOLE (FLAGYL) 500 MG tablet    Sig: Take 4 tablets (2,000 mg total) by mouth once.    Dispense:  4 tablet    Refill:  0     Follow-up: Return in about 3 months (around 08/24/2016).  Scarlette Calico, MD

## 2016-05-25 ENCOUNTER — Encounter: Payer: Self-pay | Admitting: Internal Medicine

## 2016-05-25 ENCOUNTER — Telehealth: Payer: Self-pay

## 2016-05-25 LAB — HIV ANTIBODY (ROUTINE TESTING W REFLEX): HIV: NONREACTIVE

## 2016-05-25 LAB — HEPATITIS C ANTIBODY: HCV Ab: NEGATIVE

## 2016-05-25 NOTE — Telephone Encounter (Signed)
Pt called and wanted to know about the abdominal aneurysm and referral to heart care.   Pt informed the reason for the Vas [place] and that heart care will be scheduling the imaging.

## 2016-05-26 ENCOUNTER — Telehealth: Payer: Self-pay | Admitting: Internal Medicine

## 2016-05-26 DIAGNOSIS — R0989 Other specified symptoms and signs involving the circulatory and respiratory systems: Secondary | ICD-10-CM

## 2016-05-26 NOTE — Telephone Encounter (Signed)
Please follow up with patient.  He is very confused on what he needs to be doing and he also does not want to talk to [facility].

## 2016-05-26 NOTE — Telephone Encounter (Signed)
Patient is requesting call back as to referral to specialist for his lab results.

## 2016-05-26 NOTE — Telephone Encounter (Signed)
Spoke with Stef and she states there is an order for ultrasound to Oscar Phillips.  [facility] states they do not have anything for patient.  Patient wants to just make an appointment for whatever he needs to be seen for

## 2016-05-26 NOTE — Telephone Encounter (Signed)
Have tried calling patient back but phone number is not going through.  Patient has called several times in regard but will not stay on hold long enough for me to find any information out.

## 2016-05-29 ENCOUNTER — Other Ambulatory Visit: Payer: Self-pay | Admitting: Internal Medicine

## 2016-05-29 ENCOUNTER — Telehealth: Payer: Self-pay | Admitting: *Deleted

## 2016-05-29 DIAGNOSIS — E538 Deficiency of other specified B group vitamins: Secondary | ICD-10-CM

## 2016-05-29 MED ORDER — CYANOCOBALAMIN 2000 MCG PO TABS: 2000.0000 ug | ORAL_TABLET | Freq: Every day | ORAL | 3 refills | Status: DC

## 2016-05-29 NOTE — Telephone Encounter (Signed)
LVM for pt to call back as soon as possible.   

## 2016-05-29 NOTE — Telephone Encounter (Signed)
changed

## 2016-05-29 NOTE — Telephone Encounter (Signed)
Rec'd fax stating that the Nascobal Nasal spray copay is too high pt requesting alternative...Johny Chess

## 2016-05-30 NOTE — Telephone Encounter (Signed)
Pt call back want to speak to the assistant today if possible. Pt need to know what to do about this abdominal aneurysm that he might have? Do he need to have the test done or what to do. Please call pt back at [phone]. Spoke with [facility], no order in system other than GI referral.

## 2016-05-30 NOTE — Telephone Encounter (Signed)
Left message with Edmonia Lynch at [facility]) Imaging department.

## 2016-05-30 NOTE — Telephone Encounter (Signed)
Pt informed I will find out who has the orders.

## 2016-05-30 NOTE — Telephone Encounter (Signed)
Patient called back about all this. He states he had some questions. And You had called him again yesterday 8/21 and left him a VM to call you back. Can you please follow up with him, Thank you.

## 2016-05-30 NOTE — Telephone Encounter (Signed)
Oscar Phillips contacted [facility] and scheduled pt for [place].  Reordered VAS.   Left detailed message for pt with time and day. (08/31 at 11am) - fasting; meds with water only.

## 2016-06-08 ENCOUNTER — Ambulatory Visit (HOSPITAL_COMMUNITY)
Admission: RE | Admit: 2016-06-08 | Discharge: 2016-06-08 | Disposition: A | Discharge status: Home / Self Care | Payer: 59 | Source: Ambulatory Visit | Attending: Cardiology | Admitting: Cardiology

## 2016-06-08 DIAGNOSIS — R0989 Other specified symptoms and signs involving the circulatory and respiratory systems: Secondary | ICD-10-CM | POA: Insufficient documentation

## 2016-06-08 DIAGNOSIS — I700 Atherosclerosis of aorta: Secondary | ICD-10-CM | POA: Insufficient documentation

## 2016-06-08 DIAGNOSIS — I708 Atherosclerosis of other arteries: Secondary | ICD-10-CM | POA: Insufficient documentation

## 2016-06-10 ENCOUNTER — Encounter: Payer: Self-pay | Admitting: Internal Medicine

## 2016-07-20 ENCOUNTER — Ambulatory Visit: Payer: BLUE CROSS/BLUE SHIELD | Admitting: Gastroenterology

## 2016-07-21 ENCOUNTER — Encounter (INDEPENDENT_AMBULATORY_CARE_PROVIDER_SITE_OTHER): Payer: Self-pay

## 2016-07-21 ENCOUNTER — Ambulatory Visit (INDEPENDENT_AMBULATORY_CARE_PROVIDER_SITE_OTHER): Payer: 59 | Admitting: Gastroenterology

## 2016-07-21 ENCOUNTER — Encounter: Payer: Self-pay | Admitting: Gastroenterology

## 2016-07-21 VITALS — BP 132/70 | HR 100 | Ht 67.0 in | Wt 167.4 lb

## 2016-07-21 DIAGNOSIS — Z800 Family history of malignant neoplasm of digestive organs: Secondary | ICD-10-CM

## 2016-07-21 DIAGNOSIS — K5901 Slow transit constipation: Secondary | ICD-10-CM | POA: Diagnosis not present

## 2016-07-21 MED ORDER — NA SULFATE-K SULFATE-MG SULF 17.5-3.13-1.6 GM/177ML PO SOLN: 1.0000 | Freq: Once | ORAL | 0 refills | Status: AC
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
## 2016-07-21 NOTE — Patient Instructions (Signed)
If you are age 60 or older, your body mass index should be between 23-30. Your Body mass index is 26.21 kg/m. If this is out of the aforementioned range listed, please consider follow up with your Primary Care Provider.  If you are age 69 or younger, your body mass index should be between 19-25. Your Body mass index is 26.21 kg/m. If this is out of the aformentioned range listed, please consider follow up with your Primary Care Provider.   You have been scheduled for a colonoscopy. Please follow written instructions given to you at your visit today.  Please pick up your prep supplies at the pharmacy within the next 1-3 days. If you use inhalers (even only as needed), please bring them with you on the day of your procedure. Your physician has requested that you go to www.startemmi.com and enter the access code given to you at your visit today. This web site gives a general overview about your procedure. However, you should still follow specific instructions given to you by our office regarding your preparation for the procedure.  Thank you for choosing [facility]  Dr Wilfrid Lund III

## 2016-08-08 ENCOUNTER — Ambulatory Visit: Payer: BLUE CROSS/BLUE SHIELD | Admitting: Gastroenterology

## 2016-08-21 ENCOUNTER — Encounter: Payer: 59 | Admitting: Gastroenterology

## 2016-09-12 ENCOUNTER — Ambulatory Visit (INDEPENDENT_AMBULATORY_CARE_PROVIDER_SITE_OTHER): Payer: 59 | Admitting: Nurse Practitioner

## 2016-09-12 ENCOUNTER — Encounter: Payer: Self-pay | Admitting: Nurse Practitioner

## 2016-09-12 ENCOUNTER — Ambulatory Visit (INDEPENDENT_AMBULATORY_CARE_PROVIDER_SITE_OTHER)
Admission: RE | Admit: 2016-09-12 | Discharge: 2016-09-12 | Disposition: A | Discharge status: Home / Self Care | Payer: 59 | Source: Ambulatory Visit | Attending: Nurse Practitioner | Admitting: Nurse Practitioner

## 2016-09-12 VITALS — BP 132/76 | HR 127 | Temp 99.8°F | Ht 68.0 in | Wt 164.0 lb

## 2016-09-12 DIAGNOSIS — R0989 Other specified symptoms and signs involving the circulatory and respiratory systems: Secondary | ICD-10-CM | POA: Diagnosis not present

## 2016-09-12 DIAGNOSIS — J181 Lobar pneumonia, unspecified organism: Secondary | ICD-10-CM | POA: Diagnosis not present

## 2016-09-12 DIAGNOSIS — R0600 Dyspnea, unspecified: Secondary | ICD-10-CM

## 2016-09-12 DIAGNOSIS — R0609 Other forms of dyspnea: Secondary | ICD-10-CM

## 2016-09-12 DIAGNOSIS — R000 Tachycardia, unspecified: Secondary | ICD-10-CM | POA: Diagnosis not present

## 2016-09-12 DIAGNOSIS — J189 Pneumonia, unspecified organism: Secondary | ICD-10-CM

## 2016-09-12 MED ORDER — ALBUTEROL SULFATE HFA 108 (90 BASE) MCG/ACT IN AERS: 2.0000 | INHALATION_SPRAY | Freq: Four times a day (QID) | RESPIRATORY_TRACT | 0 refills | Status: DC | PRN

## 2016-09-12 MED ORDER — DM-GUAIFENESIN ER 30-600 MG PO TB12
1.0000 | ORAL_TABLET | Freq: Two times a day (BID) | ORAL | 0 refills | Status: DC | PRN
Start: 2016-09-12 — End: 2018-03-04

## 2016-09-12 MED ORDER — IPRATROPIUM-ALBUTEROL 0.5-2.5 (3) MG/3ML IN SOLN
3.0000 mL | Freq: Once | RESPIRATORY_TRACT | Status: AC
  Administered 2016-09-12: 3 mL via RESPIRATORY_TRACT

## 2016-09-12 MED ORDER — LEVOFLOXACIN 750 MG PO TABS: 750.0000 mg | ORAL_TABLET | Freq: Every day | ORAL | 0 refills | Status: AC

## 2016-09-12 MED ORDER — BENZONATATE 100 MG PO CAPS: 100.0000 mg | ORAL_CAPSULE | Freq: Three times a day (TID) | ORAL | 0 refills | Status: DC | PRN

## 2016-09-12 NOTE — Progress Notes (Signed)
Pre visit review using our clinic review tool, if applicable. No additional management support is needed unless otherwise documented below in the visit note. 

## 2016-09-12 NOTE — Patient Instructions (Signed)
You will be called with CXR results.  URI Instructions: Flonase and Afrin use: apply 1spray of afrin in each nare, wait 35mins, then apply 2sprays of flonase in each nare. Use both nasal spray consecutively x 3days, then flonase only for at least 14days.  Encourage adequate oral hydration.  Use over-the-counter  "cold" medicines  such as "Tylenol cold" , "Advil cold",  "Mucinex" or" Mucinex D"  for cough and congestion.  Avoid decongestants if you have high blood pressure. Use" Delsym" or" Robitussin" cough syrup varietis for cough.  You can use plain "Tylenol" or "Advi"l for fever, chills and achyness.   "Common cold" symptoms are usually triggered by a virus.  The antibiotics are usually not necessary. On average, a" viral cold" illness would take 4-7 days to resolve. Please, make an appointment if you are not better or if you're worse.

## 2016-09-12 NOTE — Progress Notes (Signed)
Subjective:  Patient ID: Oscar Phillips, male    DOB: 1955-11-24  Age: 60 y.o. MRN: [id number]  CC: Cough (cough yellow mucus at times,congestion,night sweat,bodyache for 4 days. took Vit C. )   Cough  This is a new problem. The current episode started in the past 7 days. The problem has been rapidly worsening. The problem occurs constantly. The cough is productive of purulent sputum. Associated symptoms include chest pain, chills, headaches, myalgias, nasal congestion, postnasal drip, rhinorrhea, shortness of breath and wheezing. The symptoms are aggravated by lying down. He has tried OTC cough suppressant for the symptoms. The treatment provided no relief. His past medical history is significant for COPD.    Outpatient Medications Prior to Visit  Medication Sig Dispense Refill  . ALPRAZolam (XANAX) 1 MG tablet 4 (four) times daily.   0  . aspirin EC 81 MG tablet Take 1 tablet (81 mg total) by mouth daily. 90 tablet 1  . Azilsartan Medoxomil (EDARBI) 80 MG TABS Take 1 tablet (80 mg total) by mouth daily. 90 tablet 3  . cyanocobalamin 2000 MCG tablet Take 1 tablet (2,000 mcg total) by mouth daily. 90 tablet 3  . imipramine (TOFRANIL) 50 MG tablet   1   Facility-Administered Medications Prior to Visit  Medication Dose Route Frequency Provider Last Rate Last Dose  . cyanocobalamin ((VITAMIN B-12)) injection 1,000 mcg  1,000 mcg Intramuscular Q30 days Janith Lima, MD   1,000 mcg at 05/25/15 0805    ROS See HPI  Objective:  BP 132/76   Pulse (!) 127   Temp 99.8 F (37.7 C)   Ht 5\' 8"  (1.727 m)   Wt 164 lb (74.4 kg)   SpO2 98%   BMI 24.94 kg/m   BP Readings from Last 3 Encounters:  09/12/16 132/76  07/21/16 132/70  05/24/16 140/78    Wt Readings from Last 3 Encounters:  09/12/16 164 lb (74.4 kg)  07/21/16 167 lb 6 oz (75.9 kg)  05/24/16 165 lb 4 oz (75 kg)    Physical Exam  Constitutional: He is oriented to person, place, and time. No distress.  HENT:  Right Ear:  Tympanic membrane, external ear and ear canal normal.  Left Ear: Tympanic membrane and ear canal normal.  Nose: Mucosal edema and rhinorrhea present. Right sinus exhibits maxillary sinus tenderness and frontal sinus tenderness. Left sinus exhibits maxillary sinus tenderness and frontal sinus tenderness.  Mouth/Throat: Uvula is midline. Posterior oropharyngeal erythema present. No oropharyngeal exudate.  Eyes: No scleral icterus.  Neck: Normal range of motion. Neck supple.  Cardiovascular: Normal rate and regular rhythm.   Pulmonary/Chest: Effort normal. He has wheezes. He has rales.  Musculoskeletal: He exhibits no edema.  Lymphadenopathy:    He has cervical adenopathy.  Neurological: He is alert and oriented to person, place, and time.  Vitals reviewed.   Lab Results  Component Value Date   WBC 7.9 05/24/2016   HGB 17.3 (H) 05/24/2016   HCT 49.4 05/24/2016   PLT 285.0 05/24/2016   GLUCOSE 118 (H) 05/24/2016   CHOL 183 05/24/2016   TRIG 117.0 05/24/2016   HDL 53.30 05/24/2016   LDLDIRECT 154.1 06/03/2010   LDLCALC 107 (H) 05/24/2016   ALT 45 05/24/2016   AST 52 (H) 05/24/2016   NA 129 (L) 05/24/2016   K 4.5 05/24/2016   CL 92 (L) 05/24/2016   CREATININE 0.96 05/24/2016   BUN 13 05/24/2016   CO2 27 05/24/2016   TSH 1.17 05/24/2016   PSA 4.72 (  H) 05/24/2016   INR 1.1 ratio (H) 08/15/2010   HGBA1C 5.7 05/20/2015   ECG: sinus tachycardia with RBBB. No change compared to 05/2015 ECG.  Nuclear stress test done 05/2015: normal with EF of 63%..  No results found.  Assessment & Plan:   Wain was seen today for cough.  Diagnoses and all orders for this visit:  Community acquired pneumonia of left lower lobe of lung ([place]) -     DG Chest 2 View; Future -     levofloxacin (LEVAQUIN) 750 MG tablet; Take 1 tablet (750 mg total) by mouth daily. -     albuterol (PROVENTIL HFA;VENTOLIN HFA) 108 (90 Base) MCG/ACT inhaler; Inhale 2 puffs into the lungs every 6 (six) hours as  needed for wheezing or shortness of breath.  Dyspnea on exertion -     ipratropium-albuterol (DUONEB) 0.5-2.5 (3) MG/3ML nebulizer solution 3 mL; Take 3 mLs by nebulization once. -     DG Chest 2 View; Future -     dextromethorphan-guaiFENesin (MUCINEX DM) 30-600 MG 12hr tablet; Take 1 tablet by mouth 2 (two) times daily as needed for cough. -     benzonatate (TESSALON) 100 MG capsule; Take 1 capsule (100 mg total) by mouth 3 (three) times daily as needed for cough. -     levofloxacin (LEVAQUIN) 750 MG tablet; Take 1 tablet (750 mg total) by mouth daily. -     albuterol (PROVENTIL HFA;VENTOLIN HFA) 108 (90 Base) MCG/ACT inhaler; Inhale 2 puffs into the lungs every 6 (six) hours as needed for wheezing or shortness of breath.  Tachycardia -     EKG 12-Lead -     levofloxacin (LEVAQUIN) 750 MG tablet; Take 1 tablet (750 mg total) by mouth daily. -     albuterol (PROVENTIL HFA;VENTOLIN HFA) 108 (90 Base) MCG/ACT inhaler; Inhale 2 puffs into the lungs every 6 (six) hours as needed for wheezing or shortness of breath.   I have discontinued Mr. Seman FLUCELVAX QUADRIVALENT. I am also having him start on dextromethorphan-guaiFENesin, benzonatate, levofloxacin, and albuterol. Additionally, I am having him maintain his ALPRAZolam, imipramine, aspirin EC, Azilsartan Medoxomil, and cyanocobalamin. We administered ipratropium-albuterol. We will continue to administer cyanocobalamin.  Meds ordered this encounter  Medications  . DISCONTD: FLUCELVAX QUADRIVALENT 0.5 ML SUSY    Refill:  0  . ipratropium-albuterol (DUONEB) 0.5-2.5 (3) MG/3ML nebulizer solution 3 mL  . dextromethorphan-guaiFENesin (MUCINEX DM) 30-600 MG 12hr tablet    Sig: Take 1 tablet by mouth 2 (two) times daily as needed for cough.    Dispense:  14 tablet    Refill:  0    Order Specific Question:   Supervising Provider    Answer:   Cassandria Anger [[id number]]  . benzonatate (TESSALON) 100 MG capsule    Sig: Take 1 capsule (100 mg  total) by mouth 3 (three) times daily as needed for cough.    Dispense:  20 capsule    Refill:  0    Order Specific Question:   Supervising Provider    Answer:   Cassandria Anger [[id number]]  . levofloxacin (LEVAQUIN) 750 MG tablet    Sig: Take 1 tablet (750 mg total) by mouth daily.    Dispense:  7 tablet    Refill:  0    Order Specific Question:   Supervising Provider    Answer:   Cassandria Anger [[id number]]  . albuterol (PROVENTIL HFA;VENTOLIN HFA) 108 (90 Base) MCG/ACT inhaler    Sig: Inhale 2 puffs into  the lungs every 6 (six) hours as needed for wheezing or shortness of breath.    Dispense:  1 Inhaler    Refill:  0    Order Specific Question:   Supervising Provider    Answer:   Cassandria Anger [[id number]]    Follow-up: Return in about 3 days (around 09/15/2016) for pneumonia follow up.  Wilfred Lacy, NP

## 2016-09-14 ENCOUNTER — Encounter: Payer: Self-pay | Admitting: Gastroenterology

## 2016-09-15 ENCOUNTER — Encounter: Payer: Self-pay | Admitting: Nurse Practitioner

## 2016-09-15 ENCOUNTER — Ambulatory Visit (INDEPENDENT_AMBULATORY_CARE_PROVIDER_SITE_OTHER): Payer: 59 | Admitting: Nurse Practitioner

## 2016-09-15 ENCOUNTER — Other Ambulatory Visit (INDEPENDENT_AMBULATORY_CARE_PROVIDER_SITE_OTHER): Payer: 59

## 2016-09-15 VITALS — BP 104/58 | HR 104 | Temp 97.4°F | Ht 68.0 in | Wt 161.0 lb

## 2016-09-15 DIAGNOSIS — J449 Chronic obstructive pulmonary disease, unspecified: Secondary | ICD-10-CM

## 2016-09-15 DIAGNOSIS — J181 Lobar pneumonia, unspecified organism: Secondary | ICD-10-CM

## 2016-09-15 DIAGNOSIS — J189 Pneumonia, unspecified organism: Secondary | ICD-10-CM

## 2016-09-15 LAB — CBC WITH DIFFERENTIAL/PLATELET
BASOS ABS: 0 10*3/uL (ref 0.0–0.1)
Basophils Relative: 0.3 % (ref 0.0–3.0)
Eosinophils Absolute: 0 10*3/uL (ref 0.0–0.7)
Eosinophils Relative: 0.5 % (ref 0.0–5.0)
HCT: 42.3 % (ref 39.0–52.0)
Hemoglobin: 14.8 g/dL (ref 13.0–17.0)
LYMPHS ABS: 2.1 10*3/uL (ref 0.7–4.0)
Lymphocytes Relative: 23.6 % (ref 12.0–46.0)
MCHC: 34.8 g/dL (ref 30.0–36.0)
MCV: 91.1 fl (ref 78.0–100.0)
MONOS PCT: 12.8 % — AB (ref 3.0–12.0)
Monocytes Absolute: 1.2 10*3/uL — ABNORMAL HIGH (ref 0.1–1.0)
NEUTROS ABS: 5.6 10*3/uL (ref 1.4–7.7)
NEUTROS PCT: 62.8 % (ref 43.0–77.0)
PLATELETS: 280 10*3/uL (ref 150.0–400.0)
RBC: 4.65 Mil/uL (ref 4.22–5.81)
RDW: 13.6 % (ref 11.5–15.5)
WBC: 9 10*3/uL (ref 4.0–10.5)

## 2016-09-15 LAB — BASIC METABOLIC PANEL
BUN: 10 mg/dL (ref 6–23)
CALCIUM: 8.9 mg/dL (ref 8.4–10.5)
CO2: 28 meq/L (ref 19–32)
CREATININE: 0.92 mg/dL (ref 0.40–1.50)
Chloride: 90 mEq/L — ABNORMAL LOW (ref 96–112)
GFR: 88.87 mL/min (ref >60.00)
GLUCOSE: 115 mg/dL — AB (ref 70–99)
Potassium: 4.6 mEq/L (ref 3.5–5.1)
Sodium: 128 mEq/L — ABNORMAL LOW (ref 135–145)

## 2016-09-15 NOTE — Progress Notes (Signed)
Pre visit review using our clinic review tool, if applicable. No additional management support is needed unless otherwise documented below in the visit note. 

## 2016-09-15 NOTE — Progress Notes (Signed)
Subjective:  Patient ID: Oscar Phillips, male    DOB: [birth year]  Age: 60 y.o. MRN: [id number]  CC: Follow-up (f/u pneumonia/extream tired. still coughing at times,not eatting too good)   Cough  The cough is productive of purulent sputum. Associated symptoms include chest pain and myalgias. Pertinent negatives include no chills, fever or shortness of breath. The symptoms are aggravated by lying down. Risk factors for lung disease include smoking/tobacco exposure. Treatments tried: use of benzonantate, mucinex, levaquin and albuterol. The treatment provided mild relief. His past medical history is significant for COPD.    Outpatient Medications Prior to Visit  Medication Sig Dispense Refill  . albuterol (PROVENTIL HFA;VENTOLIN HFA) 108 (90 Base) MCG/ACT inhaler Inhale 2 puffs into the lungs every 6 (six) hours as needed for wheezing or shortness of breath. 1 Inhaler 0  . ALPRAZolam (XANAX) 1 MG tablet 4 (four) times daily.   0  . aspirin EC 81 MG tablet Take 1 tablet (81 mg total) by mouth daily. 90 tablet 1  . Azilsartan Medoxomil (EDARBI) 80 MG TABS Take 1 tablet (80 mg total) by mouth daily. 90 tablet 3  . benzonatate (TESSALON) 100 MG capsule Take 1 capsule (100 mg total) by mouth 3 (three) times daily as needed for cough. 20 capsule 0  . cyanocobalamin 2000 MCG tablet Take 1 tablet (2,000 mcg total) by mouth daily. 90 tablet 3  . dextromethorphan-guaiFENesin (MUCINEX DM) 30-600 MG 12hr tablet Take 1 tablet by mouth 2 (two) times daily as needed for cough. 14 tablet 0  . imipramine (TOFRANIL) 50 MG tablet   1  . levofloxacin (LEVAQUIN) 750 MG tablet Take 1 tablet (750 mg total) by mouth daily. 7 tablet 0   Facility-Administered Medications Prior to Visit  Medication Dose Route Frequency Provider Last Rate Last Dose  . cyanocobalamin ((VITAMIN B-12)) injection 1,000 mcg  1,000 mcg Intramuscular Q30 days Janith Lima, MD   1,000 mcg at 05/25/15 0805    ROS See HPI  Objective:    BP (!) 104/58   Pulse (!) 104   Temp 97.4 F (36.3 C)   Ht 5\' 8"  (1.727 m)   Wt 161 lb (73 kg)   SpO2 96%   BMI 24.48 kg/m   BP Readings from Last 3 Encounters:  09/15/16 (!) 104/58  09/12/16 132/76  07/21/16 132/70    Wt Readings from Last 3 Encounters:  09/15/16 161 lb (73 kg)  09/12/16 164 lb (74.4 kg)  07/21/16 167 lb 6 oz (75.9 kg)    Physical Exam  Constitutional: He is oriented to person, place, and time. No distress.  Eyes: No scleral icterus.  Neck: Normal range of motion. Neck supple.  Cardiovascular: Normal rate and normal heart sounds.   Pulmonary/Chest: No respiratory distress. He has wheezes. He has no rales.  Abdominal: Soft. Bowel sounds are normal.  Musculoskeletal: He exhibits no edema.  Neurological: He is alert and oriented to person, place, and time.  Skin: Skin is warm and dry.  Vitals reviewed.   Lab Results  Component Value Date   WBC 9.0 09/15/2016   HGB 14.8 09/15/2016   HCT 42.3 09/15/2016   PLT 280.0 09/15/2016   GLUCOSE 115 (H) 09/15/2016   CHOL 183 05/24/2016   TRIG 117.0 05/24/2016   HDL 53.30 05/24/2016   LDLDIRECT 154.1 06/03/2010   LDLCALC 107 (H) 05/24/2016   ALT 45 05/24/2016   AST 52 (H) 05/24/2016   NA 128 (L) 09/15/2016   K 4.6 09/15/2016  CL 90 (L) 09/15/2016   CREATININE 0.92 09/15/2016   BUN 10 09/15/2016   CO2 28 09/15/2016   TSH 1.17 05/24/2016   PSA 4.72 (H) 05/24/2016   INR 1.1 ratio (H) 08/15/2010   HGBA1C 5.7 05/20/2015    Dg Chest 2 View  Result Date: 09/12/2016 CLINICAL DATA:  Severe cough, congestion, and shortness of breath for the past week associated with low-grade fever. History of COPD, current smoker. EXAM: CHEST  2 VIEW COMPARISON:  Chest x-ray of May 20, 2015 FINDINGS: The lungs remain hyperinflated. The interstitial markings are slightly more conspicuous overall today. Patchy confluent density in the left lower lobe has developed. The heart and pulmonary vascularity are normal. There is  calcification in the wall of the aortic arch. The mediastinum is normal in width. There is no pleural effusion. The bony thorax is unremarkable. IMPRESSION: Acute left lower lobe pneumonia superimposed upon COPD. No CHF. Followup PA and lateral chest X-ray is recommended in 3-4 weeks following trial of antibiotic therapy to ensure resolution and exclude underlying malignancy. Thoracic aortic atherosclerosis. Electronically Signed   By: David  Martinique M.D.   On: 09/12/2016 09:34    Assessment & Plan:   Oscar Phillips was seen today for follow-up.  Diagnoses and all orders for this visit:  Community acquired pneumonia of left lower lobe of lung ([place]) -     CBC w/Diff; Future -     Basic Metabolic Panel (BMET); Future  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 exacerbation ([facility]) -     CBC w/Diff; Future -     Basic Metabolic Panel (BMET); Future   I am having Oscar Phillips maintain his ALPRAZolam, imipramine, aspirin EC, Azilsartan Medoxomil, cyanocobalamin, dextromethorphan-guaiFENesin, benzonatate, levofloxacin, and albuterol. We will continue to administer cyanocobalamin.  No orders of the defined types were placed in this encounter.  Recent Results (from the past 2160 hour(s))  CBC w/Diff     Status: Abnormal   Collection Time: 09/15/16  9:27 AM  Result Value Ref Range   WBC 9.0 4.0 - 10.5 K/uL   RBC 4.65 4.22 - 5.81 Mil/uL   Hemoglobin 14.8 13.0 - 17.0 g/dL   HCT 42.3 39.0 - 52.0 %   MCV 91.1 78.0 - 100.0 fl   MCHC 34.8 30.0 - 36.0 g/dL   RDW 13.6 11.5 - 15.5 %   Platelets 280.0 150.0 - 400.0 K/uL   Neutrophils Relative % 62.8 43.0 - 77.0 %   Lymphocytes Relative 23.6 12.0 - 46.0 %   Monocytes Relative 12.8 (H) 3.0 - 12.0 %   Eosinophils Relative 0.5 0.0 - 5.0 %   Basophils Relative 0.3 0.0 - 3.0 %   Neutro Abs 5.6 1.4 - 7.7 K/uL   Lymphs Abs 2.1 0.7 - 4.0 K/uL   Monocytes Absolute 1.2 (H) 0.1 - 1.0 K/uL   Eosinophils Absolute 0.0 0.0 - 0.7 K/uL   Basophils Absolute 0.0 0.0 - 0.1  K/uL  Basic Metabolic Panel (BMET)     Status: Abnormal   Collection Time: 09/15/16  9:27 AM  Result Value Ref Range   Sodium 128 (L) 135 - 145 mEq/L   Potassium 4.6 3.5 - 5.1 mEq/L   Chloride 90 (L) 96 - 112 mEq/L   CO2 28 19 - 32 mEq/L   Glucose, Bld 115 (H) 70 - 99 mg/dL   BUN 10 6 - 23 mg/dL   Creatinine, Ser 0.92 0.40 - 1.50 mg/dL   Calcium 8.9 8.4 - 10.5 mg/dL   GFR 88.87 >60.00 mL/min  Follow-up: Return if symptoms worsen or fail to improve.  Wilfred Lacy, NP

## 2016-09-15 NOTE — Patient Instructions (Addendum)
Encourage adequate oral hydration (water and gartorade) and food intake.  Advised to quit smoking.  Complete oral abx treatment as prescribed Use albuterol inhaler twice a day x 3days, then switch to as needed.

## 2016-09-20 ENCOUNTER — Telehealth: Payer: Self-pay | Admitting: Internal Medicine

## 2016-09-20 NOTE — Telephone Encounter (Signed)
Patient called to advise that he finished his abx on Monday. He states that he is improved but still having some chest congestion and SOB. He is asking for advice on how to proceed. please call patient,

## 2016-09-20 NOTE — Telephone Encounter (Signed)
Please advise 

## 2016-09-20 NOTE — Telephone Encounter (Signed)
Please make follow up appt with pulmonologist.

## 2016-09-21 NOTE — Telephone Encounter (Signed)
Left vm for pt to callback 

## 2016-09-21 NOTE — Telephone Encounter (Signed)
Patient called back. I informed him of the notes. He is going to give it a couple more days because he is doing better. The cold is not bad. He just wanted to really know if he should take another round of abx. By the time we was done talking. He said he would just wait a couple more days if he's not better he will see his pulmonologist.

## 2016-09-22 ENCOUNTER — Encounter: Payer: 59 | Admitting: Gastroenterology

## 2016-11-23 ENCOUNTER — Other Ambulatory Visit: Payer: Self-pay | Admitting: Internal Medicine

## 2016-11-23 DIAGNOSIS — I10 Essential (primary) hypertension: Secondary | ICD-10-CM

## 2016-11-23 MED ORDER — TELMISARTAN 40 MG PO TABS: 40.0000 mg | ORAL_TABLET | Freq: Every day | ORAL | 0 refills | Status: DC

## 2017-01-15 DIAGNOSIS — F410 Panic disorder [episodic paroxysmal anxiety] without agoraphobia: Secondary | ICD-10-CM | POA: Diagnosis not present

## 2017-04-19 ENCOUNTER — Ambulatory Visit: Payer: 59 | Admitting: Family

## 2017-04-20 ENCOUNTER — Ambulatory Visit (INDEPENDENT_AMBULATORY_CARE_PROVIDER_SITE_OTHER): Payer: BLUE CROSS/BLUE SHIELD | Admitting: Internal Medicine

## 2017-04-20 ENCOUNTER — Ambulatory Visit (INDEPENDENT_AMBULATORY_CARE_PROVIDER_SITE_OTHER)
Admission: RE | Admit: 2017-04-20 | Discharge: 2017-04-20 | Disposition: A | Discharge status: Home / Self Care | Payer: BLUE CROSS/BLUE SHIELD | Source: Ambulatory Visit | Attending: Internal Medicine | Admitting: Internal Medicine

## 2017-04-20 ENCOUNTER — Encounter: Payer: Self-pay | Admitting: Internal Medicine

## 2017-04-20 VITALS — BP 136/82 | HR 86 | Ht 68.0 in | Wt 165.0 lb

## 2017-04-20 DIAGNOSIS — R0609 Other forms of dyspnea: Secondary | ICD-10-CM | POA: Diagnosis not present

## 2017-04-20 DIAGNOSIS — J441 Chronic obstructive pulmonary disease with (acute) exacerbation: Secondary | ICD-10-CM

## 2017-04-20 DIAGNOSIS — I10 Essential (primary) hypertension: Secondary | ICD-10-CM

## 2017-04-20 DIAGNOSIS — F411 Generalized anxiety disorder: Secondary | ICD-10-CM | POA: Diagnosis not present

## 2017-04-20 DIAGNOSIS — J449 Chronic obstructive pulmonary disease, unspecified: Secondary | ICD-10-CM | POA: Diagnosis not present

## 2017-04-20 DIAGNOSIS — R0600 Dyspnea, unspecified: Secondary | ICD-10-CM

## 2017-04-20 MED ORDER — HYDROCODONE-HOMATROPINE 5-1.5 MG/5ML PO SYRP: 5.0000 mL | ORAL_SOLUTION | Freq: Four times a day (QID) | ORAL | 0 refills | Status: AC | PRN

## 2017-04-20 MED ORDER — VALSARTAN 160 MG PO TABS: 160.0000 mg | ORAL_TABLET | Freq: Every day | ORAL | 3 refills | Status: DC

## 2017-04-20 MED ORDER — ALBUTEROL SULFATE HFA 108 (90 BASE) MCG/ACT IN AERS: 2.0000 | INHALATION_SPRAY | Freq: Four times a day (QID) | RESPIRATORY_TRACT | 2 refills | Status: DC | PRN

## 2017-04-20 MED ORDER — LEVOFLOXACIN 500 MG PO TABS: 500.0000 mg | ORAL_TABLET | Freq: Every day | ORAL | 0 refills | Status: AC

## 2017-04-20 MED ORDER — PREDNISONE 10 MG PO TABS: ORAL_TABLET | ORAL | 0 refills | Status: DC

## 2017-04-20 NOTE — Patient Instructions (Addendum)
Please take all new medication as prescribed - the antibiotic, cough medicine if needed, and the  Prednisone  OK to chane the Edarbi to Diovan 160 mg per day  Please continue all other medications as before, and refills have been done for the Albuterol inhaler  Please have the pharmacy call with any other refills you may need.  Please keep your appointments with your specialists as you may have planned  Please go to the XRAY Department in the Basement (go straight as you get off the elevator) for the x-ray testing  You will be contacted by phone if any changes need to be made immediately.  Otherwise, you will receive a letter about your results with an explanation, but please check with MyChart first.  Please remember to sign up for MyChart if you have not done so, as this will be important to you in the future with finding out test results, communicating by private email, and scheduling acute appointments online when needed.

## 2017-04-22 ENCOUNTER — Encounter: Payer: Self-pay | Admitting: Internal Medicine

## 2017-04-22 DIAGNOSIS — F429 Obsessive-compulsive disorder, unspecified: Secondary | ICD-10-CM | POA: Insufficient documentation

## 2017-04-22 NOTE — Assessment & Plan Note (Signed)
D/w pt to f/u with psychiatry regarding anxiety/depression and substance abuse; urged to abstain from etoh and tobacco

## 2017-04-22 NOTE — Progress Notes (Signed)
Subjective:    Patient ID: Oscar Phillips, male    DOB: [birth year], 61 y.o.   MRN: [id number]  HPI Here with acute onset mild to mod 2-3 days ST, HA, general weakness and malaise, with prod cough greenish sputum, but Pt denies chest pain, increased sob or doe, wheezing, orthopnea, PND, increased LE swelling, palpitations, dizziness or syncope, except for onset mild wheezing, sob since last PM.  Denies worsening depressive symptoms, suicidal ideation, or panic; but has ongoing anxiety/OCD symptoms, chronic persistently increased recently, states he realizes he drinks ETOH > 6 beers per day, and smokes too much at > 1 ppd, is homosexual (not clear why this mentioned) and sees psychiatry regularly.  Pt denies new neurological symptoms such as new headache, or facial or extremity weakness or numbness  Also states Earnest Rosier is simply too expensive and asks for change. Past Medical History:  Diagnosis Date  . Alcoholism ([place])   . Anxiety   . Broken arm    left arm/has metal splint/06/16/15  . COPD (chronic obstructive pulmonary disease) ([place])   . History of panic attacks   . History of rectal bleeding    in August 2016  . HLD (hyperlipidemia)   . HTN (hypertension)   . Myocardial infarction ([place])    Pt unsure/no symptoms in 05/2015   Past Surgical History:  Procedure Laterality Date  . ELBOW FRACTURE SURGERY Left   . LUMBAR DISC SURGERY    . TONSILLECTOMY  1965    reports that he has been smoking Cigarettes.  He has a 25.00 pack-year smoking history. He has never used smokeless tobacco. He reports that he drinks about 9.0 oz of alcohol per week . He reports that he does not use drugs. family history includes Alcohol abuse in his maternal uncle; Colon cancer in his father; Dementia in his mother; Emphysema in his paternal grandmother; Heart failure in his paternal grandmother; Hyperlipidemia in his father; Lung cancer in his maternal uncle; Stomach cancer in his father. Allergies  Allergen  Reactions  . Penicillins     REACTION: Childhood reaction   Current Outpatient Prescriptions on File Prior to Visit  Medication Sig Dispense Refill  . ALPRAZolam (XANAX) 1 MG tablet 4 (four) times daily.   0  . aspirin EC 81 MG tablet Take 1 tablet (81 mg total) by mouth daily. 90 tablet 1  . cyanocobalamin 2000 MCG tablet Take 1 tablet (2,000 mcg total) by mouth daily. 90 tablet 3  . dextromethorphan-guaiFENesin (MUCINEX DM) 30-600 MG 12hr tablet Take 1 tablet by mouth 2 (two) times daily as needed for cough. 14 tablet 0  . imipramine (TOFRANIL) 50 MG tablet   1  . telmisartan (MICARDIS) 40 MG tablet Take 1 tablet (40 mg total) by mouth daily. 90 tablet 0   Current Facility-Administered Medications on File Prior to Visit  Medication Dose Route Frequency Provider Last Rate Last Dose  . cyanocobalamin ((VITAMIN B-12)) injection 1,000 mcg  1,000 mcg Intramuscular Q30 days Janith Lima, MD   1,000 mcg at 05/25/15 0805   Review of Systems  Constitutional: Negative for other unusual diaphoresis or sweats HENT: Negative for ear discharge or swelling Eyes: Negative for other worsening visual disturbances Respiratory: Negative for stridor or other swelling  Gastrointestinal: Negative for worsening distension or other blood Genitourinary: Negative for retention or other urinary change Musculoskeletal: Negative for other MSK pain or swelling Skin: Negative for color change or other new lesions Neurological: Negative for worsening tremors and other numbness  Psychiatric/Behavioral: Negative for worsening agitation or other fatigue All other system neg per pt    Objective:   Physical Exam BP 136/82   Pulse 86   Ht 5\' 8"  (1.727 m)   Wt 165 lb (74.8 kg)   SpO2 99%   BMI 25.09 kg/m  VS noted, somewhat disheveled, mild ill Constitutional: Pt appears in NAD HENT: Head: NCAT.  Right Ear: External ear normal.  Left Ear: External ear normal.  Eyes: . Pupils are equal, round, and reactive  to light. Conjunctivae and EOM are normal Bilat tm's with mild erythema.  Max sinus areas non tender.  Pharynx with mild erythema, no exudate Nose: without d/c or deformity Neck: Neck supple. Gross normal ROM Cardiovascular: Normal rate and regular rhythm.   Pulmonary/Chest: Effort normal and breath sounds without rales but with mild bilat few scattered wheezing.  Neurological: Pt is alert. At baseline orientation, motor grossly intact Skin: Skin is warm. No rashes, other new lesions, no LE edema Psychiatric: Pt behavior is normal without agitation, 1-2+ nervous depressed  No other exam findings    Assessment & Plan:

## 2017-04-22 NOTE — Assessment & Plan Note (Signed)
Mild to mod, for antibx course, cough med prn, predpac asd, cxr, and albuterol MDI prn,  to f/u any worsening symptoms or concerns

## 2017-04-22 NOTE — Assessment & Plan Note (Signed)
BP Readings from Last 3 Encounters:  04/20/17 136/82  09/15/16 (!) 104/58  09/12/16 132/76  overall stable but will change edarbi to diovan 160 qd per pt request for less expensive med, cont to f/u BP at h0ome and next visit

## 2017-06-29 ENCOUNTER — Ambulatory Visit (INDEPENDENT_AMBULATORY_CARE_PROVIDER_SITE_OTHER): Payer: BLUE CROSS/BLUE SHIELD | Admitting: Psychology

## 2017-06-29 DIAGNOSIS — F331 Major depressive disorder, recurrent, moderate: Secondary | ICD-10-CM

## 2017-07-05 DIAGNOSIS — F410 Panic disorder [episodic paroxysmal anxiety] without agoraphobia: Secondary | ICD-10-CM | POA: Diagnosis not present

## 2017-12-27 DIAGNOSIS — F410 Panic disorder [episodic paroxysmal anxiety] without agoraphobia: Secondary | ICD-10-CM | POA: Diagnosis not present

## 2017-12-27 DIAGNOSIS — F4321 Adjustment disorder with depressed mood: Secondary | ICD-10-CM | POA: Diagnosis not present

## 2018-03-02 ENCOUNTER — Observation Stay (HOSPITAL_COMMUNITY): Payer: BLUE CROSS/BLUE SHIELD

## 2018-03-02 ENCOUNTER — Observation Stay (HOSPITAL_COMMUNITY)
Admission: EM | Admit: 2018-03-02 | Discharge: 2018-03-04 | Disposition: A | Discharge status: Home / Self Care | Payer: BLUE CROSS/BLUE SHIELD | Attending: Internal Medicine | Admitting: Internal Medicine

## 2018-03-02 ENCOUNTER — Encounter (HOSPITAL_COMMUNITY): Payer: Self-pay

## 2018-03-02 ENCOUNTER — Other Ambulatory Visit: Payer: Self-pay

## 2018-03-02 DIAGNOSIS — F17200 Nicotine dependence, unspecified, uncomplicated: Secondary | ICD-10-CM | POA: Diagnosis present

## 2018-03-02 DIAGNOSIS — Z7982 Long term (current) use of aspirin: Secondary | ICD-10-CM | POA: Diagnosis not present

## 2018-03-02 DIAGNOSIS — Y906 Blood alcohol level of 120-199 mg/100 ml: Secondary | ICD-10-CM | POA: Diagnosis not present

## 2018-03-02 DIAGNOSIS — F1010 Alcohol abuse, uncomplicated: Secondary | ICD-10-CM | POA: Diagnosis not present

## 2018-03-02 DIAGNOSIS — Z79899 Other long term (current) drug therapy: Secondary | ICD-10-CM | POA: Insufficient documentation

## 2018-03-02 DIAGNOSIS — F10239 Alcohol dependence with withdrawal, unspecified: Principal | ICD-10-CM | POA: Insufficient documentation

## 2018-03-02 DIAGNOSIS — E785 Hyperlipidemia, unspecified: Secondary | ICD-10-CM | POA: Diagnosis present

## 2018-03-02 DIAGNOSIS — J449 Chronic obstructive pulmonary disease, unspecified: Secondary | ICD-10-CM | POA: Insufficient documentation

## 2018-03-02 DIAGNOSIS — Z880 Allergy status to penicillin: Secondary | ICD-10-CM | POA: Diagnosis not present

## 2018-03-02 DIAGNOSIS — K760 Fatty (change of) liver, not elsewhere classified: Secondary | ICD-10-CM | POA: Insufficient documentation

## 2018-03-02 DIAGNOSIS — I10 Essential (primary) hypertension: Secondary | ICD-10-CM | POA: Diagnosis present

## 2018-03-02 DIAGNOSIS — R000 Tachycardia, unspecified: Secondary | ICD-10-CM | POA: Diagnosis not present

## 2018-03-02 DIAGNOSIS — I159 Secondary hypertension, unspecified: Secondary | ICD-10-CM

## 2018-03-02 DIAGNOSIS — R0609 Other forms of dyspnea: Secondary | ICD-10-CM

## 2018-03-02 DIAGNOSIS — R748 Abnormal levels of other serum enzymes: Secondary | ICD-10-CM | POA: Diagnosis not present

## 2018-03-02 DIAGNOSIS — F410 Panic disorder [episodic paroxysmal anxiety] without agoraphobia: Secondary | ICD-10-CM | POA: Diagnosis not present

## 2018-03-02 DIAGNOSIS — F419 Anxiety disorder, unspecified: Secondary | ICD-10-CM | POA: Diagnosis not present

## 2018-03-02 DIAGNOSIS — R0600 Dyspnea, unspecified: Secondary | ICD-10-CM

## 2018-03-02 DIAGNOSIS — Z23 Encounter for immunization: Secondary | ICD-10-CM | POA: Diagnosis not present

## 2018-03-02 DIAGNOSIS — E871 Hypo-osmolality and hyponatremia: Secondary | ICD-10-CM

## 2018-03-02 DIAGNOSIS — I700 Atherosclerosis of aorta: Secondary | ICD-10-CM | POA: Insufficient documentation

## 2018-03-02 DIAGNOSIS — F17210 Nicotine dependence, cigarettes, uncomplicated: Secondary | ICD-10-CM | POA: Diagnosis not present

## 2018-03-02 DIAGNOSIS — F411 Generalized anxiety disorder: Secondary | ICD-10-CM | POA: Diagnosis present

## 2018-03-02 DIAGNOSIS — J4489 Other specified chronic obstructive pulmonary disease: Secondary | ICD-10-CM | POA: Diagnosis present

## 2018-03-02 DIAGNOSIS — F10121 Alcohol abuse with intoxication delirium: Secondary | ICD-10-CM | POA: Diagnosis present

## 2018-03-02 HISTORY — DX: Gastro-esophageal reflux disease without esophagitis: K21.9

## 2018-03-02 HISTORY — DX: Elevated prostate specific antigen (PSA): R97.20

## 2018-03-02 HISTORY — DX: Personal history of other infectious and parasitic diseases: Z86.19

## 2018-03-02 HISTORY — DX: Other specified symptoms and signs involving the circulatory and respiratory systems: R09.89

## 2018-03-02 HISTORY — DX: Obsessive-compulsive disorder, unspecified: F42.9

## 2018-03-02 HISTORY — DX: Bronchitis, not specified as acute or chronic: J40

## 2018-03-02 HISTORY — DX: Deficiency of other specified B group vitamins: E53.8

## 2018-03-02 LAB — CBC
HCT: 46.9 % (ref 39.0–52.0)
HCT: 45.6 % (ref 39.0–52.0)
HEMOGLOBIN: 16.8 g/dL (ref 13.0–17.0)
Hemoglobin: 16.1 g/dL (ref 13.0–17.0)
MCH: 32.7 pg (ref 26.0–34.0)
MCH: 32.6 pg (ref 26.0–34.0)
MCHC: 35.8 g/dL (ref 30.0–36.0)
MCHC: 35.3 g/dL (ref 30.0–36.0)
MCV: 91.4 fL (ref 78.0–100.0)
MCV: 92.3 fL (ref 78.0–100.0)
Platelets: 195 10*3/uL (ref 150–400)
Platelets: 173 10*3/uL (ref 150–400)
RBC: 5.13 MIL/uL (ref 4.22–5.81)
RBC: 4.94 MIL/uL (ref 4.22–5.81)
RDW: 13.3 % (ref 11.5–15.5)
RDW: 13.4 % (ref 11.5–15.5)
WBC: 7.7 10*3/uL (ref 4.0–10.5)
WBC: 6.8 10*3/uL (ref 4.0–10.5)

## 2018-03-02 LAB — COMPREHENSIVE METABOLIC PANEL
ALBUMIN: 4.1 g/dL (ref 3.5–5.0)
ALT: 214 U/L — ABNORMAL HIGH (ref 17–63)
ANION GAP: 15 (ref 5–15)
AST: 221 U/L — ABNORMAL HIGH (ref 15–41)
Alkaline Phosphatase: 77 U/L (ref 38–126)
BILIRUBIN TOTAL: 1 mg/dL (ref 0.3–1.2)
BUN: 8 mg/dL (ref 6–20)
CO2: 20 mmol/L — ABNORMAL LOW (ref 22–32)
Calcium: 8.6 mg/dL — ABNORMAL LOW (ref 8.9–10.3)
Chloride: 91 mmol/L — ABNORMAL LOW (ref 101–111)
Creatinine, Ser: 0.81 mg/dL (ref 0.61–1.24)
GFR calc Af Amer: >60 mL/min (ref >60)
GFR calc non Af Amer: >60 mL/min (ref >60)
GLUCOSE: 144 mg/dL — AB (ref 65–99)
POTASSIUM: 4 mmol/L (ref 3.5–5.1)
Sodium: 126 mmol/L — ABNORMAL LOW (ref 135–145)
TOTAL PROTEIN: 7.7 g/dL (ref 6.5–8.1)

## 2018-03-02 LAB — I-STAT TROPONIN, ED: TROPONIN I, POC: 0 ng/mL (ref 0.00–0.08)

## 2018-03-02 LAB — RAPID URINE DRUG SCREEN, HOSP PERFORMED
AMPHETAMINES: NOT DETECTED
BENZODIAZEPINES: POSITIVE — AB
Barbiturates: NOT DETECTED
Cocaine: NOT DETECTED
OPIATES: NOT DETECTED
TETRAHYDROCANNABINOL: NOT DETECTED

## 2018-03-02 LAB — LIPID PANEL
CHOLESTEROL: 162 mg/dL (ref 0–200)
HDL: 83 mg/dL (ref >40)
LDL CALC: 56 mg/dL (ref 0–99)
TRIGLYCERIDES: 115 mg/dL (ref <150)
Total CHOL/HDL Ratio: 2 RATIO
VLDL: 23 mg/dL (ref 0–40)

## 2018-03-02 LAB — CREATININE, SERUM
Creatinine, Ser: 0.98 mg/dL (ref 0.61–1.24)
GFR calc Af Amer: >60 mL/min
GFR calc non Af Amer: >60 mL/min

## 2018-03-02 LAB — PROTIME-INR
INR: 0.91
Prothrombin Time: 12.2 s (ref 11.4–15.2)

## 2018-03-02 LAB — LIPASE, BLOOD: Lipase: 32 U/L (ref 11–51)

## 2018-03-02 LAB — ETHANOL: Alcohol, Ethyl (B): 127 mg/dL — ABNORMAL HIGH (ref <10)

## 2018-03-02 MED ORDER — IPRATROPIUM-ALBUTEROL 0.5-2.5 (3) MG/3ML IN SOLN: 3.0000 mL | Freq: Four times a day (QID) | RESPIRATORY_TRACT | Status: DC | PRN

## 2018-03-02 MED ORDER — IMIPRAMINE HCL 50 MG PO TABS
50.0000 mg | ORAL_TABLET | Freq: Every day | ORAL | Status: DC
  Administered 2018-03-02 – 2018-03-03 (×2): 50 mg via ORAL
  Filled 2018-03-02 (×2): qty 1

## 2018-03-02 MED ORDER — LORAZEPAM 2 MG/ML IJ SOLN
0.5000 mg | Freq: Once | INTRAMUSCULAR | Status: AC
  Administered 2018-03-02: 0.5 mg via INTRAVENOUS
  Filled 2018-03-02: qty 1

## 2018-03-02 MED ORDER — NICOTINE 21 MG/24HR TD PT24
21.0000 mg | MEDICATED_PATCH | Freq: Every day | TRANSDERMAL | Status: DC
  Administered 2018-03-02 – 2018-03-04 (×3): 21 mg via TRANSDERMAL
  Filled 2018-03-02 (×3): qty 1

## 2018-03-02 MED ORDER — THIAMINE HCL 100 MG/ML IJ SOLN
500.0000 mg | Freq: Once | INTRAVENOUS | Status: DC
  Filled 2018-03-02: qty 5

## 2018-03-02 MED ORDER — ENOXAPARIN SODIUM 40 MG/0.4ML ~~LOC~~ SOLN
40.0000 mg | SUBCUTANEOUS | Status: DC
  Administered 2018-03-02: 40 mg via SUBCUTANEOUS
  Filled 2018-03-02: qty 0.4

## 2018-03-02 MED ORDER — SODIUM CHLORIDE 0.9 % IV SOLN
INTRAVENOUS | Status: DC
  Administered 2018-03-02 – 2018-03-03 (×2): via INTRAVENOUS

## 2018-03-02 MED ORDER — ADULT MULTIVITAMIN W/MINERALS CH
1.0000 | ORAL_TABLET | Freq: Once | ORAL | Status: AC
  Administered 2018-03-02: 1 via ORAL
  Filled 2018-03-02: qty 1

## 2018-03-02 MED ORDER — FOLIC ACID 1 MG PO TABS
1.0000 mg | ORAL_TABLET | Freq: Once | ORAL | Status: AC
  Administered 2018-03-02: 1 mg via ORAL
  Filled 2018-03-02: qty 1

## 2018-03-02 MED ORDER — ONDANSETRON HCL 4 MG/2ML IJ SOLN
4.0000 mg | Freq: Once | INTRAMUSCULAR | Status: AC
  Administered 2018-03-02: 4 mg via INTRAVENOUS
  Filled 2018-03-02: qty 2

## 2018-03-02 MED ORDER — THIAMINE HCL 100 MG/ML IJ SOLN: 100.0000 mg | Freq: Every day | INTRAMUSCULAR | Status: DC

## 2018-03-02 MED ORDER — LABETALOL HCL 5 MG/ML IV SOLN
10.0000 mg | INTRAVENOUS | Status: DC | PRN
  Administered 2018-03-04: 10 mg via INTRAVENOUS
  Filled 2018-03-02 (×2): qty 4

## 2018-03-02 MED ORDER — LORAZEPAM 1 MG PO TABS: 0.0000 mg | ORAL_TABLET | Freq: Two times a day (BID) | ORAL | Status: DC

## 2018-03-02 MED ORDER — SODIUM CHLORIDE 0.9 % IV BOLUS
1000.0000 mL | Freq: Once | INTRAVENOUS | Status: AC
  Administered 2018-03-02: 1000 mL via INTRAVENOUS

## 2018-03-02 MED ORDER — IRBESARTAN 150 MG PO TABS
150.0000 mg | ORAL_TABLET | Freq: Every day | ORAL | Status: DC
  Administered 2018-03-02 – 2018-03-03 (×2): 150 mg via ORAL
  Filled 2018-03-02 (×2): qty 1

## 2018-03-02 MED ORDER — LORAZEPAM 2 MG/ML IJ SOLN
0.0000 mg | Freq: Four times a day (QID) | INTRAMUSCULAR | Status: DC
  Administered 2018-03-02: 1 mg via INTRAVENOUS
  Filled 2018-03-02: qty 1

## 2018-03-02 MED ORDER — LORAZEPAM 2 MG/ML IJ SOLN: 0.0000 mg | Freq: Two times a day (BID) | INTRAMUSCULAR | Status: DC

## 2018-03-02 MED ORDER — LORAZEPAM 1 MG PO TABS
0.0000 mg | ORAL_TABLET | Freq: Four times a day (QID) | ORAL | Status: DC
  Administered 2018-03-02 – 2018-03-04 (×6): 1 mg via ORAL
  Filled 2018-03-02 (×6): qty 1

## 2018-03-02 MED ORDER — VITAMIN B-1 100 MG PO TABS
100.0000 mg | ORAL_TABLET | Freq: Every day | ORAL | Status: DC
  Administered 2018-03-02 – 2018-03-04 (×3): 100 mg via ORAL
  Filled 2018-03-02 (×3): qty 1

## 2018-03-02 NOTE — ED Notes (Signed)
ED TO INPATIENT HANDOFF REPORT  Name/Age/Gender Oscar Phillips 62 y.o. male  Code Status    Code Status Orders  (From admission, onward)        Start     Ordered   03/02/18 1719  Full code  Continuous     03/02/18 1719    Code Status History    Date Active Date Inactive Code Status Order ID Comments User Context   03/02/2018 1434 03/02/2018 1719 Full Code [id number]  [facility] ED      Home/SNF/Other Home  Chief Complaint detox  Level of Care/Admitting Diagnosis ED Disposition    ED Disposition Condition [place]: [employer] [[id number]]  Level of Care: Telemetry [5]  Diagnosis: Alcohol abuse with intoxication delirium [facility]) [[id number]]  Admitting Physician: Shelly Coss [[id number]]  Attending Physician: Shelly Coss [[id number]]  PT Class (Do Not Modify): Observation [104]  PT Acc Code (Do Not Modify): Observation [10022]       Medical History Past Medical History:  Diagnosis Date  . Abdominal bruit   . Alcoholism ([place])   . Anxiety   . B12 deficiency   . Broken arm    left arm/has metal splint/06/16/15  . Bronchitis   . COPD (chronic obstructive pulmonary disease) ([place])   . GERD (gastroesophageal reflux disease)   . H/O trichomonal urethritis   . History of panic attacks   . History of rectal bleeding    in August 2016  . HLD (hyperlipidemia)   . HTN (hypertension)   . Myocardial infarction ([place])    Pt unsure/no symptoms in 05/2015  . OCD (obsessive compulsive disorder)   . PSA elevation     Allergies Allergies  Allergen Reactions  . Penicillins     Has patient had a PCN reaction causing immediate rash, facial/tongue/throat swelling, SOB or lightheadedness with hypotension: Yes Has patient had a PCN reaction causing severe rash involving mucus membranes or skin necrosis: No Has patient had a PCN reaction that required hospitalization: Unknown Has patient had a PCN reaction occurring within the  last 10 years: Unknown If all of the above answers are "NO", then may proceed with Cephalosporin use.     IV Location/Drains/Wounds Patient Lines/Drains/Airways Status   Active Line/Drains/Airways    Name:   Placement date:   Placement time:   Site:   Days:   Peripheral IV 03/02/18 Left Wrist   03/02/18    1514    Wrist   less than 1          Labs/Imaging Results for orders placed or performed during the hospital encounter of 03/02/18 (from the past 48 hour(s))  Comprehensive metabolic panel     Status: Abnormal   Collection Time: 03/02/18  2:06 PM  Result Value Ref Range   Sodium 126 (L) 135 - 145 mmol/L   Potassium 4.0 3.5 - 5.1 mmol/L   Chloride 91 (L) 101 - 111 mmol/L   CO2 20 (L) 22 - 32 mmol/L   Glucose, Bld 144 (H) 65 - 99 mg/dL   BUN 8 6 - 20 mg/dL   Creatinine, Ser 0.81 0.61 - 1.24 mg/dL   Calcium 8.6 (L) 8.9 - 10.3 mg/dL   Total Protein 7.7 6.5 - 8.1 g/dL   Albumin 4.1 3.5 - 5.0 g/dL   AST 221 (H) 15 - 41 U/L   ALT 214 (H) 17 - 63 U/L   Alkaline Phosphatase 77 38 - 126 U/L   Total Bilirubin  1.0 0.3 - 1.2 mg/dL   GFR calc non Af Amer >60 >60 mL/min   GFR calc Af Amer >60 >60 mL/min    Comment: (NOTE) The eGFR has been calculated using the CKD EPI equation. This calculation has not been validated in all clinical situations. eGFR's persistently <60 mL/min signify possible Chronic Kidney Disease.    Anion gap 15 5 - 15    Comment: Performed at [facility], [place] [address] [place], [place] [address]  cbc     Status: None   Collection Time: 03/02/18  2:06 PM  Result Value Ref Range   WBC 7.7 4.0 - 10.5 K/uL   RBC 5.13 4.22 - 5.81 MIL/uL   Hemoglobin 16.8 13.0 - 17.0 g/dL   HCT 46.9 39.0 - 52.0 %   MCV 91.4 78.0 - 100.0 fL   MCH 32.7 26.0 - 34.0 pg   MCHC 35.8 30.0 - 36.0 g/dL   RDW 13.3 11.5 - 15.5 %   Platelets 195 150 - 400 K/uL    Comment: Performed at [facility], [place] [address] [place], [place] [address]   Ethanol     Status: Abnormal   Collection Time: 03/02/18  3:25 PM  Result Value Ref Range   Alcohol, Ethyl (B) 127 (H) <10 mg/dL    Comment: (NOTE) Lowest detectable limit for serum alcohol is 10 mg/dL. For medical purposes only. Performed at [facility], [place] [address], [place], [place] [address]   Lipase, blood     Status: None   Collection Time: 03/02/18  3:25 PM  Result Value Ref Range   Lipase 32 11 - 51 U/L    Comment: Performed at [facility], [place] [address] [place], [place] [address]  Rapid urine drug screen (hospital performed)     Status: Abnormal   Collection Time: 03/02/18  3:26 PM  Result Value Ref Range   Opiates NONE DETECTED NONE DETECTED   Cocaine NONE DETECTED NONE DETECTED   Benzodiazepines POSITIVE (A) NONE DETECTED   Amphetamines NONE DETECTED NONE DETECTED   Tetrahydrocannabinol NONE DETECTED NONE DETECTED   Barbiturates NONE DETECTED NONE DETECTED    Comment: (NOTE) DRUG SCREEN FOR MEDICAL PURPOSES ONLY.  IF CONFIRMATION IS NEEDED FOR ANY PURPOSE, NOTIFY LAB WITHIN 5 DAYS. LOWEST DETECTABLE LIMITS FOR URINE DRUG SCREEN Drug Class                     Cutoff (ng/mL) Amphetamine and metabolites    1000 Barbiturate and metabolites    200 Benzodiazepine                 578 Tricyclics and metabolites     300 Opiates and metabolites        300 Cocaine and metabolites        300 THC                            50 Performed at [facility], [place] [address], [place], [place] [address]   I-Stat Troponin, ED (not at [facility])     Status: None   Collection Time: 03/02/18  5:08 PM  Result Value Ref Range   Troponin i, poc 0.00 0.00 - 0.08 ng/mL   Comment 3            Comment: Due to the release kinetics of cTnI, a negative result within the first hours of the  onset of symptoms does not rule out myocardial infarction with certainty. If myocardial infarction is still suspected, repeat the test at  appropriate intervals.   CBC     Status: None   Collection Time: 03/02/18  5:18 PM  Result Value Ref Range   WBC 6.8 4.0 - 10.5 K/uL   RBC 4.94 4.22 - 5.81 MIL/uL   Hemoglobin 16.1 13.0 - 17.0 g/dL   HCT 45.6 39.0 - 52.0 %   MCV 92.3 78.0 - 100.0 fL   MCH 32.6 26.0 - 34.0 pg   MCHC 35.3 30.0 - 36.0 g/dL   RDW 13.4 11.5 - 15.5 %   Platelets 173 150 - 400 K/uL    Comment: Performed at [facility], [place] [address], [place], [place] [address]  Protime-INR     Status: None   Collection Time: 03/02/18  5:18 PM  Result Value Ref Range   Prothrombin Time 12.2 11.4 - 15.2 seconds   INR 0.91     Comment: Performed at [facility], [place] [address] [place], [place] [address]   No results found.  Pending Labs Unresulted Labs (From admission, onward)   Start     Ordered   03/09/18 0500  Creatinine, serum  (enoxaparin (LOVENOX)    CrCl >/= 30 ml/min)  Weekly,   R    Comments:  while on enoxaparin therapy    03/02/18 1719   03/03/18 0500  Comprehensive metabolic panel  Tomorrow morning,   R     03/02/18 1719   03/03/18 0500  CBC  Tomorrow morning,   R     03/02/18 1719   03/02/18 1718  HIV antibody (Routine Testing)  Once,   R     03/02/18 1719   03/02/18 1718  Creatinine, serum  (enoxaparin (LOVENOX)    CrCl >/= 30 ml/min)  Once,   R    Comments:  Baseline for enoxaparin therapy IF NOT ALREADY DRAWN.    03/02/18 1719   03/02/18 1713  Hepatitis panel, acute  Once,   R     03/02/18 1712   03/02/18 1713  Lipid panel  Once,   R     03/02/18 1712   03/02/18 1713  Hemoglobin A1c  Once,   R     03/02/18 1712      Vitals/Pain Today's Vitals   03/02/18 1346 03/02/18 1403 03/02/18 1606 03/02/18 1738  BP:  (!) 144/77 (!) 182/91 (!) 155/90  Pulse:  (!) 122 (!) 105 (!) 102  Resp:  18 (!) 22 19  Temp:  98.6 F (37 C) 97.9 F (36.6 C)   TempSrc:  Oral Oral   SpO2:  99% 96% 95%  Weight:  160 lb (72.6 kg)    Height:  '5\' 6"'$  (1.676 m)    PainSc: 0-No  pain   0-No pain    Isolation Precautions No active isolations  Medications Medications  LORazepam (ATIVAN) injection 0-4 mg (1 mg Intravenous Given 03/02/18 1517)    Or  LORazepam (ATIVAN) tablet 0-4 mg ( Oral See Alternative 03/02/18 1517)  LORazepam (ATIVAN) injection 0-4 mg (has no administration in time range)    Or  LORazepam (ATIVAN) tablet 0-4 mg (has no administration in time range)  thiamine (VITAMIN B-1) tablet 100 mg (100 mg Oral Given 03/02/18 1524)    Or  thiamine (B-1) injection 100 mg ( Intravenous See Alternative 03/02/18 1524)  nicotine (NICODERM CQ - dosed in mg/24 hours) patch 21 mg (21  mg Transdermal Patch Applied 03/02/18 1523)  0.9 %  sodium chloride infusion ( Intravenous New Bag/Given 03/02/18 1721)  thiamine '500mg'$  in normal saline (30m) IVPB (has no administration in time range)  irbesartan (AVAPRO) tablet 150 mg (has no administration in time range)  imipramine (TOFRANIL) tablet 50 mg (has no administration in time range)  enoxaparin (LOVENOX) injection 40 mg (has no administration in time range)  ipratropium-albuterol (DUONEB) 0.5-2.5 (3) MG/3ML nebulizer solution 3 mL (has no administration in time range)  labetalol (NORMODYNE,TRANDATE) injection 10 mg (has no administration in time range)  sodium chloride 0.9 % bolus 1,000 mL (0 mLs Intravenous Stopped 03/02/18 1546)  LORazepam (ATIVAN) injection 0.5 mg (0.5 mg Intravenous Given 03/02/18 1517)  ondansetron (ZOFRAN) injection 4 mg (4 mg Intravenous Given 03/02/18 1517)  sodium chloride 0.9 % bolus 1,000 mL (0 mLs Intravenous Stopped 55/05/1813358  folic acid (FOLVITE) tablet 1 mg (1 mg Oral Given 03/02/18 1518)  multivitamin with minerals tablet 1 tablet (1 tablet Oral Given 03/02/18 1518)    Mobility walks

## 2018-03-02 NOTE — ED Notes (Signed)
Patient transported to X-ray 

## 2018-03-02 NOTE — ED Notes (Signed)
EDPA Provider at bedside. 

## 2018-03-02 NOTE — ED Notes (Signed)
Visitor at bedside.

## 2018-03-02 NOTE — ED Triage Notes (Addendum)
Pt reports ETOH problem. Drinks 12 pack/daily. Pt has not eaten in 5 days. Pt has not slept in several days. Pt smokes cigarettes to relieve stress. Pt reports recent increased volume in cigarette use. Pt presents anxious. Pt concerned with having "panic attack". Denies other symptoms at present. Here for detox from alcohol. Denies SI/HI. Pt states recent diarrhea. Reports possible dark stool this past week.

## 2018-03-02 NOTE — ED Notes (Signed)
Provider at bedside

## 2018-03-02 NOTE — ED Notes (Signed)
ULTRASOUND AT BEDSIDE

## 2018-03-02 NOTE — ED Notes (Signed)
PT AWARE OF NEED FOR URINE. UNABLE TO GO AT THIS TIME.  PT STATES LAST DRINK ( 2 BEERS) WAS AT 0930 THIS AM.

## 2018-03-02 NOTE — ED Notes (Signed)
ADMITTING Provider at bedside. 

## 2018-03-02 NOTE — ED Provider Notes (Signed)
Patient reports she's been drinking alcohol at least one 12 pack of beer per day. He wishes to stop drinking. He complains of lightheadedness. He reports he hasn't eaten for 4 or 5 days. Patient is alertGlasgow Coma Score 15. Appears tremulous.   Oscar Dakin, MD 03/02/18 [phone]

## 2018-03-02 NOTE — Progress Notes (Signed)
The sister and brother of the patient have the Fax for [employer]. The Fax number is:  [phone] Attention! Admission- need case manager name at [employer].

## 2018-03-02 NOTE — H&P (Addendum)
History and Physical    Oscar Phillips MGQ:[id number] DOB: 06/29/1956 DOA: 03/02/2018  PCP: Janith Lima, MD   Patient coming from: Home    Chief Complaint: Alcohol intoxication, anxiety  HPI: Oscar Phillips is a 62 y.o. male with medical history significant for chronic alcoholism, nicotine abuse, COPD, anxiety who presented to the emergency department with complaints of worsening anxiety and panic attacks.  Patient reports of chronic alcohol abuse for the last 40 years.  He drinks 12 packs of 12 ounces of beer every day , last drink being this morning.  He reports that he also drinks wine sometimes. He says he cannot help himself to stop drinking.  He has a history of anxiety and panic attacks and follows with psychiatry.He felt that he is very anxious and unable to control himself at home today.  Patient also smokes 1/2 packs a day and has been smoking for last 47 years.  He admits that he has history of hyperlipidemia but he cannot take statin due to his liver issues.  He denies any history of cirrhosis.  Patient reported that he has not eaten anything for last 5 days and has not slept. Patient was found to be very anxious, tachycardic and hypertensive. [facility] was called for admission for possible acute alcohol withdrawal. Patient seen and examined the bedside in the emergency department.  He was found to be hypertensive and tachycardic.  He was given Ativan earlier.  During my evaluation he was calm and cooperative and was interested on alcohol rehabilitation.  He said he has been trying to quit alcohol and smoking but has not been able to do so.  He denies any chest pain, shortness of breath, palpitations, abdominal pain, dysuria, nausea, vomiting, diarrhea, headache or fever.   ED Course: Patient CIWA score was found to be around 11 and he was given Ativan.  Noted to be hypertensive.  Review of Systems: As per HPI otherwise 10 point review of systems negative.    Past  Medical History:  Diagnosis Date  . Alcoholism ([place])   . Anxiety   . Broken arm    left arm/has metal splint/06/16/15  . COPD (chronic obstructive pulmonary disease) ([place])   . History of panic attacks   . History of rectal bleeding    in August 2016  . HLD (hyperlipidemia)   . HTN (hypertension)   . Myocardial infarction ([place])    Pt unsure/no symptoms in 05/2015  . OCD (obsessive compulsive disorder)     Past Surgical History:  Procedure Laterality Date  . ELBOW FRACTURE SURGERY Left   . LUMBAR DISC SURGERY    . TONSILLECTOMY  1965     reports that he has been smoking cigarettes.  He has a 25.00 pack-year smoking history. He has never used smokeless tobacco. He reports that he drinks about 9.0 oz of alcohol per week. He reports that he does not use drugs.  Allergies  Allergen Reactions  . Penicillins     Has patient had a PCN reaction causing immediate rash, facial/tongue/throat swelling, SOB or lightheadedness with hypotension: Yes Has patient had a PCN reaction causing severe rash involving mucus membranes or skin necrosis: No Has patient had a PCN reaction that required hospitalization: Unknown Has patient had a PCN reaction occurring within the last 10 years: Unknown If all of the above answers are "NO", then may proceed with Cephalosporin use.     Family History  Problem Relation Age of Onset  . Hyperlipidemia Father   .  Colon cancer Father   . Stomach cancer Father   . Dementia Mother   . Alcohol abuse Maternal Uncle   . Lung cancer Maternal Uncle   . Heart failure Paternal Grandmother   . Emphysema Paternal Grandmother   . Diabetes Neg Hx   . Drug abuse Neg Hx   . Early death Neg Hx   . Heart disease Neg Hx   . Hypertension Neg Hx   . Kidney disease Neg Hx   . Stroke Neg Hx      Prior to Admission medications   Medication Sig Start Date End Date Taking? Authorizing Provider  ALPRAZolam Duanne Moron) 1 MG tablet Take 1 mg by mouth 2 (two) times daily as  needed for anxiety.  05/03/15  Yes [provider]  imipramine (TOFRANIL) 50 MG tablet Take 50 mg by mouth at bedtime.  05/12/15  Yes [provider]  albuterol (PROVENTIL HFA;VENTOLIN HFA) 108 (90 Base) MCG/ACT inhaler Inhale 2 puffs into the lungs every 6 (six) hours as needed for wheezing or shortness of breath. Patient not taking: Reported on 03/02/2018 04/20/17   Biagio Borg, MD  aspirin EC 81 MG tablet Take 1 tablet (81 mg total) by mouth daily. Patient not taking: Reported on 03/02/2018 05/20/15   Janith Lima, MD  cyanocobalamin 2000 MCG tablet Take 1 tablet (2,000 mcg total) by mouth daily. Patient not taking: Reported on 03/02/2018 05/29/16   Janith Lima, MD  dextromethorphan-guaiFENesin [facility] DM) 30-600 MG 12hr tablet Take 1 tablet by mouth 2 (two) times daily as needed for cough. Patient not taking: Reported on 03/02/2018 09/12/16   Nche, Charlene Brooke, NP  predniSONE (DELTASONE) 10 MG tablet 3 tabs by mouth per day for 3 days,2tabs per day for 3 days,1tab per day for 3 days Patient not taking: Reported on 03/02/2018 04/20/17   Biagio Borg, MD  telmisartan (MICARDIS) 40 MG tablet Take 1 tablet (40 mg total) by mouth daily. Patient not taking: Reported on 03/02/2018 11/23/16   Janith Lima, MD  valsartan (DIOVAN) 160 MG tablet Take 1 tablet (160 mg total) by mouth daily. Patient not taking: Reported on 03/02/2018 04/20/17   Biagio Borg, MD    Physical Exam: Vitals:   03/02/18 1403 03/02/18 1606  BP: (!) 144/77 (!) 182/91  Pulse: (!) 122 (!) 105  Resp: 18 (!) 22  Temp: 98.6 F (37 C) 97.9 F (36.6 C)  TempSrc: Oral Oral  SpO2: 99% 96%  Weight: 72.6 kg (160 lb)   Height: 5\' 6"  (1.676 m)     Constitutional: NAD, calm, comfortable Vitals:   03/02/18 1403 03/02/18 1606  BP: (!) 144/77 (!) 182/91  Pulse: (!) 122 (!) 105  Resp: 18 (!) 22  Temp: 98.6 F (37 C) 97.9 F (36.6 C)  TempSrc: Oral Oral  SpO2: 99% 96%  Weight: 72.6 kg (160 lb)   Height: 5'  6" (1.676 m)    General: Anxious Eyes: PERRL, lids and conjunctivae normal ENMT: Mucous membranes are moist. Posterior pharynx clear of any exudate or lesions.Normal dentition.  Neck: normal, supple, no masses, no thyromegaly Respiratory: clear to auscultation bilaterally, no wheezing, no crackles. Normal respiratory effort. No accessory muscle use.  Cardiovascular: Sinus tachycardia,Regular rate and rhythm, no murmurs / rubs / gallops. No extremity edema. 2+ pedal pulses. No carotid bruits.  Abdomen: no tenderness, no masses palpated. No hepatosplenomegaly. Bowel sounds positive.  Musculoskeletal: no clubbing / cyanosis. No joint deformity upper and lower extremities. Good ROM,  no contractures. Normal muscle tone.  Fine tremors on bilateral hands. Skin: no rashes, lesions, ulcers. No induration Neurologic: CN 2-12 grossly intact. Sensation intact, DTR normal. Strength 5/5 in all 4.  Psychiatric: Normal judgment and insight. Alert and oriented x 3.Anxious Foley Catheter:None  Labs on Admission: I have personally reviewed following labs and imaging studies  CBC: Recent Labs  Lab 03/02/18 1406  WBC 7.7  HGB 16.8  HCT 46.9  MCV 91.4  PLT 024   Basic Metabolic Panel: Recent Labs  Lab 03/02/18 1406  NA 126*  K 4.0  CL 91*  CO2 20*  GLUCOSE 144*  BUN 8  CREATININE 0.81  CALCIUM 8.6*   GFR: Estimated Creatinine Clearance: 85.3 mL/min (by C-G formula based on SCr of 0.81 mg/dL). Liver Function Tests: Recent Labs  Lab 03/02/18 1406  AST 221*  ALT 214*  ALKPHOS 77  BILITOT 1.0  PROT 7.7  ALBUMIN 4.1   Recent Labs  Lab 03/02/18 1525  LIPASE 32   No results for input(s): AMMONIA in the last 168 hours. Coagulation Profile: No results for input(s): INR, PROTIME in the last 168 hours. Cardiac Enzymes: No results for input(s): CKTOTAL, CKMB, CKMBINDEX, TROPONINI in the last 168 hours. BNP (last 3 results) No results for input(s): PROBNP in the last 8760  hours. HbA1C: No results for input(s): HGBA1C in the last 72 hours. CBG: No results for input(s): GLUCAP in the last 168 hours. Lipid Profile: No results for input(s): CHOL, HDL, LDLCALC, TRIG, CHOLHDL, LDLDIRECT in the last 72 hours. Thyroid Function Tests: No results for input(s): TSH, T4TOTAL, FREET4, T3FREE, THYROIDAB in the last 72 hours. Anemia Panel: No results for input(s): VITAMINB12, FOLATE, FERRITIN, TIBC, IRON, RETICCTPCT in the last 72 hours. Urine analysis:    Component Value Date/Time   COLORURINE YELLOW 05/24/2016 1036   APPEARANCEUR CLEAR 05/24/2016 1036   LABSPEC <=1.005 (A) 05/24/2016 1036   PHURINE 6.0 05/24/2016 1036   GLUCOSEU NEGATIVE 05/24/2016 1036   HGBUR TRACE-INTACT (A) 05/24/2016 1036   BILIRUBINUR NEGATIVE 05/24/2016 1036   KETONESUR TRACE (A) 05/24/2016 1036   UROBILINOGEN 0.2 05/24/2016 1036   NITRITE NEGATIVE 05/24/2016 1036   LEUKOCYTESUR NEGATIVE 05/24/2016 1036    Radiological Exams on Admission: No results found.   Assessment/Plan Principal Problem:   Chronic alcohol abuse Active Problems:   Anxiety state   TOBACCO USE   Essential hypertension   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 exacerbation (HCC)   Hyperlipidemia with target LDL less than 100   Hyponatremia   Elevated liver enzymes   Alcohol abuse with intoxication delirium ([place])  Chronic alcohol abuse/ acute alcohol withdrawal: Has been drinking for the last 40 years.  Patient could be in early withdrawal phase. Continue CIWA protocol.  Counseled for cessation of alcohol.  Will request for social worker for help with alcohol rehabilitation services. Started on thiamine and folic acid.  Will give IV thiamine once.  History of anxiety/panic attacks: Continue Ativan as needed for now.  Continue imipramine.  He follows with his psychiatrist as an outpatient.  Nicotine abuse: Smokes 1 and 1/2 packs a day for last 47 years.  Patient says he wants to quit but has been unable to do  so.  Counseled for smoking cessation.  History of COPD: Currently his lungs are clear.  Will continue bronchodilators as needed.  Hypertension: Denies any history of hypertension but he was taking medications for high blood pressure in the past and he was found to be hypertensive on presentation.  We will continue ARB.  Blood pressure is elevated most likely secondary to early alcohol withdrawal.  Also tachycardic.  We will continue PRN labetalol.  Hyponatremia: Most likely secondary to beer potamania.  We will continue gentle IV fluids.  Elevated liver enzymes: Most likely secondary to chronic alcohol abuse.  Denies history of cirrhosis.  Will check ultrasound of the liver.  Will check hepatitis panel.  We will continue to monitor liver enzymes.  History of hyperlipidemia: Cannot take statin because of elevated liver enzymes.  Will check lipid panel.   Severity of Illness: The appropriate patient status for this patient is OBSERVATION.     DVT prophylaxis: Lovenox Code Status: Full Family Communication: Present at the bedside Consults called: None     Shelly Coss MD [facility] Pager [phone]  If 7PM-7AM, please contact night-coverage [email or web address] Password [email or web address]  03/02/2018, 5:22 PM

## 2018-03-02 NOTE — ED Notes (Signed)
ED Provider at bedside. EDP J 

## 2018-03-02 NOTE — ED Notes (Signed)
Notified by Elijio Miles to follow up with Dr. Tawanna Solo about transfer to [facility] order. Dr. Tawanna Solo paged awaiting response

## 2018-03-02 NOTE — ED Provider Notes (Addendum)
[facility] DEPT Provider Note   CSN: [id number] Arrival date & time: 03/02/18  1315     History   Chief Complaint Chief Complaint  Patient presents with  . Alcohol Intoxication  . Rectal Bleeding  . Panic Attack    HPI Oscar Phillips is a 62 y.o. male.  HPI Oscar Phillips is a 62 y.o. male with history of of alcoholism for over 40 years, COPD, anxiety, presents to emergency department complaining of worsening anxiety, panic attacks, alcohol abuse, depression.  Patient states that he feels like he lost control of his life.  He states he is unable to stop drinking.  He has been drinking for over 40 years has never been detoxed.  He states he drinks a large amount of alcohol, mainly beer and wine daily.  He states he feels like his anxiety is getting worse, he has been cutting down on his Xanax, because he realized he probably should not be drinking and taking Xanax at the same time.  He states that for the last 5 days he has not slept, he has not eaten anything, he has been tearful, she states he feels like he needs some help.  His last drink was this morning.  He denies any abdominal pain.  No chest pain.  He does have COPD, he is a heavy smoker for last 40 years, and states his friend brought him an inhaler yesterday which has helped.  He denies any history of seizure.  He states he is also noticed that his feet were purple in color yesterday, his friend looked at him and told him he needed to go to the hospital.  He denies any pain in his feet.  Brother at bedside states "I would like him evaluated for mental problems as well as have his medications adjusted."  Patient denies suicidal homicidal thoughts.  Past Medical History:  Diagnosis Date  . Alcoholism ([place])   . Anxiety   . Broken arm    left arm/has metal splint/06/16/15  . COPD (chronic obstructive pulmonary disease) ([place])   . History of panic attacks   . History of rectal bleeding    in  August 2016  . HLD (hyperlipidemia)   . HTN (hypertension)   . Myocardial infarction ([place])    Pt unsure/no symptoms in 05/2015  . OCD (obsessive compulsive disorder)     Patient Active Problem List   Diagnosis Date Noted  . OCD (obsessive compulsive disorder) 04/22/2017  . COPD exacerbation ([place]) 04/20/2017  . Colon cancer screening 05/24/2016  . Abdominal bruit 05/24/2016  . Trichomonal urethritis in male 05/24/2016  . PSA elevation 08/23/2015  . Cough 05/20/2015  . Hyperglycemia 05/20/2015  . Routine general medical examination at a health care facility 05/20/2015  . Essential hypertension 05/20/2015  . 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 exacerbation ([place]) 05/20/2015  . Nonspecific abnormal electrocardiogram (ECG) (EKG) 05/20/2015  . Hyperlipidemia with target LDL less than 100 05/20/2015  . B12 deficiency 09/21/2010  . Irritable bowel syndrome 09/20/2010  . Anxiety state 06/03/2010  . TOBACCO USE 06/03/2010  . DEPRESSION 06/03/2010  . GERD 06/03/2010    Past Surgical History:  Procedure Laterality Date  . ELBOW FRACTURE SURGERY Left   . LUMBAR DISC SURGERY    . TONSILLECTOMY  1965        Home Medications    Prior to Admission medications   Medication Sig Start Date End Date Taking? Authorizing Provider  albuterol (PROVENTIL HFA;VENTOLIN HFA) 108 (90 Base) MCG/ACT inhaler  Inhale 2 puffs into the lungs every 6 (six) hours as needed for wheezing or shortness of breath. 04/20/17   Biagio Borg, MD  ALPRAZolam Duanne Moron) 1 MG tablet 4 (four) times daily.  05/03/15   [provider]  aspirin EC 81 MG tablet Take 1 tablet (81 mg total) by mouth daily. 05/20/15   Janith Lima, MD  cyanocobalamin 2000 MCG tablet Take 1 tablet (2,000 mcg total) by mouth daily. 05/29/16   Janith Lima, MD  dextromethorphan-guaiFENesin [facility] DM) 30-600 MG 12hr tablet Take 1 tablet by mouth 2 (two) times daily as needed for cough. 09/12/16   Nche, Charlene Brooke, NP  imipramine  (TOFRANIL) 50 MG tablet  05/12/15   [provider]  predniSONE (DELTASONE) 10 MG tablet 3 tabs by mouth per day for 3 days,2tabs per day for 3 days,1tab per day for 3 days 04/20/17   Biagio Borg, MD  telmisartan (MICARDIS) 40 MG tablet Take 1 tablet (40 mg total) by mouth daily. 11/23/16   Janith Lima, MD  valsartan (DIOVAN) 160 MG tablet Take 1 tablet (160 mg total) by mouth daily. 04/20/17   Biagio Borg, MD    Family History Family History  Problem Relation Age of Onset  . Hyperlipidemia Father   . Colon cancer Father   . Stomach cancer Father   . Dementia Mother   . Alcohol abuse Maternal Uncle   . Lung cancer Maternal Uncle   . Heart failure Paternal Grandmother   . Emphysema Paternal Grandmother   . Diabetes Neg Hx   . Drug abuse Neg Hx   . Early death Neg Hx   . Heart disease Neg Hx   . Hypertension Neg Hx   . Kidney disease Neg Hx   . Stroke Neg Hx     Social History Social History   Tobacco Use  . Smoking status: Current Every Day Smoker    Packs/day: 1.00    Years: 25.00    Pack years: 25.00    Types: Cigarettes  . Smokeless tobacco: Never Used  Substance Use Topics  . Alcohol use: Yes    Alcohol/week: 9.0 oz    Types: 15 Glasses of wine per week  . Drug use: No     Allergies   Penicillins   Review of Systems Review of Systems  Constitutional: Positive for fatigue. Negative for chills and fever.  Respiratory: Positive for cough and shortness of breath. Negative for chest tightness.   Cardiovascular: Negative for chest pain, palpitations and leg swelling.  Gastrointestinal: Negative for abdominal distention, abdominal pain, diarrhea, nausea and vomiting.  Genitourinary: Negative for dysuria, frequency, hematuria and urgency.  Musculoskeletal: Negative for arthralgias, myalgias, neck pain and neck stiffness.  Skin: Negative for rash.  Allergic/Immunologic: Negative for immunocompromised state.  Neurological: Positive for weakness and  headaches. Negative for dizziness, light-headedness and numbness.  Psychiatric/Behavioral: Positive for confusion, decreased concentration, dysphoric mood and sleep disturbance. Negative for self-injury and suicidal ideas. The patient is nervous/anxious.   All other systems reviewed and are negative.    Physical Exam Updated Vital Signs BP (!) 144/77 (BP Location: Right Arm)   Pulse (!) 122   Temp 98.6 F (37 C) (Oral)   Resp 18   Ht 5\' 6"  (1.676 m)   Wt 72.6 kg (160 lb)   SpO2 99%   BMI 25.82 kg/m   Physical Exam  Constitutional: He is oriented to person, place, and time. He appears well-developed and well-nourished. No  distress.  HENT:  Head: Normocephalic and atraumatic.  Eyes: Pupils are equal, round, and reactive to light. Conjunctivae and EOM are normal.  Neck: Neck supple.  Cardiovascular: Normal rate, regular rhythm, normal heart sounds and intact distal pulses.  Pulmonary/Chest: Effort normal. No respiratory distress. He has no wheezes. He has no rales.  Abdominal: Soft. Bowel sounds are normal. He exhibits no distension. There is no tenderness. There is no rebound.  Musculoskeletal: He exhibits no edema.  Feet are normal other than fungal infection to the toenails.  Capillary refill to all toes is less than 2 seconds.  Dorsalis pedis pulses are normal, equal bilaterally.  No peripheral edema.  Neurological: He is alert and oriented to person, place, and time.  Intentional tremor present  Skin: Skin is warm and dry.  Psychiatric:  tearful  Nursing note and vitals reviewed.    ED Treatments / Results  Labs (all labs ordered are listed, but only abnormal results are displayed) Labs Reviewed  COMPREHENSIVE METABOLIC PANEL - Abnormal; Notable for the following components:      Result Value   Sodium 126 (*)    Chloride 91 (*)    CO2 20 (*)    Glucose, Bld 144 (*)    Calcium 8.6 (*)    AST 221 (*)    ALT 214 (*)    All other components within normal limits    ETHANOL - Abnormal; Notable for the following components:   Alcohol, Ethyl (B) 127 (*)    All other components within normal limits  RAPID URINE DRUG SCREEN, HOSP PERFORMED - Abnormal; Notable for the following components:   Benzodiazepines POSITIVE (*)    All other components within normal limits  CBC  LIPASE, BLOOD  I-STAT TROPONIN, ED    EKG EKG Interpretation  Date/Time:  Saturday Mar 02 2018 16:42:51 EDT Ventricular Rate:  105 PR Interval:  166 QRS Duration: 136 QT Interval:  382 QTC Calculation: 504 R Axis:   -128 Text Interpretation:  Sinus tachycardia Right bundle branch block Abnormal ECG Right bundle branch block New since previous tracing Confirmed by Orlie Dakin [phone]) on 03/02/2018 4:48:20 PM   Radiology No results found.  Procedures Procedures (including critical care time)  CRITICAL CARE Performed by: Briell Paulette Total critical care time: 30 minutes Critical care time was exclusive of separately billable procedures and treating other patients. Critical care was necessary to treat or prevent imminent or life-threatening deterioration. Critical care was time spent personally by me on the following activities: development of treatment plan with patient and/or surrogate as well as nursing, discussions with consultants, evaluation of patient's response to treatment, examination of patient, obtaining history from patient or surrogate, ordering and performing treatments and interventions, ordering and review of laboratory studies, ordering and review of radiographic studies, pulse oximetry and re-evaluation of patient's condition.  Medications Ordered in ED Medications  sodium chloride 0.9 % bolus 1,000 mL (has no administration in time range)  LORazepam (ATIVAN) injection 0.5 mg (has no administration in time range)  LORazepam (ATIVAN) injection 0-4 mg (has no administration in time range)    Or  LORazepam (ATIVAN) tablet 0-4 mg (has no administration  in time range)  LORazepam (ATIVAN) injection 0-4 mg (has no administration in time range)    Or  LORazepam (ATIVAN) tablet 0-4 mg (has no administration in time range)  thiamine (VITAMIN B-1) tablet 100 mg (has no administration in time range)    Or  thiamine (B-1) injection 100 mg (has no  administration in time range)  nicotine (NICODERM CQ - dosed in mg/24 hours) patch 21 mg (has no administration in time range)     Initial Impression / Assessment and Plan / ED Course  I have reviewed the triage vital signs and the nursing notes.  Pertinent labs & imaging results that were available during my care of the patient were reviewed by me and considered in my medical decision making (see chart for details).     Patient emergency department requesting help with his depression, anxiety, alcohol problem, apparently has not been sleeping or eating for 5 days, binging or alcohol.  Patient appears to be very anxious, tremulous, possibly starting to withdraw.  Heart rate is 122.  Blood pressure 144/77.  I will give him IV fluids, thiamine, folic acid, Ativan, will check labs.   Pt hyponatremic. Still tachycardic, now hypertensive. Early DTs? Seems someone confuse at times, but AAOx3 when asked. Will admit for observation and medical detox. Fluids running.   Spoke with medicine, will admit.   Vitals:   03/02/18 1403 03/02/18 1606  BP: (!) 144/77 (!) 182/91  Pulse: (!) 122 (!) 105  Resp: 18 (!) 22  Temp: 98.6 F (37 C) 97.9 F (36.6 C)  TempSrc: Oral Oral  SpO2: 99% 96%  Weight: 72.6 kg (160 lb)   Height: 5\' 6"  (1.676 m)      Final Clinical Impressions(s) / ED Diagnoses   Final diagnoses:  Alcohol abuse  Hyponatremia  Tachycardia  Secondary hypertension    ED Discharge Orders    None       Jeannett Senior, PA-C 03/02/18 1703    Orlie Dakin, MD 03/03/18 0700    Jeannett Senior, PA-C 03/26/18 [place], MD 04/01/18 [phone]

## 2018-03-03 DIAGNOSIS — F1010 Alcohol abuse, uncomplicated: Secondary | ICD-10-CM | POA: Diagnosis not present

## 2018-03-03 LAB — COMPREHENSIVE METABOLIC PANEL
ALBUMIN: 3.6 g/dL (ref 3.5–5.0)
ALK PHOS: 67 U/L (ref 38–126)
ALT: 156 U/L — AB (ref 17–63)
AST: 136 U/L — AB (ref 15–41)
Anion gap: 13 (ref 5–15)
BUN: 7 mg/dL (ref 6–20)
CALCIUM: 8.5 mg/dL — AB (ref 8.9–10.3)
CO2: 24 mmol/L (ref 22–32)
CREATININE: 0.74 mg/dL (ref 0.61–1.24)
Chloride: 95 mmol/L — ABNORMAL LOW (ref 101–111)
GFR calc Af Amer: >60 mL/min (ref >60)
GFR calc non Af Amer: >60 mL/min (ref >60)
Glucose, Bld: 94 mg/dL (ref 65–99)
Potassium: 3.4 mmol/L — ABNORMAL LOW (ref 3.5–5.1)
SODIUM: 132 mmol/L — AB (ref 135–145)
Total Bilirubin: 1.8 mg/dL — ABNORMAL HIGH (ref 0.3–1.2)
Total Protein: 6.8 g/dL (ref 6.5–8.1)

## 2018-03-03 LAB — CBC
HCT: 44.7 % (ref 39.0–52.0)
HEMOGLOBIN: 15.7 g/dL (ref 13.0–17.0)
MCH: 32.5 pg (ref 26.0–34.0)
MCHC: 35.1 g/dL (ref 30.0–36.0)
MCV: 92.5 fL (ref 78.0–100.0)
PLATELETS: 157 10*3/uL (ref 150–400)
RBC: 4.83 MIL/uL (ref 4.22–5.81)
RDW: 13.4 % (ref 11.5–15.5)
WBC: 7.4 10*3/uL (ref 4.0–10.5)

## 2018-03-03 LAB — HEMOGLOBIN A1C
Hgb A1c MFr Bld: 5.5 % (ref 4.8–5.6)
Mean Plasma Glucose: 111.15 mg/dL

## 2018-03-03 LAB — HIV ANTIBODY (ROUTINE TESTING W REFLEX): HIV Screen 4th Generation wRfx: NONREACTIVE

## 2018-03-03 MED ORDER — POTASSIUM CHLORIDE CRYS ER 20 MEQ PO TBCR
40.0000 meq | EXTENDED_RELEASE_TABLET | Freq: Once | ORAL | Status: AC
  Administered 2018-03-03: 40 meq via ORAL
  Filled 2018-03-03: qty 2

## 2018-03-03 MED ORDER — LORAZEPAM 2 MG/ML IJ SOLN
2.0000 mg | Freq: Once | INTRAMUSCULAR | Status: AC
  Administered 2018-03-03: 2 mg via INTRAVENOUS
  Filled 2018-03-03: qty 1

## 2018-03-03 MED ORDER — NICOTINE 21 MG/24HR TD PT24: 21.0000 mg | MEDICATED_PATCH | Freq: Every day | TRANSDERMAL | 0 refills | Status: DC

## 2018-03-03 MED ORDER — ALBUTEROL SULFATE HFA 108 (90 BASE) MCG/ACT IN AERS: 2.0000 | INHALATION_SPRAY | Freq: Four times a day (QID) | RESPIRATORY_TRACT | 2 refills | Status: DC | PRN

## 2018-03-03 MED ORDER — VALSARTAN 160 MG PO TABS: 160.0000 mg | ORAL_TABLET | Freq: Every day | ORAL | 0 refills | Status: DC

## 2018-03-03 MED ORDER — THIAMINE HCL 100 MG PO TABS: 100.0000 mg | ORAL_TABLET | Freq: Every day | ORAL | 0 refills | Status: DC

## 2018-03-03 MED ORDER — FOLIC ACID 1 MG PO TABS: 1.0000 mg | ORAL_TABLET | Freq: Every day | ORAL | 0 refills | Status: DC

## 2018-03-03 MED ORDER — AMLODIPINE BESYLATE 10 MG PO TABS: 10.0000 mg | ORAL_TABLET | Freq: Every day | ORAL | 0 refills | Status: DC

## 2018-03-03 MED ORDER — AMLODIPINE BESYLATE 10 MG PO TABS
10.0000 mg | ORAL_TABLET | Freq: Every day | ORAL | Status: DC
  Administered 2018-03-03 – 2018-03-04 (×2): 10 mg via ORAL
  Filled 2018-03-03 (×2): qty 1

## 2018-03-03 MED ORDER — PNEUMOCOCCAL VAC POLYVALENT 25 MCG/0.5ML IJ INJ
0.5000 mL | INJECTION | INTRAMUSCULAR | Status: AC
  Administered 2018-03-04: 0.5 mL via INTRAMUSCULAR
  Filled 2018-03-03: qty 0.5

## 2018-03-03 NOTE — Social Work (Addendum)
CSW received call from floor RN about resources for patient. CSW contacted [facility] to discuss admission process. CSW left message. Pt would not be able to remain in hospital until he has placement as that process can take time and when patient is medically ready would dc. CSW will f/u as family at bedside.  11:00 am: CSW met with with patient, sister, brother at bedside. They confirmed that they have already begun the admission process for [facility] and he would admitt on Wednesday. CSW validated. Family wanted patient to go to detox program or have supervision until he goes to residential treatment program.  CSW indicated that pt would not be abel to remain in hospital until he gets into program.  CSW will f/u with [facility] to see if he wld be a good candidate for that until Wednesday. CSW encouraged family to provide support of patient at home until Wednesday if they can. Family agreeable and still would desire an interim placement option.   CSW called [facility] to discuss placement until Wednesday as family interested, however CSW unable to reach staff. CSW left message on voicemail of sister Stanton Kidney advising of same. Pt would need to dc when medically ready with family to provide support until he admits to [facility] on Wednesday.   Elissa Hefty, LCSW Clinical Social Worker [phone]

## 2018-03-03 NOTE — Progress Notes (Signed)
PROGRESS NOTE    Oscar Phillips  ZDG:[id number] DOB: 06/08/1956 DOA: 03/02/2018 PCP: Janith Lima, MD   Brief Narrative: Oscar Phillips a 62 y.o.malewith medical history significantfor chronic alcoholism, nicotine abuse, COPD, anxiety who presented to the emergency department with complaints of worsening anxiety and panic attacks.Patient reports of chronic alcohol abuse for the last 40 years.He drinks 12 packs of 12 ounces of beer every day ,last drink being this morning. He reports that he also drinks wine sometimes. He says he cannot help himself to stop drinking. He has a history of anxiety and panic attacks and follows with psychiatry.He felt thathe is very anxious and unable to control himself at home today.[place] smokes 1/2 packs a day and has been smoking for last 47 years. He admits that he has history of hyperlipidemia but he cannot take statin due to his liver issues.He denies any history of cirrhosis.Patient reported that he has not eaten anything for last 5 days and has not slept. Patient was found to be very anxious,tachycardic and hypertensive on presentation.  Patient was started on CIWA protocol.   Assessment & Plan:   Principal Problem:   Chronic alcohol abuse Active Problems:   Anxiety state   TOBACCO USE   Essential hypertension   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 exacerbation (HCC)   Hyperlipidemia with target LDL less than 100   Hyponatremia   Elevated liver enzymes   Alcohol abuse with intoxication delirium ([place])   Chronic alcohol abuse/acute alcohol withdrawal:Has been drinking for the last 40 years. Patient was found to be in early withdrawal phase. Continued with CIWA protocol.Counseled for cessation of alcohol.We requested for social worker for help with alcohol rehabilitation services. Started on thiamine and folic acid.  Patient noted to be tachycardic and hypertensive so discharge canceled.  History of  anxiety/panic attacks: Continue imipramine at home.He follows with his psychiatrist as an outpatient.  Counseled about the risks of taking Xanax and alcohol at the same time.  Nicotine abuse: Smokes1 and1/2 packs a day for last 47 years. Patient says he wants to quit but has been unable to do so.Counseled for smoking cessation.  History of COPD: Currently hislungs are clear. Will continue bronchodilators as needed.  Hypertension: Denies any history of hypertension but he was taking medications for high blood pressure in the past andhewas found to be hypertensive on presentation. We will continue ARB.  Amlodipine added.  Hyponatremia: Most likely secondary to beer potamania.Much improved this morning.  Elevated liver enzymes: Most likely secondary to chronic alcohol abuse. Denies history of cirrhosis.Ultrasound of the liver showed hepatic steatosis. Hepatitis panel pending. Monitor liver enzymes as an outpatient.  History of hyperlipidemia: Cannot take statin because of elevated liver enzymes.  Lipid panel checked here and found to be normal.     DVT prophylaxis: Lovenox Code Status: Full Family Communication: Discussed with brother and sister at the bedside  Disposition Plan: home in 1 to 2 days    Consultants: None  Procedures: None  Antimicrobials: None  Subjective: Patient seen and examined the bedside this morning.  This morning he was comfortable.  Discharge planning was done but he was found to be more anxious, hypertensive and tachycardic later today so discharge canceled.  Objective: Vitals:   03/03/18 0445 03/03/18 0634 03/03/18 0839 03/03/18 1231  BP: (!) 176/89 (!) 172/99 (!) 177/104 (!) 180/85  Pulse: (!) 103 99 (!) 107 (!) 108  Resp: 18 18 16  (!) 22  Temp: 98.4 F (36.9 C) 98.3 F (36.8 C)  98 F (36.7 C) 98.2 F (36.8 C)  TempSrc: Oral Oral Oral Oral  SpO2: 96% 93% 93% 92%  Weight:      Height:        Intake/Output Summary (Last 24  hours) at 03/03/2018 1301 Last data filed at 03/03/2018 1100 Gross per 24 hour  Intake 4403.75 ml  Output 550 ml  Net 3853.75 ml   Filed Weights   03/02/18 1403 03/02/18 1941  Weight: 72.6 kg (160 lb) 66.9 kg (147 lb 7.8 oz)    Examination:  General exam: Appears calm and comfortable ,Not in distress,average built,anxious HEENT:PERRL,Oral mucosa moist, Ear/Nose normal on gross exam Respiratory system: Bilateral equal air entry, normal vesicular breath sounds, no wheezes or crackles  Cardiovascular system: Sinus tachycardia,. No JVD, murmurs, rubs, gallops or clicks. No pedal edema. Gastrointestinal system: Abdomen is nondistended, soft and nontender. No organomegaly or masses felt. Normal bowel sounds heard. Central nervous system: Alert and oriented. No focal neurological deficits. Extremities: No edema, no clubbing ,no cyanosis, distal peripheral pulses palpable. Skin: No rashes, lesions or ulcers,no icterus ,no pallor MSK: Normal muscle bulk,tone ,power Psychiatry: Judgement and insight appear normal. Mood & affect appropriate.     Data Reviewed: I have personally reviewed following labs and imaging studies  CBC: Recent Labs  Lab 03/02/18 1406 03/02/18 1718 03/03/18 0520  WBC 7.7 6.8 7.4  HGB 16.8 16.1 15.7  HCT 46.9 45.6 44.7  MCV 91.4 92.3 92.5  PLT 195 173 601   Basic Metabolic Panel: Recent Labs  Lab 03/02/18 1406 03/02/18 1718 03/03/18 0520  NA 126*  --  132*  K 4.0  --  3.4*  CL 91*  --  95*  CO2 20*  --  24  GLUCOSE 144*  --  94  BUN 8  --  7  CREATININE 0.81 0.98 0.74  CALCIUM 8.6*  --  8.5*   GFR: Estimated Creatinine Clearance: 90.6 mL/min (by C-G formula based on SCr of 0.74 mg/dL). Liver Function Tests: Recent Labs  Lab 03/02/18 1406 03/03/18 0520  AST 221* 136*  ALT 214* 156*  ALKPHOS 77 67  BILITOT 1.0 1.8*  PROT 7.7 6.8  ALBUMIN 4.1 3.6   Recent Labs  Lab 03/02/18 1525  LIPASE 32   No results for input(s): AMMONIA in the last  168 hours. Coagulation Profile: Recent Labs  Lab 03/02/18 1718  INR 0.91   Cardiac Enzymes: No results for input(s): CKTOTAL, CKMB, CKMBINDEX, TROPONINI in the last 168 hours. BNP (last 3 results) No results for input(s): PROBNP in the last 8760 hours. HbA1C: Recent Labs    03/02/18 1713  HGBA1C 5.5   CBG: No results for input(s): GLUCAP in the last 168 hours. Lipid Profile: Recent Labs    03/02/18 1713  CHOL 162  HDL 83  LDLCALC 56  TRIG 115  CHOLHDL 2.0   Thyroid Function Tests: No results for input(s): TSH, T4TOTAL, FREET4, T3FREE, THYROIDAB in the last 72 hours. Anemia Panel: No results for input(s): VITAMINB12, FOLATE, FERRITIN, TIBC, IRON, RETICCTPCT in the last 72 hours. Sepsis Labs: No results for input(s): PROCALCITON, LATICACIDVEN in the last 168 hours.  No results found for this or any previous visit (from the past 240 hour(s)).       Radiology Studies: Dg Chest 2 View  Result Date: 03/02/2018 CLINICAL DATA:  Anxiety and ethanol abuse EXAM: CHEST - 2 VIEW COMPARISON:  April 20, 2017 FINDINGS: Lungs appear somewhat hyperexpanded. There are scattered areas of slight scarring in mid  lung regions. There is no edema or consolidation. The heart size and pulmonary vascularity normal. No adenopathy. There is aortic atherosclerosis. No evident bone lesions. IMPRESSION: Lungs somewhat hyperexpanded with areas of mild scarring. No edema or consolidation. No adenopathy evident. There is aortic atherosclerosis. Aortic Atherosclerosis (ICD10-I70.0). Electronically Signed   By: Lowella Grip III M.D.   On: 03/02/2018 18:12   US Abdomen Limited Ruq  Result Date: 03/02/2018 CLINICAL DATA:  Elevated liver enzymes with chronic ethanol abuse EXAM: ULTRASOUND ABDOMEN LIMITED RIGHT UPPER QUADRANT COMPARISON:  None. FINDINGS: Gallbladder: No gallstones or wall thickening visualized. There is no pericholecystic fluid. No sonographic Murphy sign noted by sonographer. Common bile  duct: Diameter: 4 mm. No intrahepatic or extrahepatic biliary duct dilatation. Liver: No focal lesion identified. Liver echogenicity overall is increased. Portal vein is patent on color Doppler imaging with normal direction of blood flow towards the liver. IMPRESSION: Generalized increase in liver echogenicity, finding indicative of hepatic steatosis. Underlying parenchymal liver disease cannot excluded. While no focal liver lesions are evident on this study, it must be cautioned that the sensitivity of ultrasound for detection of focal liver lesions is diminished in this circumstance. Study otherwise unremarkable. Electronically Signed   By: Lowella Grip III M.D.   On: 03/02/2018 18:54        Scheduled Meds: . amLODipine  10 mg Oral Daily  . enoxaparin (LOVENOX) injection  40 mg Subcutaneous Q24H  . imipramine  50 mg Oral QHS  . irbesartan  150 mg Oral Daily  . LORazepam  0-4 mg Intravenous Q6H   Or  . LORazepam  0-4 mg Oral Q6H  . [START ON 03/04/2018] LORazepam  0-4 mg Intravenous Q12H   Or  . [START ON 03/04/2018] LORazepam  0-4 mg Oral Q12H  . nicotine  21 mg Transdermal Daily  . [START ON 03/04/2018] pneumococcal 23 valent vaccine  0.5 mL Intramuscular Tomorrow-1000  . thiamine  100 mg Oral Daily   Or  . thiamine  100 mg Intravenous Daily   Continuous Infusions: . thiamine injection       LOS: 0 days    Time spent: 25 mins.More than 50% of that time was spent in counseling and/or coordination of care.      Shelly Coss, MD [facility] Pager [phone]  If 7PM-7AM, please contact night-coverage [email or web address] Password [email or web address] 03/03/2018, 1:01 PM

## 2018-03-03 NOTE — Progress Notes (Signed)
Patient's Vitals were: 98.72F;HR 117;RR16;183/97;94% Room Air.  Patient's CIWA score was 12.  PCP was notified.

## 2018-03-03 NOTE — Progress Notes (Signed)
Case management consulted regarding pts request for records to be faxed to [facility] in preparation for his admission there this week.  Thank you.

## 2018-03-03 NOTE — Progress Notes (Signed)
Pt up to bathroom, quite unsteady on his feet.  1 assist.  CIWA 6.  BP slightly elevated.  Dr. Tawanna Solo notified.  Will cancel dc orders for today and monitor patient closely for further withdrawal symptoms.  Pts sister Stanton Kidney notified and supportive of decision.  Pt agreeable to staying.

## 2018-03-03 NOTE — Discharge Summary (Addendum)
Physician Discharge Summary  [name] YHC:[id number] DOB: Sep 26, 1956 DOA: 03/02/2018  PCP: Janith Lima, MD  Admit date: 03/02/2018 Discharge date: 03/04/18 Admitted From: Home Disposition:  Home  Discharge Condition:Stable CODE STATUS:FULL Diet recommendation: Heart Healthy  Brief/Interim Summary:  Oscar Phillips is a 62 y.o. male with medical history significant for chronic alcoholism, nicotine abuse, COPD, anxiety who presented to the emergency department with complaints of worsening anxiety and panic attacks.  Patient reports of chronic alcohol abuse for the last 40 years.  He drinks 12 packs of 12 ounces of beer every day , last drink being this morning.  He reports that he also drinks wine sometimes. He says he cannot help himself to stop drinking.  He has a history of anxiety and panic attacks and follows with psychiatry.He felt that he is very anxious and unable to control himself at home today.  Patient also smokes 1/2 packs a day and has been smoking for last 47 years.  He admits that he has history of hyperlipidemia but he cannot take statin due to his liver issues.  He denies any history of cirrhosis.  Patient reported that he has not eaten anything for last 5 days and has not slept. Patient was found to be very anxious, tachycardic and hypertensive on presentation.  Patient was started on CIWA protocol. This morning he looked comfortable.  He is not anxious and is alert and oriented and coherent.  He really wants to quit alcohol and wants to follow-up at alcohol rehabilitation services on Wednesday.  His blood pressure was noted to be slightly elevated this morning otherwise he was hemodynamically stable.  We discussed with patient along with his family members at the bedside this morning.  He is stable to be discharged home today.  Appropriate prescriptions have been provided.  Strongly counseled for stopping smoking and alcohol and highly recommended to follow-up at ALCOHOL  rehabilitation service.  Following problems were addressed during his hospitalization:  Chronic alcohol abuse/ acute alcohol withdrawal: Has been drinking for the last 40 years.  Patient was found to be in early withdrawal phase. Continued with CIWA protocol.  Counseled for cessation of alcohol.  We requested for social worker for help with alcohol rehabilitation services. Started on thiamine and folic acid.   Patient not withdrawing this morning.  Hemodynamically stable.  History of anxiety/panic attacks:   Continue imipramine at home.  He follows with his psychiatrist as an outpatient.  Counseled about the risks of taking Xanax and alcohol at the same time.  Nicotine abuse: Smokes 1 and 1/2 packs a day for last 47 years.  Patient says he wants to quit but has been unable to do so.  Counseled for smoking cessation.  History of COPD: Currently his lungs are clear.  Will continue bronchodilators as needed.  Hypertension: Denies any history of hypertension but he was taking medications for high blood pressure in the past and he was found to be hypertensive on presentation.  We will continue ARB.   Amlodipine added.   Hyponatremia: Most likely secondary to beer potamania. Much improved this morning. Elevated liver enzymes: Most likely secondary to chronic alcohol abuse. Denies history of cirrhosis.Ultrasound of the liver showed hepatic steatosis. Hepatitis panel pending.  Monitor liver enzymes as an outpatient.  History of hyperlipidemia: Cannot take statin because of elevated liver enzymes.  Lipid panel checked here and found to be normal.    Discharge Diagnoses:  Principal Problem:   Chronic alcohol abuse Active Problems:  Anxiety state   TOBACCO USE   Essential hypertension   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 exacerbation (HCC)   Hyperlipidemia with target LDL less than 100   Hyponatremia   Elevated liver enzymes   Alcohol abuse with intoxication delirium  [facility])    Discharge Instructions  Discharge Instructions    Diet - low sodium heart healthy   Complete by:  As directed    Discharge instructions   Complete by:  As directed    1) Please follow-up with your PCP and psychiatrist as an outpatient.Do CBC ,BMP, liver function tests in a week. 2) Take prescribed medications as instructed. 3)Follow up at alcohol rehabilitation facility as soon as possible.Stop alcohol and smoking.   Increase activity slowly   Complete by:  As directed      Allergies as of 03/03/2018      Reactions   Penicillins    Has patient had a PCN reaction causing immediate rash, facial/tongue/throat swelling, SOB or lightheadedness with hypotension: Yes Has patient had a PCN reaction causing severe rash involving mucus membranes or skin necrosis: No Has patient had a PCN reaction that required hospitalization: Unknown Has patient had a PCN reaction occurring within the last 10 years: Unknown If all of the above answers are "NO", then may proceed with Cephalosporin use.      Medication List    STOP taking these medications   aspirin EC 81 MG tablet   dextromethorphan-guaiFENesin 30-600 MG 12hr tablet Commonly known as:  MUCINEX DM   predniSONE 10 MG tablet Commonly known as:  DELTASONE   telmisartan 40 MG tablet Commonly known as:  MICARDIS     TAKE these medications   albuterol 108 (90 Base) MCG/ACT inhaler Commonly known as:  PROVENTIL HFA;VENTOLIN HFA Inhale 2 puffs into the lungs every 6 (six) hours as needed for wheezing or shortness of breath.   ALPRAZolam 1 MG tablet Commonly known as:  XANAX Take 1 mg by mouth 2 (two) times daily as needed for anxiety.   amLODipine 10 MG tablet Commonly known as:  NORVASC Take 1 tablet (10 mg total) by mouth daily. Start taking on:  03/04/2018   cyanocobalamin 2000 MCG tablet Take 1 tablet (2,000 mcg total) by mouth daily.   folic acid 1 MG tablet Commonly known as:  FOLVITE Take 1 tablet (1 mg total)  by mouth daily.   imipramine 50 MG tablet Commonly known as:  TOFRANIL Take 50 mg by mouth at bedtime.   nicotine 21 mg/24hr patch Commonly known as:  NICODERM CQ - dosed in mg/24 hours Place 1 patch (21 mg total) onto the skin daily. Start taking on:  03/04/2018   thiamine 100 MG tablet Take 1 tablet (100 mg total) by mouth daily. Start taking on:  03/04/2018   valsartan 160 MG tablet Commonly known as:  DIOVAN Take 1 tablet (160 mg total) by mouth daily.      Follow-up Information    Janith Lima, MD. Schedule an appointment as soon as possible for a visit in 1 week(s).   Specialty:  Internal Medicine Contact information: [address] [address] [phone]          Allergies  Allergen Reactions  . Penicillins     Has patient had a PCN reaction causing immediate rash, facial/tongue/throat swelling, SOB or lightheadedness with hypotension: Yes Has patient had a PCN reaction causing severe rash involving mucus membranes or skin necrosis: No Has patient had a PCN reaction  that required hospitalization: Unknown Has patient had a PCN reaction occurring within the last 10 years: Unknown If all of the above answers are "NO", then may proceed with Cephalosporin use.     Consultations:  None   Procedures/Studies: Dg Chest 2 View  Result Date: 03/02/2018 CLINICAL DATA:  Anxiety and ethanol abuse EXAM: CHEST - 2 VIEW COMPARISON:  April 20, 2017 FINDINGS: Lungs appear somewhat hyperexpanded. There are scattered areas of slight scarring in mid lung regions. There is no edema or consolidation. The heart size and pulmonary vascularity normal. No adenopathy. There is aortic atherosclerosis. No evident bone lesions. IMPRESSION: Lungs somewhat hyperexpanded with areas of mild scarring. No edema or consolidation. No adenopathy evident. There is aortic atherosclerosis. Aortic Atherosclerosis (ICD10-I70.0). Electronically Signed   By: Lowella Grip III  M.D.   On: 03/02/2018 18:12   US Abdomen Limited Ruq  Result Date: 03/02/2018 CLINICAL DATA:  Elevated liver enzymes with chronic ethanol abuse EXAM: ULTRASOUND ABDOMEN LIMITED RIGHT UPPER QUADRANT COMPARISON:  None. FINDINGS: Gallbladder: No gallstones or wall thickening visualized. There is no pericholecystic fluid. No sonographic Murphy sign noted by sonographer. Common bile duct: Diameter: 4 mm. No intrahepatic or extrahepatic biliary duct dilatation. Liver: No focal lesion identified. Liver echogenicity overall is increased. Portal vein is patent on color Doppler imaging with normal direction of blood flow towards the liver. IMPRESSION: Generalized increase in liver echogenicity, finding indicative of hepatic steatosis. Underlying parenchymal liver disease cannot excluded. While no focal liver lesions are evident on this study, it must be cautioned that the sensitivity of ultrasound for detection of focal liver lesions is diminished in this circumstance. Study otherwise unremarkable. Electronically Signed   By: Lowella Grip III M.D.   On: 03/02/2018 18:54       Discharge Exam: Vitals:   03/03/18 0634 03/03/18 0839  BP: (!) 172/99 (!) 177/104  Pulse: 99 (!) 107  Resp: 18 16  Temp: 98.3 F (36.8 C) 98 F (36.7 C)  SpO2: 93% 93%   Vitals:   03/02/18 2346 03/03/18 0445 03/03/18 0634 03/03/18 0839  BP: (!) 183/97 (!) 176/89 (!) 172/99 (!) 177/104  Pulse: (!) 117 (!) 103 99 (!) 107  Resp: 16 18 18 16   Temp: 98.2 F (36.8 C) 98.4 F (36.9 C) 98.3 F (36.8 C) 98 F (36.7 C)  TempSrc: Oral Oral Oral Oral  SpO2: 94% 96% 93% 93%  Weight:      Height:        General: Pt is alert, awake, not in acute distress Cardiovascular: RRR, S1/S2 +, no rubs, no gallops Respiratory: CTA bilaterally, no wheezing, no rhonchi Abdominal: Soft, NT, ND, bowel sounds + Extremities: no edema, no cyanosis    The results of significant diagnostics from this hospitalization (including imaging,  microbiology, ancillary and laboratory) are listed below for reference.     Microbiology: No results found for this or any previous visit (from the past 240 hour(s)).   Labs: BNP (last 3 results) No results for input(s): BNP in the last 8760 hours. Basic Metabolic Panel: Recent Labs  Lab 03/02/18 1406 03/02/18 1718 03/03/18 0520  NA 126*  --  132*  K 4.0  --  3.4*  CL 91*  --  95*  CO2 20*  --  24  GLUCOSE 144*  --  94  BUN 8  --  7  CREATININE 0.81 0.98 0.74  CALCIUM 8.6*  --  8.5*   Liver Function Tests: Recent Labs  Lab 03/02/18 1406 03/03/18  0520  AST 221* 136*  ALT 214* 156*  ALKPHOS 77 67  BILITOT 1.0 1.8*  PROT 7.7 6.8  ALBUMIN 4.1 3.6   Recent Labs  Lab 03/02/18 1525  LIPASE 32   No results for input(s): AMMONIA in the last 168 hours. CBC: Recent Labs  Lab 03/02/18 1406 03/02/18 1718 03/03/18 0520  WBC 7.7 6.8 7.4  HGB 16.8 16.1 15.7  HCT 46.9 45.6 44.7  MCV 91.4 92.3 92.5  PLT 195 173 157   Cardiac Enzymes: No results for input(s): CKTOTAL, CKMB, CKMBINDEX, TROPONINI in the last 168 hours. BNP: Invalid input(s): POCBNP CBG: No results for input(s): GLUCAP in the last 168 hours. D-Dimer No results for input(s): DDIMER in the last 72 hours. Hgb A1c Recent Labs    03/02/18 1713  HGBA1C 5.5   Lipid Profile Recent Labs    03/02/18 1713  CHOL 162  HDL 83  LDLCALC 56  TRIG 115  CHOLHDL 2.0   Thyroid function studies No results for input(s): TSH, T4TOTAL, T3FREE, THYROIDAB in the last 72 hours.  Invalid input(s): FREET3 Anemia work up No results for input(s): VITAMINB12, FOLATE, FERRITIN, TIBC, IRON, RETICCTPCT in the last 72 hours. Urinalysis    Component Value Date/Time   COLORURINE YELLOW 05/24/2016 1036   APPEARANCEUR CLEAR 05/24/2016 1036   LABSPEC <=1.005 (A) 05/24/2016 1036   PHURINE 6.0 05/24/2016 1036   GLUCOSEU NEGATIVE 05/24/2016 1036   HGBUR TRACE-INTACT (A) 05/24/2016 1036   BILIRUBINUR NEGATIVE 05/24/2016 1036    KETONESUR TRACE (A) 05/24/2016 1036   UROBILINOGEN 0.2 05/24/2016 1036   NITRITE NEGATIVE 05/24/2016 1036   LEUKOCYTESUR NEGATIVE 05/24/2016 1036   Sepsis Labs Invalid input(s): PROCALCITONIN,  WBC,  LACTICIDVEN Microbiology No results found for this or any previous visit (from the past 240 hour(s)).  Please note: You were cared for by a hospitalist during your hospital stay. Once you are discharged, your primary care physician will handle any further medical issues. Please note that NO REFILLS for any discharge medications will be authorized once you are discharged, as it is imperative that you return to your primary care physician (or establish a relationship with a primary care physician if you do not have one) for your post hospital discharge needs so that they can reassess your need for medications and monitor your lab values.    Time coordinating discharge: 40 minutes  SIGNED:   Shelly Coss, MD  [facility] 03/03/2018, 12:20 PM Pager [phone]  If 7PM-7AM, please contact night-coverage [email or web address] Password [email or web address]

## 2018-03-04 DIAGNOSIS — F1010 Alcohol abuse, uncomplicated: Secondary | ICD-10-CM | POA: Diagnosis not present

## 2018-03-04 LAB — HEPATITIS PANEL, ACUTE
HCV Ab: <0.1 s/co ratio (ref 0.0–0.9)
HEP B C IGM: NEGATIVE
HEP B S AG: NEGATIVE
Hep A IgM: NEGATIVE

## 2018-03-04 LAB — BASIC METABOLIC PANEL
Anion gap: 12 (ref 5–15)
BUN: 9 mg/dL (ref 6–20)
CHLORIDE: 95 mmol/L — AB (ref 101–111)
CO2: 25 mmol/L (ref 22–32)
Calcium: 8.7 mg/dL — ABNORMAL LOW (ref 8.9–10.3)
Creatinine, Ser: 0.79 mg/dL (ref 0.61–1.24)
GFR calc non Af Amer: >60 mL/min (ref >60)
Glucose, Bld: 102 mg/dL — ABNORMAL HIGH (ref 65–99)
POTASSIUM: 3.6 mmol/L (ref 3.5–5.1)
SODIUM: 132 mmol/L — AB (ref 135–145)

## 2018-03-04 LAB — MAGNESIUM: MAGNESIUM: 1.9 mg/dL (ref 1.7–2.4)

## 2018-03-04 MED ORDER — VALSARTAN 160 MG PO TABS: 160.0000 mg | ORAL_TABLET | Freq: Every day | ORAL | 0 refills | Status: DC

## 2018-03-04 MED ORDER — IRBESARTAN 300 MG PO TABS
300.0000 mg | ORAL_TABLET | Freq: Every day | ORAL | Status: DC
  Administered 2018-03-04: 300 mg via ORAL
  Filled 2018-03-04: qty 1

## 2018-03-04 NOTE — Progress Notes (Signed)
CSW faxed updated PT notes and discharge summary to [facility]. Per PT: Patient is not requiring assistance with walking at this time.  CSW confirm with [facility] the information was received.  Gwyndolyn Saxon states Clinical biochemist of nursing will call patient and sister tomorrow.  Patient to d/c home.   Kathrin Greathouse, Latanya Presser, MSW Clinical Social Worker  [phone] 03/04/2018  12:27 PM

## 2018-03-04 NOTE — Progress Notes (Addendum)
CSW called [facility] and confirm fax number [phone]. CSW had patient complete and sign a consent form. Information faxed/confirmation notice placed on chart. Patient information under review.   Patient will not be able to d/c to [facility]  If he is requiring assistance with ambulating.  PT consult has been ordered.   Patient to d/c home until he can admit to [facility] on Wednesday.   Kathrin Greathouse, Latanya Presser, MSW Clinical Social Worker  [phone] 03/04/2018  10:15 AM

## 2018-03-04 NOTE — Care Management Note (Signed)
Case Management Note  Patient Details  Name: Jabri Blancett MRN: [id number] Date of Birth: [birth year]  Subjective/Objective:  62 y/o m admitted w/Chronic alcohol abuse. From home.Noted 1+asst PT cons-await recc. CSW following for etoh resources.                  Action/Plan:d/c plan home/[facility].   Expected Discharge Date:  03/03/18               Expected Discharge Plan:  Home/Self Care  In-House Referral:  Clinical Social Work  Discharge planning Services  CM Consult  Post Acute Care Choice:    Choice offered to:     DME Arranged:    DME Agency:     HH Arranged:    HH Agency:     Status of Service:  In process, will continue to follow  If discussed at Long Length of Stay Meetings, dates discussed:    Additional Comments:  Dessa Phi, RN 03/04/2018, 10:31 AM

## 2018-03-04 NOTE — Evaluation (Signed)
Physical Therapy One Time Evaluation Patient Details Name: Oscar Phillips MRN: [id number] DOB: March 13, 1956 Today's Date: 03/04/2018   History of Present Illness  62 y.o. male with medical history significant for chronic alcoholism, nicotine abuse, COPD, anxiety who presented to the emergency department with complaints of worsening anxiety and panic attacks; pt admitted for Chronic alcohol abuse/ acute alcohol withdrawal  Clinical Impression  Patient evaluated by Physical Therapy with no further acute PT needs identified. All education has been completed and the patient has no further questions. Pt ambulated good distance in hallway and did not require any physical assistance. See below for any follow-up Physical Therapy or equipment needs.  Plan currently for d/c to [facility] per notes. PT is signing off. Thank you for this referral.     Follow Up Recommendations No PT follow up    Equipment Recommendations  Other (comment)(pt agreeable to request SPC or RW if needed however declines need at this time)    Recommendations for Other Services       Precautions / Restrictions Precautions Precautions: Fall Restrictions Weight Bearing Restrictions: No      Mobility  Bed Mobility Overal bed mobility: Modified Independent                Transfers Overall transfer level: Needs assistance Equipment used: None Transfers: Sit to/from Stand Sit to Stand: Supervision            Ambulation/Gait Ambulation/Gait assistance: Min guard;Supervision Ambulation Distance (Feet): 350 Feet Assistive device: None Gait Pattern/deviations: Decreased stride length;Step-through pattern     General Gait Details: mildly unsteady however no overt LOB or assistance required, pt denies any hx of falls, agreeable to use SPC or RW if unsteadiness worsens (pt feels this is due to his new medications) however declines need for assistive device at this time.  Stairs             Wheelchair Mobility    Modified Rankin (Stroke Patients Only)       Balance Overall balance assessment: Mild deficits observed, not formally tested(denies hx of falls)  Pt able to start/stop and walk backwards 20 feet without LOB or physical assistance.                                           Pertinent Vitals/Pain Pain Assessment: No/denies pain    Home Living Family/patient expects to be discharged to:: Other (Comment)([facility] ) Living Arrangements: Alone                    Prior Function Level of Independence: Independent               Hand Dominance        Extremity/Trunk Assessment   Upper Extremity Assessment Upper Extremity Assessment: Overall WFL for tasks assessed    Lower Extremity Assessment Lower Extremity Assessment: Overall WFL for tasks assessed    Cervical / Trunk Assessment Cervical / Trunk Assessment: Normal  Communication   Communication: No difficulties  Cognition Arousal/Alertness: Awake/alert Behavior During Therapy: WFL for tasks assessed/performed Overall Cognitive Status: Within Functional Limits for tasks assessed                                        General Comments      Exercises  Assessment/Plan    PT Assessment Patent does not need any further PT services  PT Problem List         PT Treatment Interventions      PT Goals (Current goals can be found in the Care Plan section)  Acute Rehab PT Goals PT Goal Formulation: All assessment and education complete, DC therapy    Frequency     Barriers to discharge        Co-evaluation               AM-PAC PT "6 Clicks" Daily Activity  Outcome Measure Difficulty turning over in bed (including adjusting bedclothes, sheets and blankets)?: None Difficulty moving from lying on back to sitting on the side of the bed? : None Difficulty sitting down on and standing up from a chair with arms (e.g., wheelchair,  bedside commode, etc,.)?: None Help needed moving to and from a bed to chair (including a wheelchair)?: A Little Help needed walking in hospital room?: A Little Help needed climbing 3-5 steps with a railing? : A Little 6 Click Score: 21    End of Session   Activity Tolerance: Patient tolerated treatment well Patient left: in bed;with call bell/phone within reach;with family/visitor present Nurse Communication: Mobility status PT Visit Diagnosis: Difficulty in walking, not elsewhere classified (R26.2)    Time: 5956-3875 PT Time Calculation (min) (ACUTE ONLY): 8 min   Charges:   PT Evaluation $PT Eval Low Complexity: 1 Low     PT G CodesCarmelia Bake, PT, DPT 03/04/2018 Pager: [phone]  York Ram E 03/04/2018, 11:51 AM

## 2018-03-04 NOTE — Progress Notes (Signed)
D/c instructions reviewed w/ pt and sister. Both verbalize understanding and all questions answered. Pt belongings from security returned to pt. Pt d/c in w/c in stable condition by NT to brother's car. Pt in possession of d/c packet,scripts, and all personal belongings.

## 2018-03-04 NOTE — Discharge Instructions (Signed)

## 2018-03-04 NOTE — Plan of Care (Signed)
  Problem: Education: Goal: Knowledge of General Education information will improve Outcome: Completed/Met   Problem: Health Behavior/Discharge Planning: Goal: Ability to manage health-related needs will improve Outcome: Completed/Met   Problem: Clinical Measurements: Goal: Ability to maintain clinical measurements within normal limits will improve Outcome: Completed/Met Goal: Will remain free from infection Outcome: Completed/Met Goal: Diagnostic test results will improve Outcome: Completed/Met Goal: Respiratory complications will improve Outcome: Completed/Met Goal: Cardiovascular complication will be avoided Outcome: Completed/Met   Problem: Nutrition: Goal: Adequate nutrition will be maintained Outcome: Completed/Met   Problem: Safety: Goal: Ability to remain free from injury will improve Outcome: Completed/Met

## 2018-03-19 ENCOUNTER — Inpatient Hospital Stay (HOSPITAL_COMMUNITY)
Admission: EM | Admit: 2018-03-19 | Discharge: 2018-03-21 | DRG: 640 | Disposition: A | Discharge status: Home / Self Care | Payer: BLUE CROSS/BLUE SHIELD | Source: Other Acute Inpatient Hospital | Attending: Family Medicine | Admitting: Family Medicine

## 2018-03-19 ENCOUNTER — Other Ambulatory Visit: Payer: Self-pay

## 2018-03-19 ENCOUNTER — Encounter (HOSPITAL_COMMUNITY): Payer: Self-pay

## 2018-03-19 DIAGNOSIS — E876 Hypokalemia: Secondary | ICD-10-CM | POA: Diagnosis present

## 2018-03-19 DIAGNOSIS — E871 Hypo-osmolality and hyponatremia: Secondary | ICD-10-CM | POA: Diagnosis not present

## 2018-03-19 DIAGNOSIS — Z880 Allergy status to penicillin: Secondary | ICD-10-CM

## 2018-03-19 DIAGNOSIS — F13931 Sedative, hypnotic or anxiolytic use, unspecified with withdrawal delirium: Secondary | ICD-10-CM

## 2018-03-19 DIAGNOSIS — F13231 Sedative, hypnotic or anxiolytic dependence with withdrawal delirium: Secondary | ICD-10-CM | POA: Diagnosis not present

## 2018-03-19 DIAGNOSIS — F17210 Nicotine dependence, cigarettes, uncomplicated: Secondary | ICD-10-CM | POA: Diagnosis present

## 2018-03-19 DIAGNOSIS — K219 Gastro-esophageal reflux disease without esophagitis: Secondary | ICD-10-CM | POA: Diagnosis present

## 2018-03-19 DIAGNOSIS — F10232 Alcohol dependence with withdrawal with perceptual disturbance: Secondary | ICD-10-CM | POA: Diagnosis present

## 2018-03-19 DIAGNOSIS — F10931 Alcohol use, unspecified with withdrawal delirium: Secondary | ICD-10-CM | POA: Diagnosis present

## 2018-03-19 DIAGNOSIS — E785 Hyperlipidemia, unspecified: Secondary | ICD-10-CM | POA: Diagnosis present

## 2018-03-19 DIAGNOSIS — I10 Essential (primary) hypertension: Secondary | ICD-10-CM | POA: Diagnosis not present

## 2018-03-19 DIAGNOSIS — G9341 Metabolic encephalopathy: Secondary | ICD-10-CM | POA: Diagnosis present

## 2018-03-19 DIAGNOSIS — R443 Hallucinations, unspecified: Secondary | ICD-10-CM | POA: Diagnosis not present

## 2018-03-19 DIAGNOSIS — I252 Old myocardial infarction: Secondary | ICD-10-CM

## 2018-03-19 DIAGNOSIS — Z7952 Long term (current) use of systemic steroids: Secondary | ICD-10-CM | POA: Diagnosis not present

## 2018-03-19 DIAGNOSIS — F411 Generalized anxiety disorder: Secondary | ICD-10-CM

## 2018-03-19 DIAGNOSIS — Z800 Family history of malignant neoplasm of digestive organs: Secondary | ICD-10-CM | POA: Diagnosis not present

## 2018-03-19 DIAGNOSIS — R410 Disorientation, unspecified: Secondary | ICD-10-CM | POA: Diagnosis not present

## 2018-03-19 DIAGNOSIS — Z8349 Family history of other endocrine, nutritional and metabolic diseases: Secondary | ICD-10-CM

## 2018-03-19 DIAGNOSIS — E538 Deficiency of other specified B group vitamins: Secondary | ICD-10-CM

## 2018-03-19 DIAGNOSIS — R4182 Altered mental status, unspecified: Secondary | ICD-10-CM

## 2018-03-19 DIAGNOSIS — J449 Chronic obstructive pulmonary disease, unspecified: Secondary | ICD-10-CM | POA: Diagnosis present

## 2018-03-19 DIAGNOSIS — F10231 Alcohol dependence with withdrawal delirium: Secondary | ICD-10-CM | POA: Diagnosis present

## 2018-03-19 LAB — URINALYSIS, ROUTINE W REFLEX MICROSCOPIC
Bilirubin Urine: NEGATIVE
Glucose, UA: NEGATIVE mg/dL
Hgb urine dipstick: NEGATIVE
Ketones, ur: 5 mg/dL — AB
Leukocytes, UA: NEGATIVE
Nitrite: NEGATIVE
Protein, ur: NEGATIVE mg/dL
Specific Gravity, Urine: 1.01 (ref 1.005–1.030)
pH: 8 (ref 5.0–8.0)

## 2018-03-19 LAB — COMPREHENSIVE METABOLIC PANEL
ALT: 74 U/L — ABNORMAL HIGH (ref 17–63)
AST: 58 U/L — ABNORMAL HIGH (ref 15–41)
Albumin: 4.4 g/dL (ref 3.5–5.0)
Alkaline Phosphatase: 58 U/L (ref 38–126)
Anion gap: 11 (ref 5–15)
BUN: 11 mg/dL (ref 6–20)
CO2: 25 mmol/L (ref 22–32)
Calcium: 9.4 mg/dL (ref 8.9–10.3)
Chloride: 88 mmol/L — ABNORMAL LOW (ref 101–111)
Creatinine, Ser: 0.7 mg/dL (ref 0.61–1.24)
GFR calc Af Amer: >60 mL/min (ref >60)
GFR calc non Af Amer: >60 mL/min (ref >60)
Glucose, Bld: 127 mg/dL — ABNORMAL HIGH (ref 65–99)
Potassium: 3.8 mmol/L (ref 3.5–5.1)
Sodium: 124 mmol/L — ABNORMAL LOW (ref 135–145)
Total Bilirubin: 1.2 mg/dL (ref 0.3–1.2)
Total Protein: 8 g/dL (ref 6.5–8.1)

## 2018-03-19 LAB — CBC WITH DIFFERENTIAL/PLATELET
Basophils Absolute: 0 10*3/uL (ref 0.0–0.1)
Basophils Relative: 0 %
Eosinophils Absolute: 0 10*3/uL (ref 0.0–0.7)
Eosinophils Relative: 0 %
HCT: 45 % (ref 39.0–52.0)
Hemoglobin: 16.1 g/dL (ref 13.0–17.0)
Lymphocytes Relative: 10 %
Lymphs Abs: 1.5 10*3/uL (ref 0.7–4.0)
MCH: 32.9 pg (ref 26.0–34.0)
MCHC: 35.8 g/dL (ref 30.0–36.0)
MCV: 92 fL (ref 78.0–100.0)
Monocytes Absolute: 0.8 10*3/uL (ref 0.1–1.0)
Monocytes Relative: 5 %
Neutro Abs: 12.6 10*3/uL — ABNORMAL HIGH (ref 1.7–7.7)
Neutrophils Relative %: 85 %
Platelets: 460 10*3/uL — ABNORMAL HIGH (ref 150–400)
RBC: 4.89 MIL/uL (ref 4.22–5.81)
RDW: 12.9 % (ref 11.5–15.5)
WBC: 14.9 10*3/uL — ABNORMAL HIGH (ref 4.0–10.5)

## 2018-03-19 LAB — RAPID URINE DRUG SCREEN, HOSP PERFORMED
Amphetamines: NOT DETECTED
Barbiturates: NOT DETECTED
Benzodiazepines: NOT DETECTED
Cocaine: NOT DETECTED
Opiates: NOT DETECTED
Tetrahydrocannabinol: NOT DETECTED

## 2018-03-19 LAB — ETHANOL: Alcohol, Ethyl (B): <10 mg/dL (ref <10)

## 2018-03-19 MED ORDER — ALBUTEROL SULFATE (2.5 MG/3ML) 0.083% IN NEBU: 2.5000 mg | INHALATION_SOLUTION | Freq: Four times a day (QID) | RESPIRATORY_TRACT | Status: DC | PRN

## 2018-03-19 MED ORDER — SODIUM CHLORIDE 0.9 % IV BOLUS
500.0000 mL | Freq: Once | INTRAVENOUS | Status: AC
  Administered 2018-03-19: 500 mL via INTRAVENOUS

## 2018-03-19 MED ORDER — ADULT MULTIVITAMIN W/MINERALS CH
1.0000 | ORAL_TABLET | Freq: Every day | ORAL | Status: DC
  Administered 2018-03-20 – 2018-03-21 (×2): 1 via ORAL
  Filled 2018-03-19 (×2): qty 1

## 2018-03-19 MED ORDER — LORAZEPAM 1 MG PO TABS: 0.0000 mg | ORAL_TABLET | Freq: Two times a day (BID) | ORAL | Status: DC

## 2018-03-19 MED ORDER — ONDANSETRON HCL 4 MG/2ML IJ SOLN: 4.0000 mg | Freq: Four times a day (QID) | INTRAMUSCULAR | Status: DC | PRN

## 2018-03-19 MED ORDER — IRBESARTAN 150 MG PO TABS
150.0000 mg | ORAL_TABLET | Freq: Every day | ORAL | Status: DC
  Administered 2018-03-20 – 2018-03-21 (×2): 150 mg via ORAL
  Filled 2018-03-19 (×2): qty 1

## 2018-03-19 MED ORDER — LORAZEPAM 2 MG/ML IJ SOLN: 0.0000 mg | Freq: Four times a day (QID) | INTRAMUSCULAR | Status: DC

## 2018-03-19 MED ORDER — LORAZEPAM 1 MG PO TABS: 1.0000 mg | ORAL_TABLET | Freq: Four times a day (QID) | ORAL | Status: DC | PRN

## 2018-03-19 MED ORDER — IMIPRAMINE HCL 50 MG PO TABS
150.0000 mg | ORAL_TABLET | Freq: Every day | ORAL | Status: DC
  Administered 2018-03-19 – 2018-03-20 (×2): 150 mg via ORAL
  Filled 2018-03-19 (×3): qty 3

## 2018-03-19 MED ORDER — LORAZEPAM 2 MG/ML IJ SOLN: 0.0000 mg | Freq: Two times a day (BID) | INTRAMUSCULAR | Status: DC

## 2018-03-19 MED ORDER — ALBUTEROL SULFATE HFA 108 (90 BASE) MCG/ACT IN AERS: 2.0000 | INHALATION_SPRAY | Freq: Four times a day (QID) | RESPIRATORY_TRACT | Status: DC | PRN

## 2018-03-19 MED ORDER — ACETAMINOPHEN 650 MG RE SUPP: 650.0000 mg | Freq: Four times a day (QID) | RECTAL | Status: DC | PRN

## 2018-03-19 MED ORDER — CYANOCOBALAMIN 1000 MCG/ML IJ SOLN
1000.0000 ug | INTRAMUSCULAR | Status: DC
  Filled 2018-03-19: qty 1

## 2018-03-19 MED ORDER — THIAMINE HCL 100 MG/ML IJ SOLN
Freq: Once | INTRAVENOUS | Status: AC
  Administered 2018-03-19: 23:00:00 via INTRAVENOUS
  Filled 2018-03-19: qty 1000

## 2018-03-19 MED ORDER — LORAZEPAM 1 MG PO TABS: 0.0000 mg | ORAL_TABLET | Freq: Four times a day (QID) | ORAL | Status: DC

## 2018-03-19 MED ORDER — LORAZEPAM 2 MG/ML IJ SOLN
1.0000 mg | Freq: Four times a day (QID) | INTRAMUSCULAR | Status: DC | PRN
  Administered 2018-03-20 – 2018-03-21 (×2): 1 mg via INTRAVENOUS
  Filled 2018-03-19 (×2): qty 1

## 2018-03-19 MED ORDER — AMLODIPINE BESYLATE 10 MG PO TABS
10.0000 mg | ORAL_TABLET | Freq: Every day | ORAL | Status: DC
  Administered 2018-03-19 – 2018-03-21 (×3): 10 mg via ORAL
  Filled 2018-03-19 (×2): qty 1
  Filled 2018-03-19: qty 2

## 2018-03-19 MED ORDER — LORAZEPAM 1 MG PO TABS
0.0000 mg | ORAL_TABLET | Freq: Four times a day (QID) | ORAL | Status: DC
  Administered 2018-03-19 – 2018-03-20 (×2): 2 mg via ORAL
  Administered 2018-03-21: 4 mg via ORAL
  Filled 2018-03-19: qty 2
  Filled 2018-03-19: qty 4
  Filled 2018-03-19: qty 2

## 2018-03-19 MED ORDER — PROPRANOLOL HCL 10 MG PO TABS
10.0000 mg | ORAL_TABLET | Freq: Three times a day (TID) | ORAL | Status: DC | PRN
  Filled 2018-03-19 (×2): qty 1

## 2018-03-19 MED ORDER — ENOXAPARIN SODIUM 40 MG/0.4ML ~~LOC~~ SOLN
40.0000 mg | Freq: Every day | SUBCUTANEOUS | Status: DC
  Administered 2018-03-19 – 2018-03-20 (×2): 40 mg via SUBCUTANEOUS
  Filled 2018-03-19 (×2): qty 0.4

## 2018-03-19 MED ORDER — VITAMIN B-1 100 MG PO TABS
100.0000 mg | ORAL_TABLET | Freq: Every day | ORAL | Status: DC
  Administered 2018-03-20 – 2018-03-21 (×2): 100 mg via ORAL
  Filled 2018-03-19 (×2): qty 1

## 2018-03-19 MED ORDER — PREDNISONE 20 MG PO TABS
40.0000 mg | ORAL_TABLET | Freq: Every day | ORAL | Status: DC
  Administered 2018-03-20 – 2018-03-21 (×2): 40 mg via ORAL
  Filled 2018-03-19 (×2): qty 2

## 2018-03-19 MED ORDER — ONDANSETRON HCL 4 MG PO TABS: 4.0000 mg | ORAL_TABLET | Freq: Four times a day (QID) | ORAL | Status: DC | PRN

## 2018-03-19 MED ORDER — NICOTINE 21 MG/24HR TD PT24
21.0000 mg | MEDICATED_PATCH | Freq: Every day | TRANSDERMAL | Status: DC
  Administered 2018-03-19 – 2018-03-21 (×3): 21 mg via TRANSDERMAL
  Filled 2018-03-19 (×3): qty 1

## 2018-03-19 MED ORDER — ACETAMINOPHEN 325 MG PO TABS: 650.0000 mg | ORAL_TABLET | Freq: Four times a day (QID) | ORAL | Status: DC | PRN

## 2018-03-19 NOTE — ED Notes (Signed)
Confusion noted, requiring frequent redirection and encouragement. Pt asking the same questions over and over. This nurse notified EDP [place] of pt behavior.

## 2018-03-19 NOTE — ED Notes (Signed)
Pt to room #40. Pt reports he is from [employer]. "I have to stop drinking." Denies s/s of withdrawal. Pt behavior cooperative, pleasant on approach. Pt denies SI/HI/AVH. Pt acknowledges disorganized thought process. Pt blaming [facility]mate at [facility] for being here. Encouragement and support provided. Special checks q 15 mins in place for safety, Video monitoring in place. Will continue to monitor.

## 2018-03-19 NOTE — BH Assessment (Signed)
Spoke with Margaretha Sheffield at [facility] to let her know the pt has been admitted to the hospital.  TTS reported to the RN that it is unclear at this time when the pt will be discharged.

## 2018-03-19 NOTE — ED Notes (Signed)
EDP at bedside  

## 2018-03-19 NOTE — ED Notes (Signed)
EKG given to EDP [place] for review, no new orders given.

## 2018-03-19 NOTE — ED Notes (Signed)
This nurse notified psych:Dr Mariea Clonts in regards to pt behavior.

## 2018-03-19 NOTE — H&P (Signed)
History and Physical    Nisaiah Bechtol CZY:[id number] DOB: 31-Oct-1955 DOA: 03/19/2018  PCP: Janith Lima, MD  Patient coming from: Home  I have personally briefly reviewed patient's old medical records in [place]  Chief Complaint: EtOH withdrawal  HPI: Lang Zingg is a 62 y.o. male with medical history significant of EtOH abuse.  Recent admit to our service for AMS, hyponatremia due to beer potomania, EtOH intoxication and delirium.  Hyponatremia resolved with NS gentle hydration and delirium resolved on CIWA.  Since then started drinking again with last drink on Thurs last week he says.  Today sent in from Detox for AMS, increased confusion, repetitiveness.  Patient is tremulous and forgetful.  Denies auditory or visual hallucinations.  Has a healing laceration on the left side of his tongue which he does not remember.  Review of his labs show sodium of 124.  States he drinks roughly a 12 pack of light beer daily.  Recently admitted to [facility] receiving Ativan for alcohol withdrawal.  Patient is also on Inderal for anxiety.   ED Course: Sodium 124.  Tremor and confusion / repetitiveness noted.  Vital signs normal but with tongue lac, AMS, tremor.  EDP concerned that he maybe going through DTs without tachycardia due to propanolol.   Review of Systems: As per HPI otherwise 10 point review of systems negative.   Past Medical History:  Diagnosis Date  . Abdominal bruit   . Alcoholism ([place])   . Anxiety   . B12 deficiency   . Broken arm    left arm/has metal splint/06/16/15  . Bronchitis   . COPD (chronic obstructive pulmonary disease) ([place])   . GERD (gastroesophageal reflux disease)   . H/O trichomonal urethritis   . History of panic attacks   . History of rectal bleeding    in August 2016  . HLD (hyperlipidemia)   . HTN (hypertension)   . Myocardial infarction ([place])    Pt unsure/no symptoms in 05/2015  . OCD (obsessive compulsive disorder)   .  PSA elevation     Past Surgical History:  Procedure Laterality Date  . ELBOW FRACTURE SURGERY Left   . LUMBAR DISC SURGERY    . TONSILLECTOMY  1965     reports that he has been smoking cigarettes.  He has a 25.00 pack-year smoking history. He has never used smokeless tobacco. He reports that he drinks about 9.0 oz of alcohol per week. He reports that he does not use drugs.  Allergies  Allergen Reactions  . Penicillins     Has patient had a PCN reaction causing immediate rash, facial/tongue/throat swelling, SOB or lightheadedness with hypotension: Yes Has patient had a PCN reaction causing severe rash involving mucus membranes or skin necrosis: No Has patient had a PCN reaction that required hospitalization: Unknown Has patient had a PCN reaction occurring within the last 10 years: Unknown If all of the above answers are "NO", then may proceed with Cephalosporin use.     Family History  Problem Relation Age of Onset  . Hyperlipidemia Father   . Colon cancer Father   . Stomach cancer Father   . Dementia Mother   . Alcohol abuse Maternal Uncle   . Lung cancer Maternal Uncle   . Heart failure Paternal Grandmother   . Emphysema Paternal Grandmother   . Diabetes Neg Hx   . Drug abuse Neg Hx   . Early death Neg Hx   . Heart disease Neg Hx   .  Hypertension Neg Hx   . Kidney disease Neg Hx   . Stroke Neg Hx      Prior to Admission medications   Medication Sig Start Date End Date Taking? Authorizing Provider  albuterol (PROVENTIL HFA;VENTOLIN HFA) 108 (90 Base) MCG/ACT inhaler Inhale 2 puffs into the lungs every 6 (six) hours as needed for wheezing or shortness of breath. 03/03/18  Yes Adhikari, Amrit, MD  amLODipine (NORVASC) 10 MG tablet Take 1 tablet (10 mg total) by mouth daily. 03/04/18  Yes Shelly Coss, MD  imipramine (TOFRANIL) 50 MG tablet Take 150 mg by mouth at bedtime.  05/12/15  Yes [provider]  Multiple Vitamins-Minerals (MULTIVITAMIN ADULT PO) Take 1  tablet by mouth daily.   Yes [provider]  predniSONE (DELTASONE) 20 MG tablet Take 40 mg by mouth daily with breakfast.   Yes [provider]  propranolol (INDERAL) 10 MG tablet Take 10 mg by mouth 3 (three) times daily as needed (anxiety).   Yes [provider]  thiamine 100 MG tablet Take 1 tablet (100 mg total) by mouth daily. 03/04/18  Yes Shelly Coss, MD  valsartan (DIOVAN) 160 MG tablet Take 1 tablet (160 mg total) by mouth daily. 03/04/18  Yes Shelly Coss, MD  nicotine (NICODERM CQ - DOSED IN MG/24 HOURS) 21 mg/24hr patch Place 1 patch (21 mg total) onto the skin daily. Patient not taking: Reported on 03/19/2018 03/04/18   Shelly Coss, MD    Physical Exam: Vitals:   03/19/18 0948 03/19/18 1306 03/19/18 1658 03/19/18 1907  BP: (!) 158/81 (!) 145/79 (!) 163/80 132/69  Pulse: 81 93 98 95  Resp: 18 18 18 18   Temp: 98.3 F (36.8 C)  98 F (36.7 C)   TempSrc: Oral  Oral   SpO2: 97% 97% 97% 100%  Weight:      Height:        Constitutional: NAD, calm, comfortable Eyes: PERRL, lids and conjunctivae normal ENMT: Mucous membranes are moist. Posterior pharynx clear of any exudate or lesions.Normal dentition.  Neck: normal, supple, no masses, no thyromegaly Respiratory: clear to auscultation bilaterally, no wheezing, no crackles. Normal respiratory effort. No accessory muscle use.  Cardiovascular: Regular rate and rhythm, no murmurs / rubs / gallops. No extremity edema. 2+ pedal pulses. No carotid bruits.  Abdomen: no tenderness, no masses palpated. No hepatosplenomegaly. Bowel sounds positive.  Musculoskeletal: no clubbing / cyanosis. No joint deformity upper and lower extremities. Good ROM, no contractures. Normal muscle tone.  Skin: no rashes, lesions, ulcers. No induration Neurologic: CN 2-12 grossly intact. Sensation intact, DTR normal. Strength 5/5 in all 4.  Psychiatric: Normal judgment and insight. Alert and oriented x 3. Normal mood.     Labs on Admission: I have personally reviewed following labs and imaging studies  CBC: Recent Labs  Lab 03/19/18 1139  WBC 14.9*  NEUTROABS 12.6*  HGB 16.1  HCT 45.0  MCV 92.0  PLT 998*   Basic Metabolic Panel: Recent Labs  Lab 03/19/18 1139  NA 124*  K 3.8  CL 88*  CO2 25  GLUCOSE 127*  BUN 11  CREATININE 0.70  CALCIUM 9.4   GFR: Estimated Creatinine Clearance: 90.3 mL/min (by C-G formula based on SCr of 0.7 mg/dL). Liver Function Tests: Recent Labs  Lab 03/19/18 1139  AST 58*  ALT 74*  ALKPHOS 58  BILITOT 1.2  PROT 8.0  ALBUMIN 4.4   No results for input(s): LIPASE, AMYLASE in the last 168 hours. No results for input(s): AMMONIA  in the last 168 hours. Coagulation Profile: No results for input(s): INR, PROTIME in the last 168 hours. Cardiac Enzymes: No results for input(s): CKTOTAL, CKMB, CKMBINDEX, TROPONINI in the last 168 hours. BNP (last 3 results) No results for input(s): PROBNP in the last 8760 hours. HbA1C: No results for input(s): HGBA1C in the last 72 hours. CBG: No results for input(s): GLUCAP in the last 168 hours. Lipid Profile: No results for input(s): CHOL, HDL, LDLCALC, TRIG, CHOLHDL, LDLDIRECT in the last 72 hours. Thyroid Function Tests: No results for input(s): TSH, T4TOTAL, FREET4, T3FREE, THYROIDAB in the last 72 hours. Anemia Panel: No results for input(s): VITAMINB12, FOLATE, FERRITIN, TIBC, IRON, RETICCTPCT in the last 72 hours. Urine analysis:    Component Value Date/Time   COLORURINE YELLOW 03/19/2018 [place] 03/19/2018 1515   LABSPEC 1.010 03/19/2018 1515   PHURINE 8.0 03/19/2018 1515   GLUCOSEU NEGATIVE 03/19/2018 1515   GLUCOSEU NEGATIVE 05/24/2016 1036   HGBUR NEGATIVE 03/19/2018 1515   BILIRUBINUR NEGATIVE 03/19/2018 1515   KETONESUR 5 (A) 03/19/2018 1515   PROTEINUR NEGATIVE 03/19/2018 1515   UROBILINOGEN 0.2 05/24/2016 1036   NITRITE NEGATIVE 03/19/2018 1515   LEUKOCYTESUR NEGATIVE  03/19/2018 1515    Radiological Exams on Admission: No results found.  EKG: Independently reviewed.  Assessment/Plan Principal Problem:   Alcohol withdrawal, with perceptual disturbance (HCC) Active Problems:   Anxiety state   Essential hypertension   Hyponatremia    1. EtOH withdrawal - Probably DTs with vital signs blunted by beta blocker. 1. CIWA 2. Tele monitor 2. Anxiety - 1. Continue Inderal 3. HTN - continue home meds 4. Hyponatremia - 1. Likely due to beer potomania 2. Gentle hydration 3. Repeat BMP in AM  DVT prophylaxis: Lovenox Code Status: Full Family Communication: No family in room Disposition Plan: Home after detoxed Consults called: None Admission status: Admit to inpatient - likely will need multiple days of detox from EtOH, in DTs   GARDNER, [facility] Pager [phone] Only works nights!  If 7AM-7PM, please contact the primary day team physician taking care of patient  [email or web address] Password [facility]  03/19/2018, 8:53 PM

## 2018-03-19 NOTE — ED Notes (Signed)
Bed: WA08 Expected date:  Expected time:  Means of arrival:  Comments: 62 yo from [place], confused

## 2018-03-19 NOTE — ED Notes (Signed)
Specimen cup provided, pt encouraged to provide urine  

## 2018-03-19 NOTE — ED Notes (Signed)
Tried report x1. RN reported she needed 15 minutes.

## 2018-03-19 NOTE — ED Provider Notes (Signed)
[facility] DEPT Provider Note   CSN: [id number] Arrival date & time: 03/19/18  0539     History   Chief Complaint Chief Complaint  Patient presents with  . Altered Mental Status    HPI Oscar Phillips is a 62 y.o. male.  HPI   "I'm basically here for anxiety."   61[facility] behavior. Pt is not a good historian. It is hard for me to follow-up this line of thought and he becomes evasive or dismissive when asked to clarify with more direct questioning. He says he does feel anxious although has for years and is on medication. Says he has not had a drink in days. Denies illicit drug use. Denies hallucinations. Apparently had been at [facility] for several days before he started acting in this manner.   Past Medical History:  Diagnosis Date  . Abdominal bruit   . Alcoholism ([place])   . Anxiety   . B12 deficiency   . Broken arm    left arm/has metal splint/06/16/15  . Bronchitis   . COPD (chronic obstructive pulmonary disease) ([place])   . GERD (gastroesophageal reflux disease)   . H/O trichomonal urethritis   . History of panic attacks   . History of rectal bleeding    in August 2016  . HLD (hyperlipidemia)   . HTN (hypertension)   . Myocardial infarction ([place])    Pt unsure/no symptoms in 05/2015  . OCD (obsessive compulsive disorder)   . PSA elevation     Patient Active Problem List   Diagnosis Date Noted  . Chronic alcohol abuse 03/02/2018  . Hyponatremia 03/02/2018  . Elevated liver enzymes 03/02/2018  . Alcohol abuse with intoxication delirium ([place]) 03/02/2018  . OCD (obsessive compulsive disorder) 04/22/2017  . COPD exacerbation ([place]) 04/20/2017  . Colon cancer screening 05/24/2016  . Abdominal bruit 05/24/2016  . Trichomonal urethritis in male 05/24/2016  . PSA elevation 08/23/2015  . Cough 05/20/2015  . Hyperglycemia 05/20/2015  . Routine general medical examination at a health care  facility 05/20/2015  . Essential hypertension 05/20/2015  . 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 exacerbation ([place]) 05/20/2015  . Nonspecific abnormal electrocardiogram (ECG) (EKG) 05/20/2015  . Hyperlipidemia with target LDL less than 100 05/20/2015  . B12 deficiency 09/21/2010  . Irritable bowel syndrome 09/20/2010  . Anxiety state 06/03/2010  . TOBACCO USE 06/03/2010  . DEPRESSION 06/03/2010  . GERD 06/03/2010    Past Surgical History:  Procedure Laterality Date  . ELBOW FRACTURE SURGERY Left   . LUMBAR DISC SURGERY    . TONSILLECTOMY  1965        Home Medications    Prior to Admission medications   Medication Sig Start Date End Date Taking? Authorizing Provider  pregabalin (LYRICA) 150 MG capsule Take 150 mg by mouth 2 (two) times daily.   Yes [provider]  albuterol (PROVENTIL HFA;VENTOLIN HFA) 108 (90 Base) MCG/ACT inhaler Inhale 2 puffs into the lungs every 6 (six) hours as needed for wheezing or shortness of breath. 03/03/18   Shelly Coss, MD  ALPRAZolam Duanne Moron) 1 MG tablet Take 1 mg by mouth 2 (two) times daily as needed for anxiety.  05/03/15   [provider]  amLODipine (NORVASC) 10 MG tablet Take 1 tablet (10 mg total) by mouth daily. 03/04/18   Shelly Coss, MD  cyanocobalamin 2000 MCG tablet Take 1 tablet (2,000 mcg total) by mouth daily. Patient not taking: Reported on 03/02/2018 05/29/16  Janith Lima, MD  folic acid (FOLVITE) 1 MG tablet Take 1 tablet (1 mg total) by mouth daily. 03/03/18 04/02/18  Shelly Coss, MD  imipramine (TOFRANIL) 50 MG tablet Take 50 mg by mouth at bedtime.  05/12/15   [provider]  nicotine (NICODERM CQ - DOSED IN MG/24 HOURS) 21 mg/24hr patch Place 1 patch (21 mg total) onto the skin daily. 03/04/18   Shelly Coss, MD  thiamine 100 MG tablet Take 1 tablet (100 mg total) by mouth daily. 03/04/18   Shelly Coss, MD  valsartan (DIOVAN) 160 MG tablet Take 1 tablet (160 mg total) by mouth daily.  03/04/18   Shelly Coss, MD    Family History Family History  Problem Relation Age of Onset  . Hyperlipidemia Father   . Colon cancer Father   . Stomach cancer Father   . Dementia Mother   . Alcohol abuse Maternal Uncle   . Lung cancer Maternal Uncle   . Heart failure Paternal Grandmother   . Emphysema Paternal Grandmother   . Diabetes Neg Hx   . Drug abuse Neg Hx   . Early death Neg Hx   . Heart disease Neg Hx   . Hypertension Neg Hx   . Kidney disease Neg Hx   . Stroke Neg Hx     Social History Social History   Tobacco Use  . Smoking status: Current Every Day Smoker    Packs/day: 1.00    Years: 25.00    Pack years: 25.00    Types: Cigarettes  . Smokeless tobacco: Never Used  Substance Use Topics  . Alcohol use: Yes    Alcohol/week: 9.0 oz    Types: 15 Glasses of wine per week  . Drug use: No     Allergies   Penicillins   Review of Systems Review of Systems  All systems reviewed and negative, other than as noted in HPI.  Physical Exam Updated Vital Signs BP (!) 158/81 (BP Location: Left Arm)   Pulse 81   Temp 98.3 F (36.8 C) (Oral)   Resp 18   Ht 5\' 8"  (1.727 m)   Wt 66.7 kg (147 lb)   SpO2 97%   BMI 22.35 kg/m   Physical Exam  Constitutional: He appears well-developed and well-nourished. No distress.  HENT:  Head: Normocephalic and atraumatic.  Eyes: Conjunctivae are normal. Right eye exhibits no discharge. Left eye exhibits no discharge.  Neck: Neck supple.  Cardiovascular: Normal rate, regular rhythm and normal heart sounds. Exam reveals no gallop and no friction rub.  No murmur heard. Pulmonary/Chest: Effort normal and breath sounds normal. No respiratory distress.  Abdominal: Soft. He exhibits no distension. There is no tenderness.  Musculoskeletal: He exhibits no edema or tenderness.  Neurological: He is alert.  Skin: Skin is warm and dry.  Psychiatric:  Calm. Speech clear. Thoughts disorganized and seems to frustrate easily.     Nursing note and vitals reviewed.    ED Treatments / Results  Labs (all labs ordered are listed, but only abnormal results are displayed) Labs Reviewed  CBC WITH DIFFERENTIAL/PLATELET - Abnormal; Notable for the following components:      Result Value   WBC 14.9 (*)    Platelets 460 (*)    Neutro Abs 12.6 (*)    All other components within normal limits  COMPREHENSIVE METABOLIC PANEL - Abnormal; Notable for the following components:   Sodium 124 (*)    Chloride 88 (*)    Glucose, Bld 127 (*)  AST 58 (*)    ALT 74 (*)    All other components within normal limits  URINALYSIS, ROUTINE W REFLEX MICROSCOPIC - Abnormal; Notable for the following components:   Ketones, ur 5 (*)    All other components within normal limits  ETHANOL  RAPID URINE DRUG SCREEN, HOSP PERFORMED    EKG EKG Interpretation  Date/Time:  Tuesday March 19 2018 15:30:43 EDT Ventricular Rate:  87 PR Interval:  150 QRS Duration: 146 QT Interval:  418 QTC Calculation: 502 R Axis:   -76 Text Interpretation:  Normal sinus rhythm Possible Left atrial enlargement Left axis deviation Right bundle branch block Abnormal ECG Confirmed by Virgel Manifold [phone]) on 03/19/2018 3:35:49 PM   Radiology No results found.  Procedures Procedures (including critical care time)  Medications Ordered in ED Medications - No data to display   Initial Impression / Assessment and Plan / ED Course  I have reviewed the triage vital signs and the nursing notes.  Pertinent labs & imaging results that were available during my care of the patient were reviewed by me and considered in my medical decision making (see chart for details).     61yM with odd behavior. He doesn't seem to have much insight. Hard to get coherent history from him. He denies hallucinations and doesn't appear to be responding to internal stimuli. He is not tremulous.  Hyponatremia, but has been near this range previously (128,126, 132). Leukocytosis of  unclear significance. Afebrile. No overt infectious symptoms.   Final Clinical Impressions(s) / ED Diagnoses   Final diagnoses:  Altered mental status, unspecified altered mental status type    ED Discharge Orders    None       Virgel Manifold, MD 03/19/18 1536

## 2018-03-19 NOTE — BHH Counselor (Signed)
Patient signed consent to release info to [facility], his siblings and [facility]. Writer called and updated Scientist, water quality at [facility] of disposition recommendation. Marya Amsler reports they can save the bed for patient for two days. He asked that TTS let him know tomorrow's disposition recommendation. [phone].  Arnold Long, [place] [facility] Specialist

## 2018-03-19 NOTE — ED Triage Notes (Signed)
[facility] presented [facility] from [facility] with c/o confusion. A/O X 3. Patient admitted to [facility]  On the 6/6 and started on detox medication and wake up this morning with confusion.

## 2018-03-19 NOTE — ED Notes (Signed)
Bed: [facility] Expected date:  Expected time:  Means of arrival:  Comments: 8

## 2018-03-19 NOTE — ED Notes (Signed)
ED TO INPATIENT HANDOFF REPORT  Name/Age/Gender Oscar Phillips 62 y.o. male  Code Status    Code Status Orders  (From admission, onward)        Start     Ordered   03/19/18 2050  Full code  Continuous     03/19/18 2051    Code Status History    Date Active Date Inactive Code Status Order ID Comments User Context   03/19/2018 1113 03/19/2018 2051 Full Code [id number]  Virgel Manifold, MD ED   03/02/2018 1719 03/04/2018 1538 Full Code [id number]  Shelly Coss, MD ED   03/02/2018 1434 03/02/2018 1719 Full Code [id number]  Janee Morn ED      Home/SNF/Other [facility]  Chief Complaint anxiety  Level of Care/Admitting Diagnosis ED Disposition    ED Disposition Condition [place] [facility] Area: [facility] [[id number]]  Level of Care: Telemetry [5]  Admit to tele based on following criteria: Complex arrhythmia (Bradycardia/Tachycardia)  Diagnosis: Alcohol withdrawal, with perceptual disturbance [facility]) [[id number]]  Admitting Physician: Doreatha Massed  Attending Physician: Etta Quill [phone]  Estimated length of stay: past midnight tomorrow  Certification:: I certify this patient will need inpatient services for at least 2 midnights  PT Class (Do Not Modify): Inpatient [101]  PT Acc Code (Do Not Modify): Private [1]       Medical History Past Medical History:  Diagnosis Date  . Abdominal bruit   . Alcoholism ([place])   . Anxiety   . B12 deficiency   . Broken arm    left arm/has metal splint/06/16/15  . Bronchitis   . COPD (chronic obstructive pulmonary disease) ([place])   . GERD (gastroesophageal reflux disease)   . H/O trichomonal urethritis   . History of panic attacks   . History of rectal bleeding    in August 2016  . HLD (hyperlipidemia)   . HTN (hypertension)   . Myocardial infarction ([place])    Pt unsure/no symptoms in 05/2015  . OCD (obsessive compulsive disorder)   . PSA elevation      Allergies Allergies  Allergen Reactions  . Penicillins     Has patient had a PCN reaction causing immediate rash, facial/tongue/throat swelling, SOB or lightheadedness with hypotension: Yes Has patient had a PCN reaction causing severe rash involving mucus membranes or skin necrosis: No Has patient had a PCN reaction that required hospitalization: Unknown Has patient had a PCN reaction occurring within the last 10 years: Unknown If all of the above answers are "NO", then may proceed with Cephalosporin use.     IV Location/Drains/Wounds Patient Lines/Drains/Airways Status   Active Line/Drains/Airways    None          Labs/Imaging Results for orders placed or performed during the hospital encounter of 03/19/18 (from the past 48 hour(s))  Ethanol     Status: None   Collection Time: 03/19/18 11:39 AM  Result Value Ref Range   Alcohol, Ethyl (B) <10 <10 mg/dL    Comment: (NOTE) Lowest detectable limit for serum alcohol is 10 mg/dL. For medical purposes only. Performed at [facility], [place] [address], [place], [place] [address]   CBC with Differential     Status: Abnormal   Collection Time: 03/19/18 11:39 AM  Result Value Ref Range   WBC 14.9 (H) 4.0 - 10.5 K/uL   RBC 4.89 4.22 - 5.81 MIL/uL   Hemoglobin 16.1 13.0 - 17.0 g/dL   HCT 45.0 39.0 - 52.0 %  MCV 92.0 78.0 - 100.0 fL   MCH 32.9 26.0 - 34.0 pg   MCHC 35.8 30.0 - 36.0 g/dL   RDW 12.9 11.5 - 15.5 %   Platelets 460 (H) 150 - 400 K/uL   Neutrophils Relative % 85 %   Neutro Abs 12.6 (H) 1.7 - 7.7 K/uL   Lymphocytes Relative 10 %   Lymphs Abs 1.5 0.7 - 4.0 K/uL   Monocytes Relative 5 %   Monocytes Absolute 0.8 0.1 - 1.0 K/uL   Eosinophils Relative 0 %   Eosinophils Absolute 0.0 0.0 - 0.7 K/uL   Basophils Relative 0 %   Basophils Absolute 0.0 0.0 - 0.1 K/uL    Comment: Performed at [facility], [place] [address], [place], [place] [address]  Comprehensive metabolic panel      Status: Abnormal   Collection Time: 03/19/18 11:39 AM  Result Value Ref Range   Sodium 124 (L) 135 - 145 mmol/L   Potassium 3.8 3.5 - 5.1 mmol/L   Chloride 88 (L) 101 - 111 mmol/L   CO2 25 22 - 32 mmol/L   Glucose, Bld 127 (H) 65 - 99 mg/dL   BUN 11 6 - 20 mg/dL   Creatinine, Ser 0.70 0.61 - 1.24 mg/dL   Calcium 9.4 8.9 - 10.3 mg/dL   Total Protein 8.0 6.5 - 8.1 g/dL   Albumin 4.4 3.5 - 5.0 g/dL   AST 58 (H) 15 - 41 U/L   ALT 74 (H) 17 - 63 U/L   Alkaline Phosphatase 58 38 - 126 U/L   Total Bilirubin 1.2 0.3 - 1.2 mg/dL   GFR calc non Af Amer >60 >60 mL/min   GFR calc Af Amer >60 >60 mL/min    Comment: (NOTE) The eGFR has been calculated using the CKD EPI equation. This calculation has not been validated in all clinical situations. eGFR's persistently <60 mL/min signify possible Chronic Kidney Disease.    Anion gap 11 5 - 15    Comment: Performed at [facility], [place] [address], [place], [place] [address]  Urinalysis, Routine w reflex microscopic     Status: Abnormal   Collection Time: 03/19/18  3:15 PM  Result Value Ref Range   Color, Urine YELLOW YELLOW   APPearance CLEAR CLEAR   Specific Gravity, Urine 1.010 1.005 - 1.030   pH 8.0 5.0 - 8.0   Glucose, UA NEGATIVE NEGATIVE mg/dL   Hgb urine dipstick NEGATIVE NEGATIVE   Bilirubin Urine NEGATIVE NEGATIVE   Ketones, ur 5 (A) NEGATIVE mg/dL   Protein, ur NEGATIVE NEGATIVE mg/dL   Nitrite NEGATIVE NEGATIVE   Leukocytes, UA NEGATIVE NEGATIVE    Comment: Performed at [facility] [address], [place], [place] [address]  Rapid urine drug screen (hospital performed)     Status: None   Collection Time: 03/19/18  3:15 PM  Result Value Ref Range   Opiates NONE DETECTED NONE DETECTED   Cocaine NONE DETECTED NONE DETECTED   Benzodiazepines NONE DETECTED NONE DETECTED   Amphetamines NONE DETECTED NONE DETECTED   Tetrahydrocannabinol NONE DETECTED NONE DETECTED   Barbiturates NONE DETECTED  NONE DETECTED    Comment: (NOTE) DRUG SCREEN FOR MEDICAL PURPOSES ONLY.  IF CONFIRMATION IS NEEDED FOR ANY PURPOSE, NOTIFY LAB WITHIN 5 DAYS. LOWEST DETECTABLE LIMITS FOR URINE DRUG SCREEN Drug Class                     Cutoff (ng/mL) Amphetamine and metabolites  1000 Barbiturate and metabolites    200 Benzodiazepine                 128 Tricyclics and metabolites     300 Opiates and metabolites        300 Cocaine and metabolites        300 THC                            50 Performed at [facility], [place] [address], [place], [place] [address]    No results found.  Pending Labs Unresulted Labs (From admission, onward)   Start     Ordered   03/20/18 0500  CBC  Tomorrow morning,   R     03/19/18 2051   03/20/18 7209  Basic metabolic panel  Tomorrow morning,   R     03/19/18 2051      Vitals/Pain Today's Vitals   03/19/18 1454 03/19/18 1658 03/19/18 1907 03/19/18 2110  BP:  (!) 163/80 132/69 139/78  Pulse:  98 95 97  Resp:  '18 18 11  '$ Temp:  98 F (36.7 C)    TempSrc:  Oral    SpO2:  97% 100% 99%  Weight:      Height:      PainSc: 0-No pain       Isolation Precautions No active isolations  Medications Medications  amLODipine (NORVASC) tablet 10 mg (10 mg Oral Given 03/19/18 1504)  cyanocobalamin ((VITAMIN B-12)) injection 1,000 mcg (has no administration in time range)  imipramine (TOFRANIL) tablet 150 mg (has no administration in time range)  multivitamin with minerals tablet 1 tablet (has no administration in time range)  propranolol (INDERAL) tablet 10 mg (has no administration in time range)  thiamine (VITAMIN B-1) tablet 100 mg (has no administration in time range)  irbesartan (AVAPRO) tablet 150 mg (has no administration in time range)  nicotine (NICODERM CQ - dosed in mg/24 hours) patch 21 mg (21 mg Transdermal Patch Applied 03/19/18 1504)  sodium chloride 0.9 % 1,000 mL with thiamine 470 mg, folic acid 1 mg, multivitamins adult 10 mL  infusion (has no administration in time range)  acetaminophen (TYLENOL) tablet 650 mg (has no administration in time range)    Or  acetaminophen (TYLENOL) suppository 650 mg (has no administration in time range)  ondansetron (ZOFRAN) tablet 4 mg (has no administration in time range)    Or  ondansetron (ZOFRAN) injection 4 mg (has no administration in time range)  enoxaparin (LOVENOX) injection 40 mg (has no administration in time range)  predniSONE (DELTASONE) tablet 40 mg (has no administration in time range)  albuterol (PROVENTIL HFA;VENTOLIN HFA) 108 (90 Base) MCG/ACT inhaler 2 puff (has no administration in time range)  LORazepam (ATIVAN) tablet 1 mg (has no administration in time range)    Or  LORazepam (ATIVAN) injection 1 mg (has no administration in time range)  LORazepam (ATIVAN) tablet 0-4 mg (has no administration in time range)    Followed by  LORazepam (ATIVAN) tablet 0-4 mg (has no administration in time range)  sodium chloride 0.9 % bolus 500 mL (0 mLs Intravenous Stopped 03/19/18 1943)    Mobility walks

## 2018-03-19 NOTE — ED Provider Notes (Signed)
Asked to evaluate patient for increased confusion, repetitiveness.  Patient is tremulous and forgetful.  Denies auditory or visual hallucinations.  Has a healing laceration on the left side of his tongue which he does not remember.  Review of his labs show sodium of 124.  States he drinks roughly a 12 pack of light beer daily.  [facility].  Patient is also on Inderal for anxiety.  Believe the patient is still going through alcohol/benzodiazepine withdrawal with vital signs blunted by the use of the Inderal.  His hyponatremia may also be contributing to his confusion.  Discussed with hospitalist who will evaluate patient and admit.   Julianne Rice, MD 03/19/18 1924
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
## 2018-03-20 DIAGNOSIS — G9341 Metabolic encephalopathy: Secondary | ICD-10-CM

## 2018-03-20 LAB — CBC
HEMATOCRIT: 39.3 % (ref 39.0–52.0)
Hemoglobin: 13.7 g/dL (ref 13.0–17.0)
MCH: 31.8 pg (ref 26.0–34.0)
MCHC: 34.9 g/dL (ref 30.0–36.0)
MCV: 91.2 fL (ref 78.0–100.0)
PLATELETS: 359 10*3/uL (ref 150–400)
RBC: 4.31 MIL/uL (ref 4.22–5.81)
RDW: 12.9 % (ref 11.5–15.5)
WBC: 9.3 10*3/uL (ref 4.0–10.5)

## 2018-03-20 LAB — BASIC METABOLIC PANEL
ANION GAP: 9 (ref 5–15)
BUN: 11 mg/dL (ref 6–20)
CO2: 25 mmol/L (ref 22–32)
CREATININE: 0.72 mg/dL (ref 0.61–1.24)
Calcium: 8.6 mg/dL — ABNORMAL LOW (ref 8.9–10.3)
Chloride: 99 mmol/L — ABNORMAL LOW (ref 101–111)
GFR calc Af Amer: >60 mL/min (ref >60)
GLUCOSE: 88 mg/dL (ref 65–99)
Potassium: 2.9 mmol/L — ABNORMAL LOW (ref 3.5–5.1)
Sodium: 133 mmol/L — ABNORMAL LOW (ref 135–145)

## 2018-03-20 LAB — MAGNESIUM: MAGNESIUM: 1.9 mg/dL (ref 1.7–2.4)

## 2018-03-20 MED ORDER — POTASSIUM CHLORIDE 10 MEQ/100ML IV SOLN
10.0000 meq | INTRAVENOUS | Status: AC
  Administered 2018-03-20 (×3): 10 meq via INTRAVENOUS
  Filled 2018-03-20 (×3): qty 100

## 2018-03-20 NOTE — Progress Notes (Signed)
Initial Nutrition Assessment  INTERVENTION:   Provide Ensure Enlive po BID, each supplement provides 350 kcal and 20 grams of protein  NUTRITION DIAGNOSIS:   Increased nutrient needs related to (ETOH abuse) as evidenced by estimated needs.  GOAL:   Patient will meet greater than or equal to 90% of their needs  MONITOR:   PO intake, Supplement acceptance, Labs, Weight trends, I & O's  REASON FOR ASSESSMENT:   Malnutrition Screening Tool    ASSESSMENT:   62 y.o. male with medical history significant of EtOH abuse.  Recent admit to our service for AMS, hyponatremia due to beer potomania, EtOH intoxication and delirium.  Patient with AMS. Pt was drinking a 12 pack of beer daily PTA.  No PO intake recorded yet, pt on regular diet. Would most likely benefit from nutrition supplements given increased needs from ETOH abuse. Will order Ensure supplements. Per weight records, pt has lost 18 lb since July 2018 (11% wt loss x 11 months, insignificant for time frame).   Medications: Vitamin B-12 injection monthly, Multivitamin with minerals daily, Thiamine tablet daily Labs reviewed: Low Na, K Mg WNL   NUTRITION - FOCUSED PHYSICAL EXAM:  Nutrition focused physical exam shows no sign of depletion of muscle mass or body fat.  Diet Order:   Diet Order           Diet regular Room service appropriate? Yes; Fluid consistency: Thin  Diet effective now          EDUCATION NEEDS:   Not appropriate for education at this time  Skin:  Skin Assessment: Reviewed RN Assessment  Last BM:  6/10  Height:   Ht Readings from Last 1 Encounters:  03/19/18 5\' 8"  (1.727 m)    Weight:   Wt Readings from Last 1 Encounters:  03/19/18 147 lb (66.7 kg)    Ideal Body Weight:  70 kg  BMI:  Body mass index is 22.35 kg/m.  Estimated Nutritional Needs:   Kcal:  2000-2200  Protein:  90-100g  Fluid:  2L/day  Clayton Bibles, MS, RD, LDN [place] Dietitian Pager:  [phone] After Hours Pager: [phone]

## 2018-03-20 NOTE — Progress Notes (Signed)
PROGRESS NOTE [facility]   Oscar Phillips   IHK:[id number] DOB: May 05, 1956  DOA: 03/19/2018 PCP: Janith Lima, MD   Brief Narrative:  Oscar Phillips is a 62 year old male with medical history of alcohol abuse.  Patient presented to the emergency department with confusion.  Patient has been in detox program morning was noted to have confusion and tremulous.  Upon ED evaluation sodium was found to be 124, with AMS and tremor, patient was admitted with working diagnosis of possible alcohol withdrawal.  Subjective: Patient seen and examined, has no complaints today.  Tremors  have resolved.  Patient reports some anxiety.  Sodium significantly improved. See CIWA score today is 2.  Assessment & Plan: Acute metabolic encephalopathy due to hyponatremia and possible alcohol withdrawal Treat underlying causes, mental status back to baseline  Hyponatremia Likely due to to beer put ammonia Improving, continue gentle hydration Check BMP in a.m.  EtOH withdrawal Continue CIWA protocol Patient out of window of withdrawal at this point.  Can return to detox program for continued rehab  Anxiety Patient might benefit from Cymbalta, will start low-dose. Continue Inderal as needed  Hypertension BP stable Continue home meds  DVT prophylaxis: Lovenox Code Status: Full code Family Communication: None at bedside Disposition Plan: Back to detox program in a.m.  Consultants:   None  Procedures:   None  Antimicrobials:  None   Objective: Vitals:   03/19/18 2110 03/19/18 2214 03/20/18 0618 03/20/18 1000  BP: 139/78 (!) 169/81 115/70 126/69  Pulse: 97 100 88 89  Resp: 11 20 19 20   Temp:  98.3 F (36.8 C) 97.8 F (36.6 C) 97.8 F (36.6 C)  TempSrc:  Oral Oral Oral  SpO2: 99% 97% 96% 96%  Weight:      Height:        Intake/Output Summary (Last 24 hours) at 03/20/2018 1613 Last data filed at 03/20/2018 1542 Gross per 24 hour  Intake 740 ml  Output -  Net 740 ml     Filed Weights   03/19/18 0946  Weight: 66.7 kg (147 lb)    Examination:  General exam: Appears calm and comfortable  HEENT: AC/AT, PERRLA, OP moist and clear Respiratory system: Clear to auscultation. No wheezes,crackle or rhonchi Cardiovascular system: S1 & S2 heard, RRR. No JVD, murmurs, rubs or gallops Gastrointestinal system: Abdomen is nondistended, soft and nontender. No organomegaly or masses felt. Normal bowel sounds heard. Central nervous system: Alert and oriented. No focal neurological deficits. Extremities: No pedal edema. Symmetric, strength 5/5   Skin: No rashes, lesions or ulcers Psychiatry: Judgement and insight appear normal. Mood & affect appropriate.    Data Reviewed: I have personally reviewed following labs and imaging studies  CBC: Recent Labs  Lab 03/19/18 1139 03/20/18 0423  WBC 14.9* 9.3  NEUTROABS 12.6*  --   HGB 16.1 13.7  HCT 45.0 39.3  MCV 92.0 91.2  PLT 460* 756   Basic Metabolic Panel: Recent Labs  Lab 03/19/18 1139 03/20/18 0423  NA 124* 133*  K 3.8 2.9*  CL 88* 99*  CO2 25 25  GLUCOSE 127* 88  BUN 11 11  CREATININE 0.70 0.72  CALCIUM 9.4 8.6*  MG  --  1.9   GFR: Estimated Creatinine Clearance: 90.3 mL/min (by C-G formula based on SCr of 0.72 mg/dL). Liver Function Tests: Recent Labs  Lab 03/19/18 1139  AST 58*  ALT 74*  ALKPHOS 58  BILITOT 1.2  PROT 8.0  ALBUMIN 4.4   No results for input(s): LIPASE,  AMYLASE in the last 168 hours. No results for input(s): AMMONIA in the last 168 hours. Coagulation Profile: No results for input(s): INR, PROTIME in the last 168 hours. Cardiac Enzymes: No results for input(s): CKTOTAL, CKMB, CKMBINDEX, TROPONINI in the last 168 hours. BNP (last 3 results) No results for input(s): PROBNP in the last 8760 hours. HbA1C: No results for input(s): HGBA1C in the last 72 hours. CBG: No results for input(s): GLUCAP in the last 168 hours. Lipid Profile: No results for input(s): CHOL,  HDL, LDLCALC, TRIG, CHOLHDL, LDLDIRECT in the last 72 hours. Thyroid Function Tests: No results for input(s): TSH, T4TOTAL, FREET4, T3FREE, THYROIDAB in the last 72 hours. Anemia Panel: No results for input(s): VITAMINB12, FOLATE, FERRITIN, TIBC, IRON, RETICCTPCT in the last 72 hours. Sepsis Labs: No results for input(s): PROCALCITON, LATICACIDVEN in the last 168 hours.  No results found for this or any previous visit (from the past 240 hour(s)).    Radiology Studies: No results found.    Scheduled Meds: . amLODipine  10 mg Oral Daily  . cyanocobalamin  1,000 mcg Intramuscular Q30 days  . enoxaparin (LOVENOX) injection  40 mg Subcutaneous QHS  . imipramine  150 mg Oral QHS  . irbesartan  150 mg Oral Daily  . LORazepam  0-4 mg Oral Q6H   Followed by  . [START ON 03/21/2018] LORazepam  0-4 mg Oral Q12H  . multivitamin with minerals  1 tablet Oral Daily  . nicotine  21 mg Transdermal Daily  . predniSONE  40 mg Oral Q breakfast  . thiamine  100 mg Oral Daily   Continuous Infusions:   LOS: 1 day    Time spent: Total of 25 minutes spent with pt, greater than 50% of which was spent in discussion of  treatment, counseling and coordination of care   Chipper Oman, MD Pager: Text Page via [email or web address]   If 7PM-7AM, please contact night-coverage [email or web address] 03/20/2018, 4:13 PM   Note - This record has been created using [employer]. Chart creation errors have been sought, but may not always have been located. Such creation errors do not reflect on the standard of medical care.

## 2018-03-21 LAB — CBC WITH DIFFERENTIAL/PLATELET
BASOS ABS: 0 10*3/uL (ref 0.0–0.1)
BASOS PCT: 0 %
EOS ABS: 0 10*3/uL (ref 0.0–0.7)
Eosinophils Relative: 0 %
HCT: 40.2 % (ref 39.0–52.0)
HEMOGLOBIN: 13.9 g/dL (ref 13.0–17.0)
Lymphocytes Relative: 31 %
Lymphs Abs: 2.8 10*3/uL (ref 0.7–4.0)
MCH: 31.8 pg (ref 26.0–34.0)
MCHC: 34.6 g/dL (ref 30.0–36.0)
MCV: 92 fL (ref 78.0–100.0)
Monocytes Absolute: 0.9 10*3/uL (ref 0.1–1.0)
Monocytes Relative: 10 %
NEUTROS PCT: 59 %
Neutro Abs: 5.3 10*3/uL (ref 1.7–7.7)
Platelets: 368 10*3/uL (ref 150–400)
RBC: 4.37 MIL/uL (ref 4.22–5.81)
RDW: 12.9 % (ref 11.5–15.5)
WBC: 9 10*3/uL (ref 4.0–10.5)

## 2018-03-21 LAB — BASIC METABOLIC PANEL
Anion gap: 9 (ref 5–15)
BUN: 9 mg/dL (ref 6–20)
CALCIUM: 8.6 mg/dL — AB (ref 8.9–10.3)
CHLORIDE: 101 mmol/L (ref 101–111)
CO2: 25 mmol/L (ref 22–32)
CREATININE: 0.7 mg/dL (ref 0.61–1.24)
Glucose, Bld: 86 mg/dL (ref 65–99)
Potassium: 3.2 mmol/L — ABNORMAL LOW (ref 3.5–5.1)
SODIUM: 135 mmol/L (ref 135–145)

## 2018-03-21 MED ORDER — DULOXETINE HCL 20 MG PO CPEP: 20.0000 mg | ORAL_CAPSULE | Freq: Every day | ORAL | 0 refills | Status: DC

## 2018-03-21 MED ORDER — POTASSIUM CHLORIDE CRYS ER 20 MEQ PO TBCR
40.0000 meq | EXTENDED_RELEASE_TABLET | Freq: Once | ORAL | Status: AC
  Administered 2018-03-21: 40 meq via ORAL
  Filled 2018-03-21: qty 2

## 2018-03-21 MED ORDER — NICOTINE 21 MG/24HR TD PT24: 21.0000 mg | MEDICATED_PATCH | Freq: Every day | TRANSDERMAL | 0 refills | Status: DC

## 2018-03-21 MED ORDER — LORAZEPAM 0.5 MG PO TABS: 0.5000 mg | ORAL_TABLET | Freq: Two times a day (BID) | ORAL | 0 refills | Status: AC | PRN

## 2018-03-21 NOTE — Care Management Note (Signed)
Case Management Note  Patient Details  Name: Kallum Jorgensen MRN: [id number] Date of Birth: 22-Jun-1956  Subjective/Objective:  Alcohol withdrawal                  Action/Plan: Plan to discharge home   Expected Discharge Date:  03/21/18               Expected Discharge Plan:  Home/Self Care  In-House Referral:  Clinical Social Work  Discharge planning Services  CM Consult  Post Acute Care Choice:    Choice offered to:     DME Arranged:    DME Agency:     HH Arranged:    [place] Agency:     Status of Service:  Completed, signed off  If discussed at [employer], dates discussed:    Additional CommentsPurcell Mouton, RN 03/21/2018, 1:34 PM

## 2018-03-21 NOTE — Discharge Summary (Signed)
Physician Discharge Summary  Joshoa Shawler  QQP:[id number]  DOB: [birth year]  DOA: 03/19/2018 PCP: Janith Lima, MD  Admit date: 03/19/2018 Discharge date: 03/21/2018  Admitted From: [facility]  Disposition:  Home   Recommendations for Outpatient Follow-up:  1. Follow up with PCP in 1-2 weeks 2. Please obtain BMP/CBC in one week to monitor electrolytes and hemoglobin 3. Outpatient alcohol rehab recommended  Discharge Condition: Stable CODE STATUS: Full code Diet recommendation: Heart Healthy   Brief/Interim Summary: For full details see H&P/Progress note, but in brief, Roman Dubuc is a  62 year old male with medical history of alcohol abuse.  Patient presented to the emergency department with confusion.  Patient has been in detox program however it was noted to have confusion and tremors.  Upon ED evaluation sodium was found to be 124, with AMS and tremor, patient was admitted with working diagnosis of possible alcohol withdrawal and hyponatremia.   Subjective: Patient seen and examined, has no complaints.  Mentally back to baseline.  No tremors.  Remains afebrile.  Only required 2 mg of Ativan over the past 24 hours.  Discharge Diagnoses/Hospital Course:  Acute metabolic encephalopathy due to hyponatremia and possible alcohol withdrawal Resolved, treating underlying causes   Hyponatremia - resolved  Felt to be due to beer potomania Improved, treated IV hydration, encourage oral hydration  Alcohol cessation discussed  Check renal function in 1 week   EtOH withdrawal - resolved Patient out of window of withdrawal at this point. Last drink 1 week ago.  Patient declines to go to inpatient detox program, would like to do outpatient rehab for now. Will discharge on low-dose Ativan for the next 3 days tapering off  Anxiety Will start Cymbalta. Patient also on Ativan, will taper off over the next 3 days  Continue Inderal as needed. Follow up with PCP.    Hypertension BP stable Continue home meds with no changes    Hypokalemia  Repleted  Check BMP in 1 week   All other chronic medical condition were stable during the hospitalization.  On the day of the discharge the patient's vitals were stable, and no other acute medical condition were reported by patient. the patient was felt safe to be discharge to home.   Discharge Instructions  You were cared for by a hospitalist during your hospital stay. If you have any questions about your discharge medications or the care you received while you were in the hospital after you are discharged, you can call the unit and asked to speak with the hospitalist on call if the hospitalist that took care of you is not available. Once you are discharged, your primary care physician will handle any further medical issues. Please note that NO REFILLS for any discharge medications will be authorized once you are discharged, as it is imperative that you return to your primary care physician (or establish a relationship with a primary care physician if you do not have one) for your aftercare needs so that they can reassess your need for medications and monitor your lab values.  Discharge Instructions    Call MD for:  difficulty breathing, headache or visual disturbances   Complete by:  As directed    Call MD for:  extreme fatigue   Complete by:  As directed    Call MD for:  hives   Complete by:  As directed    Call MD for:  persistant dizziness or light-headedness   Complete by:  As directed    Call  MD for:  persistant nausea and vomiting   Complete by:  As directed    Call MD for:  redness, tenderness, or signs of infection (pain, swelling, redness, odor or green/yellow discharge around incision site)   Complete by:  As directed    Call MD for:  severe uncontrolled pain   Complete by:  As directed    Call MD for:  temperature >100.4   Complete by:  As directed    Diet - low sodium heart healthy   Complete  by:  As directed    Increase activity slowly   Complete by:  As directed      Allergies as of 03/21/2018      Reactions   Penicillins    Has patient had a PCN reaction causing immediate rash, facial/tongue/throat swelling, SOB or lightheadedness with hypotension: Yes Has patient had a PCN reaction causing severe rash involving mucus membranes or skin necrosis: No Has patient had a PCN reaction that required hospitalization: Unknown Has patient had a PCN reaction occurring within the last 10 years: Unknown If all of the above answers are "NO", then may proceed with Cephalosporin use.      Medication List    STOP taking these medications   predniSONE 20 MG tablet Commonly known as:  DELTASONE     TAKE these medications   albuterol 108 (90 Base) MCG/ACT inhaler Commonly known as:  PROVENTIL HFA;VENTOLIN HFA Inhale 2 puffs into the lungs every 6 (six) hours as needed for wheezing or shortness of breath.   amLODipine 10 MG tablet Commonly known as:  NORVASC Take 1 tablet (10 mg total) by mouth daily.   DULoxetine 20 MG capsule Commonly known as:  CYMBALTA Take 1 capsule (20 mg total) by mouth daily.   imipramine 50 MG tablet Commonly known as:  TOFRANIL Take 150 mg by mouth at bedtime.   LORazepam 0.5 MG tablet Commonly known as:  ATIVAN Take 1 tablet (0.5 mg total) by mouth every 12 (twelve) hours as needed for up to 3 days for anxiety.   MULTIVITAMIN ADULT PO Take 1 tablet by mouth daily.   nicotine 21 mg/24hr patch Commonly known as:  NICODERM CQ - dosed in mg/24 hours Place 1 patch (21 mg total) onto the skin daily.   propranolol 10 MG tablet Commonly known as:  INDERAL Take 10 mg by mouth 3 (three) times daily as needed (anxiety).   thiamine 100 MG tablet Take 1 tablet (100 mg total) by mouth daily.   valsartan 160 MG tablet Commonly known as:  DIOVAN Take 1 tablet (160 mg total) by mouth daily.      Follow-up Information    Janith Lima, MD.  Schedule an appointment as soon as possible for a visit in 1 week(s).   Specialty:  Internal Medicine Why:  Hospital follow up  Contact information: [address] [place] [address] [phone]          Allergies  Allergen Reactions  . Penicillins     Has patient had a PCN reaction causing immediate rash, facial/tongue/throat swelling, SOB or lightheadedness with hypotension: Yes Has patient had a PCN reaction causing severe rash involving mucus membranes or skin necrosis: No Has patient had a PCN reaction that required hospitalization: Unknown Has patient had a PCN reaction occurring within the last 10 years: Unknown If all of the above answers are "NO", then may proceed with Cephalosporin use.     Consultations:  None  Procedures/Studies: Dg Chest 2 View  Result Date: 03/02/2018 CLINICAL DATA:  Anxiety and ethanol abuse EXAM: CHEST - 2 VIEW COMPARISON:  April 20, 2017 FINDINGS: Lungs appear somewhat hyperexpanded. There are scattered areas of slight scarring in mid lung regions. There is no edema or consolidation. The heart size and pulmonary vascularity normal. No adenopathy. There is aortic atherosclerosis. No evident bone lesions. IMPRESSION: Lungs somewhat hyperexpanded with areas of mild scarring. No edema or consolidation. No adenopathy evident. There is aortic atherosclerosis. Aortic Atherosclerosis (ICD10-I70.0). Electronically Signed   By: Lowella Grip III M.D.   On: 03/02/2018 18:12   US Abdomen Limited Ruq  Result Date: 03/02/2018 CLINICAL DATA:  Elevated liver enzymes with chronic ethanol abuse EXAM: ULTRASOUND ABDOMEN LIMITED RIGHT UPPER QUADRANT COMPARISON:  None. FINDINGS: Gallbladder: No gallstones or wall thickening visualized. There is no pericholecystic fluid. No sonographic Murphy sign noted by sonographer. Common bile duct: Diameter: 4 mm. No intrahepatic or extrahepatic biliary duct dilatation. Liver: No focal lesion identified.  Liver echogenicity overall is increased. Portal vein is patent on color Doppler imaging with normal direction of blood flow towards the liver. IMPRESSION: Generalized increase in liver echogenicity, finding indicative of hepatic steatosis. Underlying parenchymal liver disease cannot excluded. While no focal liver lesions are evident on this study, it must be cautioned that the sensitivity of ultrasound for detection of focal liver lesions is diminished in this circumstance. Study otherwise unremarkable. Electronically Signed   By: Lowella Grip III M.D.   On: 03/02/2018 18:54    Discharge Exam: Vitals:   03/20/18 2116 03/21/18 0450  BP: (!) 142/75 (!) 157/82  Pulse: 85 91  Resp: 18 18  Temp: 98.9 F (37.2 C) 98.2 F (36.8 C)  SpO2: 96% 99%   Vitals:   03/20/18 1000 03/20/18 1815 03/20/18 2116 03/21/18 0450  BP: 126/69 134/70 (!) 142/75 (!) 157/82  Pulse: 89 84 85 91  Resp: 20 15 18 18   Temp: 97.8 F (36.6 C) 97.8 F (36.6 C) 98.9 F (37.2 C) 98.2 F (36.8 C)  TempSrc: Oral Oral Oral Oral  SpO2: 96% 100% 96% 99%  Weight:      Height:        General: Pt is alert, awake, not in acute distress Cardiovascular: RRR, S1/S2 +, no rubs, no gallops Respiratory: CTA bilaterally, no wheezing, no rhonchi Abdominal: Soft, NT, ND, bowel sounds + Extremities: no edema, no cyanosis  The results of significant diagnostics from this hospitalization (including imaging, microbiology, ancillary and laboratory) are listed below for reference.     Microbiology: No results found for this or any previous visit (from the past 240 hour(s)).   Labs: BNP (last 3 results) No results for input(s): BNP in the last 8760 hours. Basic Metabolic Panel: Recent Labs  Lab 03/19/18 1139 03/20/18 0423 03/21/18 0443  NA 124* 133* 135  K 3.8 2.9* 3.2*  CL 88* 99* 101  CO2 25 25 25   GLUCOSE 127* 88 86  BUN 11 11 9   CREATININE 0.70 0.72 0.70  CALCIUM 9.4 8.6* 8.6*  MG  --  1.9  --    Liver  Function Tests: Recent Labs  Lab 03/19/18 1139  AST 58*  ALT 74*  ALKPHOS 58  BILITOT 1.2  PROT 8.0  ALBUMIN 4.4   No results for input(s): LIPASE, AMYLASE in the last 168 hours. No results for input(s): AMMONIA in the last 168 hours. CBC: Recent Labs  Lab 03/19/18 1139 03/20/18 0423 03/21/18 0443  WBC 14.9* 9.3 9.0  NEUTROABS 12.6*  --  5.3  HGB 16.1 13.7 13.9  HCT 45.0 39.3 40.2  MCV 92.0 91.2 92.0  PLT 460* 359 368   Cardiac Enzymes: No results for input(s): CKTOTAL, CKMB, CKMBINDEX, TROPONINI in the last 168 hours. BNP: Invalid input(s): POCBNP CBG: No results for input(s): GLUCAP in the last 168 hours. D-Dimer No results for input(s): DDIMER in the last 72 hours. Hgb A1c No results for input(s): HGBA1C in the last 72 hours. Lipid Profile No results for input(s): CHOL, HDL, LDLCALC, TRIG, CHOLHDL, LDLDIRECT in the last 72 hours. Thyroid function studies No results for input(s): TSH, T4TOTAL, T3FREE, THYROIDAB in the last 72 hours.  Invalid input(s): FREET3 Anemia work up No results for input(s): VITAMINB12, FOLATE, FERRITIN, TIBC, IRON, RETICCTPCT in the last 72 hours. Urinalysis    Component Value Date/Time   COLORURINE YELLOW 03/19/2018 1515   APPEARANCEUR CLEAR 03/19/2018 1515   LABSPEC 1.010 03/19/2018 1515   PHURINE 8.0 03/19/2018 1515   GLUCOSEU NEGATIVE 03/19/2018 1515   GLUCOSEU NEGATIVE 05/24/2016 1036   HGBUR NEGATIVE 03/19/2018 1515   BILIRUBINUR NEGATIVE 03/19/2018 1515   KETONESUR 5 (A) 03/19/2018 1515   PROTEINUR NEGATIVE 03/19/2018 1515   UROBILINOGEN 0.2 05/24/2016 1036   NITRITE NEGATIVE 03/19/2018 1515   LEUKOCYTESUR NEGATIVE 03/19/2018 1515   Sepsis Labs Invalid input(s): PROCALCITONIN,  WBC,  LACTICIDVEN Microbiology No results found for this or any previous visit (from the past 240 hour(s)).   Time coordinating discharge: 32 minutes  SIGNED:  Chipper Oman, MD  [facility] 03/21/2018, 12:22 PM  Pager please text  page via  [email or web address]  Note - This record has been created using [employer]. Chart creation errors have been sought, but may not always have been located. Such creation errors do not reflect on the standard of medical care.

## 2018-03-21 NOTE — Clinical Social Work Note (Signed)
Clinical Social Work Assessment  Patient Details  Name: Oscar Phillips MRN: [id number] Date of Birth: [birth year]  Date of referral:  03/21/18               Reason for consult:  Substance Use/ETOH Abuse                Permission sought to share information with:  Chartered certified accountant granted to share information::  Yes, Verbal Permission Granted  Name::        Agency::  [facility]  Relationship::     Contact Information:     Housing/Transportation Living arrangements for the past 2 months:  Apartment Source of Information:  Patient, Facility, Medical Team Patient Interpreter Needed:  None Criminal Activity/Legal Involvement Pertinent to Current Situation/Hospitalization:  No - Comment as needed Significant Relationships:  Friend, Siblings Lives with:  Self, Roommate Do you feel safe going back to the place where you live?  Yes Need for family participation in patient care:  No (Coment)  Care giving concerns:   Pt admitted from [facility] where he reportedly had entered for detox and alcohol use treatment 5 days earlier. Upon arrival to ED was confused and admitted due to possible alcohol withdrawal-related delirium.  Confusion has since resolved. Stable for DC today.    Social Worker assessment / plan:  CSW consulted to assist with disposition as pt was admitted from substance use treatment center.  Pt was evaluated by TTS in ED prior to inpatient admission. CSW reviewed that assessment and notes in chart prior to meeting with pt. At time of assessment he was alert and oriented x 4. Sitting on bench in hospital room dressed in street clothes. Demonstrated good insight, speech and affect were full and appropriate. Pt explains that he has alcohol and xanax use history and decided to enter treatment program since he has never received formal treatment before. Stated he also has dx of anxiety and sees a therapist "on [address], he has a  Biomedical engineer. He recommended I go through detox so that's why I looked into [facility]."  Pt lives with a roommate and has brother and sister-in-law in [place].  Provided permission for CSW to coordinate with [facility]. CSW provided pt's clinical information to facility and was informed facility feels pt's clinical needs are too great to be able to accept him back to continue treatment. Stated that they would recommend crisis psychiatric admission. Also reported that pt may need more supervision than they can provide for ambulating and ADLs. CSW discussed with treatment team- no acute psychiatric needs identified during pt's hospitalization. Pt steady and no ambulation assistance needs identified per staff's observation. Pt reports he would prefer to return home rather than return to [facility] or look into another residential treatment program. States he would like to attend [place] meetings, continue with his therapist, and was open to intensive outpatient substance abuse treatment program (CSW discussed options with him and provided information for [facility] program- pt reported he would rather call to make appointment on his own than have CSW schedule for him). Pt reports his car is at [employer] and he called his brother to pick him up from hospital. Declined to have CSW speak with family.   Plan: Pt discharged with plan to return to his home (with roommate) and follow up with outpatient therapy.     Employment status:  Retired Forensic scientist:  Managed Care(BCBS) PT Recommendations:  Not assessed  at this time Information / Referral to community resources:  Outpatient Substance Abuse Treatment Options, Outpatient Psychiatric Care (Comment Required)([facility])  Patient/Family's Response to care:  Pt was appreciative  Patient/Family's Understanding of and Emotional Response to Diagnosis, Current Treatment, and Prognosis:  Pt  showed good understanding of his treatment received. At times got details confused ("I have been here 2 days... No three days'') but seemed to process conversation effectively ("I went in thinking I could stay for treatment after detox. Now that I've been through several days of detox I think I need to be at home. I know I still need support to quit drinking but in the end it is up to me. I need a support group through AA")  Emotional Assessment Appearance:  Appears stated age Attitude/Demeanor/Rapport:  Engaged Affect (typically observed):  Accepting, Calm, Adaptable, Pleasant Orientation:  Oriented to Self, Oriented to Place, Oriented to  Time, Oriented to Situation Alcohol / Substance use:  Alcohol Use, Other(hx of benzodiazepine use) Psych involvement (Current and /or in the community):  Outpatient Provider(states he has a therapist)  Discharge Needs  Concerns to be addressed:  Substance Abuse Concerns Readmission within the last 30 days:  No Current discharge risk:  None Barriers to Discharge:  No Barriers Identified   Nila Nephew, LCSW 03/21/2018, 2:09 PM  [phone]

## 2018-03-21 NOTE — Discharge Instructions (Signed)
[place] meetings for [facility] website (has list of locations and times of meetings): [email or web address]  Intensive Outpatient Program - [facility] [address] [place], [place], [place] [address] [phone], [phone], [phone]

## 2018-03-22 DIAGNOSIS — F10239 Alcohol dependence with withdrawal, unspecified: Secondary | ICD-10-CM | POA: Diagnosis not present

## 2018-03-22 DIAGNOSIS — F13231 Sedative, hypnotic or anxiolytic dependence with withdrawal delirium: Secondary | ICD-10-CM | POA: Diagnosis not present

## 2018-03-22 DIAGNOSIS — F29 Unspecified psychosis not due to a substance or known physiological condition: Secondary | ICD-10-CM | POA: Diagnosis not present

## 2018-03-23 DIAGNOSIS — F29 Unspecified psychosis not due to a substance or known physiological condition: Secondary | ICD-10-CM | POA: Diagnosis not present

## 2018-03-24 DIAGNOSIS — F29 Unspecified psychosis not due to a substance or known physiological condition: Secondary | ICD-10-CM | POA: Diagnosis not present

## 2018-03-25 DIAGNOSIS — F29 Unspecified psychosis not due to a substance or known physiological condition: Secondary | ICD-10-CM | POA: Diagnosis not present

## 2018-03-26 DIAGNOSIS — F29 Unspecified psychosis not due to a substance or known physiological condition: Secondary | ICD-10-CM | POA: Diagnosis not present

## 2018-03-27 DIAGNOSIS — F29 Unspecified psychosis not due to a substance or known physiological condition: Secondary | ICD-10-CM | POA: Diagnosis not present

## 2018-03-28 ENCOUNTER — Telehealth: Payer: Self-pay | Admitting: Internal Medicine

## 2018-03-28 NOTE — Telephone Encounter (Signed)
Oscar Phillips, would you be willing to see patient and review medications?

## 2018-03-28 NOTE — Telephone Encounter (Signed)
Copied from [place] [phone]. Topic: Quick Communication - See Telephone Encounter >> Mar 28, 2018  2:40 PM Vernona Rieger wrote: CRM for notification. See Telephone encounter for: 03/28/18.  Oscar Phillips ( brother ) called and said that his brother Oscar Phillips ) was just discharged from detox yesterday 6/19. He wants to know if another provider could review his medication this week because Dr Ronnald Ramp does not have anything for the next three weeks. He said that he just wants to make sure he has this done before he returns him to his home in [place]. He is in [place] at his brothers ( robert's ). He had a detox from his xanax/alcohol.   Call back to [place] @ [phone]

## 2018-03-29 NOTE — Telephone Encounter (Signed)
Yes that's fine 

## 2018-03-29 NOTE — Telephone Encounter (Signed)
appt made

## 2018-04-01 ENCOUNTER — Encounter (HOSPITAL_COMMUNITY): Payer: Self-pay | Admitting: *Deleted

## 2018-04-01 ENCOUNTER — Encounter: Payer: Self-pay | Admitting: Family

## 2018-04-01 ENCOUNTER — Inpatient Hospital Stay (HOSPITAL_COMMUNITY)
Admission: RE | Admit: 2018-04-01 | Discharge: 2018-04-04 | DRG: 885 | Disposition: A | Discharge status: Home / Self Care | Payer: BLUE CROSS/BLUE SHIELD | Attending: Psychiatry | Admitting: Psychiatry

## 2018-04-01 ENCOUNTER — Ambulatory Visit: Payer: BLUE CROSS/BLUE SHIELD | Admitting: Family

## 2018-04-01 ENCOUNTER — Other Ambulatory Visit: Payer: Self-pay

## 2018-04-01 VITALS — BP 136/60 | HR 102 | Temp 97.7°F | Ht 68.0 in | Wt 145.0 lb

## 2018-04-01 DIAGNOSIS — I10 Essential (primary) hypertension: Secondary | ICD-10-CM | POA: Diagnosis not present

## 2018-04-01 DIAGNOSIS — E785 Hyperlipidemia, unspecified: Secondary | ICD-10-CM | POA: Diagnosis present

## 2018-04-01 DIAGNOSIS — F329 Major depressive disorder, single episode, unspecified: Secondary | ICD-10-CM | POA: Diagnosis not present

## 2018-04-01 DIAGNOSIS — F17210 Nicotine dependence, cigarettes, uncomplicated: Secondary | ICD-10-CM | POA: Diagnosis not present

## 2018-04-01 DIAGNOSIS — J449 Chronic obstructive pulmonary disease, unspecified: Secondary | ICD-10-CM | POA: Diagnosis present

## 2018-04-01 DIAGNOSIS — F1310 Sedative, hypnotic or anxiolytic abuse, uncomplicated: Secondary | ICD-10-CM | POA: Diagnosis present

## 2018-04-01 DIAGNOSIS — F411 Generalized anxiety disorder: Secondary | ICD-10-CM | POA: Diagnosis not present

## 2018-04-01 DIAGNOSIS — G4700 Insomnia, unspecified: Secondary | ICD-10-CM | POA: Diagnosis present

## 2018-04-01 DIAGNOSIS — F3010 Manic episode without psychotic symptoms, unspecified: Secondary | ICD-10-CM | POA: Diagnosis not present

## 2018-04-01 DIAGNOSIS — R634 Abnormal weight loss: Secondary | ICD-10-CM | POA: Diagnosis not present

## 2018-04-01 DIAGNOSIS — F429 Obsessive-compulsive disorder, unspecified: Secondary | ICD-10-CM | POA: Diagnosis present

## 2018-04-01 DIAGNOSIS — F332 Major depressive disorder, recurrent severe without psychotic features: Secondary | ICD-10-CM | POA: Diagnosis not present

## 2018-04-01 DIAGNOSIS — F1010 Alcohol abuse, uncomplicated: Secondary | ICD-10-CM | POA: Diagnosis not present

## 2018-04-01 DIAGNOSIS — Z818 Family history of other mental and behavioral disorders: Secondary | ICD-10-CM | POA: Diagnosis not present

## 2018-04-01 DIAGNOSIS — Z79899 Other long term (current) drug therapy: Secondary | ICD-10-CM

## 2018-04-01 DIAGNOSIS — R252 Cramp and spasm: Secondary | ICD-10-CM

## 2018-04-01 DIAGNOSIS — R450 Nervousness: Secondary | ICD-10-CM | POA: Diagnosis not present

## 2018-04-01 DIAGNOSIS — Z811 Family history of alcohol abuse and dependence: Secondary | ICD-10-CM | POA: Diagnosis not present

## 2018-04-01 MED ORDER — GABAPENTIN 100 MG PO CAPS
100.0000 mg | ORAL_CAPSULE | Freq: Three times a day (TID) | ORAL | Status: DC
  Administered 2018-04-01: 100 mg via ORAL
  Filled 2018-04-01 (×6): qty 1

## 2018-04-01 MED ORDER — IRBESARTAN 150 MG PO TABS
150.0000 mg | ORAL_TABLET | Freq: Every day | ORAL | Status: DC
  Administered 2018-04-02 – 2018-04-04 (×3): 150 mg via ORAL
  Filled 2018-04-01 (×7): qty 1

## 2018-04-01 MED ORDER — ENSURE ENLIVE PO LIQD
237.0000 mL | Freq: Two times a day (BID) | ORAL | Status: DC
  Administered 2018-04-02 – 2018-04-03 (×2): 237 mL via ORAL

## 2018-04-01 MED ORDER — HYDROXYZINE HCL 25 MG PO TABS: 25.0000 mg | ORAL_TABLET | Freq: Four times a day (QID) | ORAL | Status: DC | PRN

## 2018-04-01 MED ORDER — MAGNESIUM HYDROXIDE 400 MG/5ML PO SUSP: 30.0000 mL | Freq: Every day | ORAL | Status: DC | PRN

## 2018-04-01 MED ORDER — FOLIC ACID 1 MG PO TABS
1.0000 mg | ORAL_TABLET | Freq: Every day | ORAL | Status: DC
  Administered 2018-04-02 – 2018-04-04 (×3): 1 mg via ORAL
  Filled 2018-04-01 (×5): qty 1

## 2018-04-01 MED ORDER — ACETAMINOPHEN 325 MG PO TABS: 650.0000 mg | ORAL_TABLET | Freq: Four times a day (QID) | ORAL | Status: DC | PRN

## 2018-04-01 MED ORDER — AMLODIPINE BESYLATE 10 MG PO TABS
10.0000 mg | ORAL_TABLET | Freq: Every day | ORAL | Status: DC
  Administered 2018-04-02 – 2018-04-04 (×3): 10 mg via ORAL
  Filled 2018-04-01 (×5): qty 1

## 2018-04-01 MED ORDER — ALUM & MAG HYDROXIDE-SIMETH 200-200-20 MG/5ML PO SUSP: 30.0000 mL | ORAL | Status: DC | PRN

## 2018-04-01 MED ORDER — VITAMIN B-1 100 MG PO TABS
100.0000 mg | ORAL_TABLET | Freq: Every day | ORAL | Status: DC
  Administered 2018-04-02 – 2018-04-04 (×3): 100 mg via ORAL
  Filled 2018-04-01 (×5): qty 1

## 2018-04-01 NOTE — Progress Notes (Signed)
Oscar Phillips is a 62 year old male pt admitted on voluntary basis after presenting as a walk-in. He reports that he went to [facility] last week and got discharged this past Wednesday and feels his medications are not working. He reports anxiety, inability to sleep however denies any SI and is able to contract for safety while in the hospital. He spoke about how he was on xanax and abusing xanax for the past 35 years and expressed desire to get off the xanax. He reports that Dr. Toy Care manages him on an outpatient basis and prescribes medications for him. He reports that he lives alone and will go back there after discharge. [name] was oriented to the unit and safety maintained.
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
## 2018-04-01 NOTE — Tx Team (Signed)
Initial Treatment Plan 04/01/2018 6:24 PM Oscar Phillips UEA:[id number]    PATIENT STRESSORS: Medication change or noncompliance Substance abuse   PATIENT STRENGTHS: Ability for insight Average or above average intelligence Capable of independent living General fund of knowledge Motivation for treatment/growth   PATIENT IDENTIFIED PROBLEMS: Depression Anxiety Suicidal thoughts "my medications aren't working"                     DISCHARGE CRITERIA:  Ability to meet basic life and health needs Improved stabilization in mood, thinking, and/or behavior Verbal commitment to aftercare and medication compliance  PRELIMINARY DISCHARGE PLAN: Attend aftercare/continuing care group Return to previous living arrangement  PATIENT/FAMILY INVOLVEMENT: This treatment plan has been presented to and reviewed with the patient, Oscar Phillips, and/or family member, .  The patient and family have been given the opportunity to ask questions and make suggestions.  Ermalee Mealy, [place], [place] 04/01/2018, 6:24 PM

## 2018-04-01 NOTE — Progress Notes (Signed)
Oscar Phillips is a 62 y.o. male with the following history as recorded in EpicCare:  Patient Active Problem List   Diagnosis Date Noted  . Alcohol withdrawal, with perceptual disturbance ([place]) 03/19/2018  . Chronic alcohol abuse 03/02/2018  . Hyponatremia 03/02/2018  . Elevated liver enzymes 03/02/2018  . Alcohol abuse with intoxication delirium ([place]) 03/02/2018  . OCD (obsessive compulsive disorder) 04/22/2017  . COPD exacerbation ([place]) 04/20/2017  . Colon cancer screening 05/24/2016  . Abdominal bruit 05/24/2016  . Trichomonal urethritis in male 05/24/2016  . PSA elevation 08/23/2015  . Cough 05/20/2015  . Hyperglycemia 05/20/2015  . Routine general medical examination at a health care facility 05/20/2015  . Essential hypertension 05/20/2015  . 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 exacerbation ([place]) 05/20/2015  . Nonspecific abnormal electrocardiogram (ECG) (EKG) 05/20/2015  . Hyperlipidemia with target LDL less than 100 05/20/2015  . B12 deficiency 09/21/2010  . Irritable bowel syndrome 09/20/2010  . Anxiety state 06/03/2010  . TOBACCO USE 06/03/2010  . DEPRESSION 06/03/2010  . GERD 06/03/2010    No current facility-administered medications for this visit.    No current outpatient medications on file.    Allergies: Penicillins  Past Medical History:  Diagnosis Date  . Abdominal bruit   . Alcoholism ([place])   . Anxiety   . B12 deficiency   . Broken arm    left arm/has metal splint/06/16/15  . Bronchitis   . COPD (chronic obstructive pulmonary disease) ([place])   . GERD (gastroesophageal reflux disease)   . H/O trichomonal urethritis   . History of panic attacks   . History of rectal bleeding    in August 2016  . HLD (hyperlipidemia)   . HTN (hypertension)   . Myocardial infarction ([place])    Pt unsure/no symptoms in 05/2015  . OCD (obsessive compulsive disorder)   . PSA elevation     Past Surgical History:  Procedure Laterality Date  . ELBOW FRACTURE SURGERY  Left   . LUMBAR DISC SURGERY    . TONSILLECTOMY  1965    Family History  Problem Relation Age of Onset  . Hyperlipidemia Father   . Colon cancer Father   . Stomach cancer Father   . Dementia Mother   . Alcohol abuse Maternal Uncle   . Lung cancer Maternal Uncle   . Heart failure Paternal Grandmother   . Emphysema Paternal Grandmother   . Diabetes Neg Hx   . Drug abuse Neg Hx   . Early death Neg Hx   . Heart disease Neg Hx   . Hypertension Neg Hx   . Kidney disease Neg Hx   . Stroke Neg Hx     Social History   Tobacco Use  . Smoking status: Current Every Day Smoker    Packs/day: 1.00    Years: 25.00    Pack years: 25.00    Types: Cigarettes  . Smokeless tobacco: Never Used  Substance Use Topics  . Alcohol use: Yes    Alcohol/week: 9.0 oz    Types: 15 Glasses of wine per week    Subjective:  Patient is accompanied by his brother today to review medications from recent hospitalization for alcohol abuse. He was at [facility] here in early June and was admitted to [facility] with hyponatremia; from [facility], he was then admitted to inpatient alcohol treatment facility in [place]; was recently discharged from that facility and wanted to go over medications; notes that before going into facility, was only taking Xanax ( has been off  this since early June); was discharged from hospital on 2 different medications for blood pressure, Gabapentin, Cymbalta, Trazodone and Thiamine;  Patient has numerous complaints including insomnia, tremors, pressured thought, increased perspiration; denies any prior history of bipolar disorder/ mania but does admit to documented neurosis/ OCD;    Objective:  Vitals:   04/01/18 1353  BP: 136/60  Pulse: (!) 102  Temp: 97.7 F (36.5 C)  TempSrc: Oral  SpO2: 97%  Weight: 145 lb 0.6 oz (65.8 kg)  Height: _0  (1.727 m)    General: Well developed, well nourished, in no acute distress  Skin : Warm and dry.  Head: Normocephalic and  atraumatic  Lungs: Respirations unlabored; Neurologic: Alert and oriented; speech intact; face symmetrical; moves all extremities well; CNII-XII intact without focal deficit   Assessment:  1. Manic behavior ([place])   2. Muscle cramps     Plan:  During course of office visit, it appears that patient is exhibiting manic behavior; not sure if related to recent start of Cymbalta or Gabapentin or if his alcohol abuse was covering an underlying bipolar disorder; recommend that he go directly to [facility] for evaluation for admission and he agrees; brother agrees to take him to the hospital.  Spent 30 minutes with patient; greater than 50% spent in counseling;   No follow-ups on file.  Orders Placed This Encounter  Procedures  . Comp Met (CMET)    Standing Status:   Future    Standing Expiration Date:   04/01/2019  . B12    Standing Status:   Future    Standing Expiration Date:   04/01/2019  . Magnesium    Standing Status:   Future    Standing Expiration Date:   04/01/2019  . Vitamin B1    Standing Status:   Future    Standing Expiration Date:   04/02/2019  . Ambulatory referral to Psychiatry    Referral Priority:   Routine    Referral Type:   Psychiatric    Referral Reason:   Specialty Services Required    Requested Specialty:   Psychiatry    Number of Visits Requested:   1    Requested Prescriptions    No prescriptions requested or ordered in this encounter

## 2018-04-01 NOTE — BHH Group Notes (Signed)
Adult Psychoeducational Group Note  Date:  04/01/2018 Time:  10:09 PM  Group Topic/Focus:  Wrap-Up Group:   The focus of this group is to help patients review their daily goal of treatment and discuss progress on daily workbooks.  Participation Level:  Active  Participation Quality:  Appropriate and Attentive  Affect:  Appropriate  Cognitive:  Alert and Appropriate  Insight: Appropriate and Good  Engagement in Group:  Engaged  Modes of Intervention:  Discussion and Education  Additional Comments:  Pt attended and participated in wrap up group this evening. Pt had an "interesting" day in which it started well, but they have a chemical imbalance or withdrawals. Pt goal while they are here is to get their meds regulated.   Cristi Loron 04/01/2018, 10:09 PM

## 2018-04-01 NOTE — H&P (Signed)
Behavioral Health Medical Screening Exam  Oscar Phillips is an 62 y.o. male who presents as a walk in due to multiple symptoms such as "insomnia, sweats, and racing thoughts." Patient and brother report that [name] was recently discharged from [facility]. He was also detoxed from alcohol/xanax at [facility] earlier in June 2019. Patient seems to think he has been taking Cymbalta for the last ten days. Denies any previous history of Bipolar Disorder but rather OCD/Panic Disorder. Patient reports "I don't feel like myself. I'm having trouble functioning. I feel very anxious. I don't want to come back to the hospital but I feel that I have no choice." Patient meets criteria for inpatient psychiatric admission due to decreasing ability to care for self and function normally.   Total Time spent with patient: 20 minutes  Psychiatric Specialty Exam: Physical Exam  Constitutional: He appears well-developed and well-nourished.  HENT:  Head: Normocephalic.  Neck: Normal range of motion.  Cardiovascular: Normal rate, regular rhythm, normal heart sounds and intact distal pulses.  Respiratory: Effort normal and breath sounds normal.  GI: Soft. Bowel sounds are normal.  Musculoskeletal: Normal range of motion.  Neurological: He is alert.  Skin: Skin is warm.  Patient has some bruises at various stages of healing on his fore-arms.     ROS  Blood pressure 140/66, pulse 90, temperature 98.4 F (36.9 C), SpO2 97 %.There is no height or weight on file to calculate BMI.  General Appearance: Casual  Eye Contact:  Good  Speech:  Clear and Coherent  Volume:  Normal  Mood:  Anxious  Affect:  Depressed  Thought Process:  Coherent and Goal Directed  Orientation:  Full (Time, Place, and Person)  Thought Content:  Concerns about increased anxiety, possible side effects to Cymbalta  Suicidal Thoughts:  No  Homicidal Thoughts:  No  Memory:  Immediate;   Fair Recent;   Good Remote;   Fair   Judgement:  Fair  Insight:  Present  Psychomotor Activity:  Restlessness  Concentration: Concentration: Fair and Attention Span: Fair  Recall:  Good  Fund of Knowledge:Good  Language: Good  Akathisia:  No  Handed:  Right  AIMS (if indicated):     Assets:  Communication Skills Desire for Improvement Financial Resources/Insurance Housing Intimacy Leisure Time [place] Talents/Skills Transportation  Sleep:       Musculoskeletal: Strength & Muscle Tone: within normal limits Gait & Station: normal Patient leans: N/A  Blood pressure 140/66, pulse 90, temperature 98.4 F (36.9 C), SpO2 97 %.  Recommendations:  Based on my evaluation the patient does not appear to have an emergency medical condition.  Elmarie Shiley, NP 04/01/2018, 5:01 PM

## 2018-04-02 DIAGNOSIS — Z811 Family history of alcohol abuse and dependence: Secondary | ICD-10-CM

## 2018-04-02 DIAGNOSIS — F411 Generalized anxiety disorder: Secondary | ICD-10-CM

## 2018-04-02 DIAGNOSIS — R634 Abnormal weight loss: Secondary | ICD-10-CM

## 2018-04-02 DIAGNOSIS — F17210 Nicotine dependence, cigarettes, uncomplicated: Secondary | ICD-10-CM

## 2018-04-02 DIAGNOSIS — G4700 Insomnia, unspecified: Secondary | ICD-10-CM

## 2018-04-02 DIAGNOSIS — Z818 Family history of other mental and behavioral disorders: Secondary | ICD-10-CM

## 2018-04-02 DIAGNOSIS — R450 Nervousness: Secondary | ICD-10-CM

## 2018-04-02 LAB — RAPID URINE DRUG SCREEN, HOSP PERFORMED
AMPHETAMINES: NOT DETECTED
BENZODIAZEPINES: NOT DETECTED
COCAINE: NOT DETECTED
OPIATES: NOT DETECTED
Tetrahydrocannabinol: NOT DETECTED

## 2018-04-02 LAB — URINALYSIS, COMPLETE (UACMP) WITH MICROSCOPIC
BACTERIA UA: NONE SEEN
Bilirubin Urine: NEGATIVE
Glucose, UA: NEGATIVE mg/dL
Hgb urine dipstick: NEGATIVE
KETONES UR: NEGATIVE mg/dL
Leukocytes, UA: NEGATIVE
Nitrite: NEGATIVE
Protein, ur: 30 mg/dL — AB
Specific Gravity, Urine: 1.015 (ref 1.005–1.030)
pH: 6 (ref 5.0–8.0)

## 2018-04-02 MED ORDER — ARIPIPRAZOLE 2 MG PO TABS
2.0000 mg | ORAL_TABLET | Freq: Every day | ORAL | Status: DC
  Administered 2018-04-02 – 2018-04-03 (×2): 2 mg via ORAL
  Filled 2018-04-02 (×5): qty 1

## 2018-04-02 MED ORDER — GABAPENTIN 100 MG PO CAPS
200.0000 mg | ORAL_CAPSULE | Freq: Three times a day (TID) | ORAL | Status: DC
  Administered 2018-04-02 – 2018-04-04 (×7): 200 mg via ORAL
  Filled 2018-04-02 (×12): qty 2

## 2018-04-02 MED ORDER — MIRTAZAPINE 7.5 MG PO TABS
7.5000 mg | ORAL_TABLET | Freq: Every day | ORAL | Status: DC
  Administered 2018-04-02 – 2018-04-03 (×2): 7.5 mg via ORAL
  Filled 2018-04-02 (×4): qty 1

## 2018-04-02 MED ORDER — NICOTINE 21 MG/24HR TD PT24
21.0000 mg | MEDICATED_PATCH | Freq: Every day | TRANSDERMAL | Status: DC
  Administered 2018-04-02 – 2018-04-04 (×3): 21 mg via TRANSDERMAL
  Filled 2018-04-02 (×6): qty 1

## 2018-04-02 NOTE — Progress Notes (Signed)
Pt presents with an anxious mood. Pt noted to be hyper-verbal, fidgety and anxious on approach. Pt expressed that he wanted to sign a 72 hour request for d/c form. Writer reviewed form with pt and document signed by pt.  Pt expressed that he was referred by [facility] medical office to come in for treatment. Pt expressed that he would like to get off some of his meds asap because he doesn't care to be on too many medications. Pt expressed that he haven't been feeling like himself since attending [facility]. Pt verbalized that he's been feeling "spacey" and light headed. Pt c/o bruising easily and was noted to have two quarter size bruises to his right hand. Pt verbalized that he really needs to leave soon because he needs to smoke a cigarette and has a really bad habit of smoking. Pt was order a nicotine patch at his request. Pt expressed having insomnia for a few months. Pt denies SI/HI. Pt rates depression 2/10. Anxiety 4/10.  Medications reviewed with pt. Verbal support provided. Pt encouraged to attend groups. Pt encouraged to complete suicide safety plan. Self inventory sheet completed. 15 minute checks performed for safety.  Pt compliant with tx plan.

## 2018-04-02 NOTE — Progress Notes (Signed)
Nursing Progress Note: 7p-7a D: Pt currently presents with a sad/flat/depressed affect and behavior. Pt states "I am at that moment in life where I don't know what to do with myself. I am retired and I am just trying to bide my time. I need motivation and stuff to do." Interacting appropriately with the milieu. Pt reports fair sleep during the previous night with current medication regimen. Pt did attend wrap-up group.  A: Pt provided with medications per providers orders. Pt's labs and vitals were monitored throughout the night. Pt supported emotionally and encouraged to express concerns and questions. Pt educated on medications.  R: Pt's safety ensured with 15 minute and environmental checks. Pt currently denies SI, HI, and AVH. Pt verbally contracts to seek staff if SI,HI, or AVH occurs and to consult with staff before acting on any harmful thoughts. Will continue to monitor.

## 2018-04-02 NOTE — Progress Notes (Signed)
Recreation Therapy Notes  Animal-Assisted Activity (AAA) Program Checklist/Progress Notes Patient Eligibility Criteria Checklist & Daily Group note for Rec Tx Intervention  Date: 6.25.19 Time: 62 Location: [address]   AAA/T Program Assumption of Risk Form signed by Teacher, music or Parent Legal Guardian  YES   Patient is free of allergies or sever asthma  YES  Patient reports no fear of animals YES   Patient reports no history of cruelty to animals YES   Patient understands his/her participation is voluntary YES   Patient washes hands before animal contact YES   Patient washes hands after animal contact YES   Behavioral Response: Engaged  Education: Contractor, Appropriate Animal Interaction   Education Outcome: Acknowledges understanding/In group clarification offered/Needs additional education.   Clinical Observations/Feedback: Pt attended and participated in activity.    Victorino Sparrow, LRT/CTRS         Victorino Sparrow A 04/02/2018 3:52 PM
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
## 2018-04-02 NOTE — Progress Notes (Signed)
Patient ID: Oscar Phillips, male   DOB: 28-Sep-1956, 62 y.o.   MRN: [id number]   D: Patient pleasant on approach tonight. Reports that it feels awkward being here but he is in dayroom interacting with peers at times. No active SI and contracts for safety. Offered to get him something for sleep and told him that they order trazodone. He reports that his brother told him not to take that. I also offered him vistaril if he need it for anxiety or restlessness tonight. He reports he will let me know if he needs anything.  A: Staff will monitor on q 15 minute checks, follow treatment plan, and give medications as ordered. R: Cooperative on the unit.
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
## 2018-04-02 NOTE — Progress Notes (Signed)
Pt signed a 72 hour request for d/c form on 04/02/18 at 0800 am

## 2018-04-02 NOTE — B[facility] Suicide Risk Assessment (Signed)
[place] INPATIENT:  Family/Significant Other Suicide Prevention Education  Suicide Prevention Education:  Contact Attempts: Stefan Markarian, brother [phone]) has been identified by the patient as the family member/significant other with whom the patient will be residing, and identified as the person(s) who will aid the patient in the event of a mental health crisis.  With written consent from the patient, two attempts were made to provide suicide prevention education, prior to and/or following the patient's discharge.  We were unsuccessful in providing suicide prevention education.  A suicide education pamphlet was given to the patient to share with family/significant other.  Date and time of first attempt:04/02/18 / 4:14pm   Marylee Floras 04/02/2018, 4:13 PM

## 2018-04-02 NOTE — BHH Group Notes (Signed)
Adult Psychoeducational Group Note  Date:  04/02/2018 Time:  1:18 PM  Group Topic/Focus:  Goals Group:   The focus of this group is to help patients establish daily goals to achieve during treatment and discuss how the patient can incorporate goal setting into their daily lives to aide in recovery.  Participation Level:  Active  Participation Quality:  Appropriate  Affect:  Appropriate  Cognitive:  Alert  Insight: Appropriate  Engagement in Group:  Engaged  Modes of Intervention:  Discussion  Additional Comments:  Patient's goal is to hopefully get a combination medication that works best for him. Patient was engaged and participated in mindfulness activity from Adult Unit Workbook.     Bethann Punches R Nilson Tabora 04/02/2018, 1:18 PM

## 2018-04-02 NOTE — Plan of Care (Signed)
  Problem: Health Behavior/Discharge Planning: Goal: Compliance with treatment plan for underlying cause of condition will improve Outcome: Progressing   Problem: Safety: Goal: Periods of time without injury will increase Outcome: Progressing   Problem: Activity: Goal: Sleeping patterns will improve Outcome: Not Progressing  Pt expressed difficulty sleeping last night. Pt reports insomnia for the last few months.

## 2018-04-02 NOTE — Progress Notes (Signed)
NUTRITION ASSESSMENT  Pt identified as at risk on the Malnutrition Screen Tool  INTERVENTION: 1. Educated patient on the importance of nutrition and encouraged intake of food and beverages. 2. Discussed weight goals. 3. Supplements: continue Ensure Enlive BID, each supplement provides 350 kcal and 20 grams of protein.  NUTRITION DIAGNOSIS: Unintentional weight loss related to sub-optimal intake as evidenced by pt report.   Goal: Pt to meet >/= 90% of their estimated nutrition needs.  Monitor:  PO intake  Assessment:  Patient sent to [facility] by PCP d/t displaying manic behavior. Her note states question of if this was related to recent initiation of Cymbalta or Gabapentin or if alcohol abuse covered underlying bipolar disorder previously. Notes indicate that patient was recently discharged from [facility] and that earlier this month he was at [facility] for detox from alcohol and Xanax.   Per notes, patient reported only sleeping for 30 minutes in the 5 days PTA and that he had lost 10-15 lbs in the 2 months PTA. He had reported alcohol abuse for 41 years and Xanax abuse for 30 years.  Per chart review, he has lost 16 lbs (10% body weight) in the past 11 months. This is not significant for time frame. Continue to encourage PO intakes of meals, supplement, and snacks.    62 y.o. male  Height: Ht Readings from Last 1 Encounters:  04/01/18 5\' 6"  (1.676 m)    Weight: Wt Readings from Last 1 Encounters:  04/01/18 149 lb (67.6 kg)    Weight Hx: Wt Readings from Last 10 Encounters:  04/01/18 149 lb (67.6 kg)  04/01/18 145 lb 0.6 oz (65.8 kg)  03/19/18 147 lb (66.7 kg)  03/02/18 147 lb 7.8 oz (66.9 kg)  04/20/17 165 lb (74.8 kg)  09/15/16 161 lb (73 kg)  09/12/16 164 lb (74.4 kg)  07/21/16 167 lb 6 oz (75.9 kg)  05/24/16 165 lb 4 oz (75 kg)  07/13/15 165 lb 6.4 oz (75 kg)    BMI:  Body mass index is 24.05 kg/m. Pt meets criteria for normal weight based on  current BMI.  Estimated Nutritional Needs: Kcal: 25-30 kcal/kg Protein: > 1 gram protein/kg Fluid: 1 ml/kcal  Diet Order:  Diet Order           Diet Heart Room service appropriate? Yes; Fluid consistency: Thin  Diet effective now         Pt is also offered choice of unit snacks mid-morning and mid-afternoon.  Pt is eating as desired.   Lab results and medications reviewed.     Jarome Matin, MS, RD, LDN, [facility] Inpatient Clinical Dietitian Pager # [phone] After hours/weekend pager # [phone]
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
## 2018-04-02 NOTE — Progress Notes (Signed)
Adult Psychoeducational Group Note  Date:  04/02/2018 Time:  11:19 PM  Group Topic/Focus:  Wrap-Up Group:   The focus of this group is to help patients review their daily goal of treatment and discuss progress on daily workbooks.  Participation Level:  Active  Participation Quality:  Appropriate  Affect:  Appropriate  Cognitive:  Alert  Insight: Appropriate  Engagement in Group:  Engaged  Modes of Intervention:  Discussion and Support  Additional Comments:  Pt was active in group. Pt said his goal for today was to talk with treatment team. Pt said he felt good about his discussion. Pt said a positive about today was that he felt more alert than he did on his first day. 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  Inetta Fermo 04/02/2018, 11:19 PM

## 2018-04-03 ENCOUNTER — Encounter (HOSPITAL_COMMUNITY): Payer: Self-pay | Admitting: Behavioral Health

## 2018-04-03 DIAGNOSIS — F332 Major depressive disorder, recurrent severe without psychotic features: Principal | ICD-10-CM

## 2018-04-03 DIAGNOSIS — Z79899 Other long term (current) drug therapy: Secondary | ICD-10-CM

## 2018-04-03 LAB — BASIC METABOLIC PANEL
Anion gap: 8 (ref 5–15)
BUN: 23 mg/dL (ref 8–23)
CALCIUM: 9.3 mg/dL (ref 8.9–10.3)
CO2: 32 mmol/L (ref 22–32)
Chloride: 102 mmol/L (ref 98–111)
Creatinine, Ser: 0.9 mg/dL (ref 0.61–1.24)
Glucose, Bld: 78 mg/dL (ref 70–99)
POTASSIUM: 3.9 mmol/L (ref 3.5–5.1)
Sodium: 142 mmol/L (ref 135–145)

## 2018-04-03 LAB — CBC WITH DIFFERENTIAL/PLATELET
BASOS ABS: 0 10*3/uL (ref 0.0–0.1)
BASOS PCT: 0 %
EOS ABS: 0.1 10*3/uL (ref 0.0–0.7)
EOS PCT: 1 %
HCT: 42 % (ref 39.0–52.0)
Hemoglobin: 14.3 g/dL (ref 13.0–17.0)
LYMPHS PCT: 35 %
Lymphs Abs: 3.5 10*3/uL (ref 0.7–4.0)
MCH: 32 pg (ref 26.0–34.0)
MCHC: 34 g/dL (ref 30.0–36.0)
MCV: 94 fL (ref 78.0–100.0)
Monocytes Absolute: 0.6 10*3/uL (ref 0.1–1.0)
Monocytes Relative: 6 %
Neutro Abs: 5.7 10*3/uL (ref 1.7–7.7)
Neutrophils Relative %: 58 %
PLATELETS: 327 10*3/uL (ref 150–400)
RBC: 4.47 MIL/uL (ref 4.22–5.81)
RDW: 13.2 % (ref 11.5–15.5)
WBC: 10 10*3/uL (ref 4.0–10.5)

## 2018-04-03 LAB — TSH: TSH: 0.947 u[IU]/mL (ref 0.350–4.500)

## 2018-04-03 MED ORDER — ARIPIPRAZOLE 5 MG PO TABS
5.0000 mg | ORAL_TABLET | Freq: Every day | ORAL | Status: DC
  Administered 2018-04-04: 5 mg via ORAL
  Filled 2018-04-03 (×3): qty 1

## 2018-04-03 NOTE — Progress Notes (Signed)
Recreation Therapy Notes  Date: 6.26.19 Time: 0930 Location: 300 Hall Dayroom  Group Topic: Stress Management  Goal Area(s) Addresses:  Patient will verbalize importance of using healthy stress management.  Patient will identify positive emotions associated with healthy stress management.   Intervention: Stress Management  Activity :  Body Scan Meditation.  LRT introduced the stress management technique of meditation.  LRT played meditation to allow patients to take note of any sensations or tensions they may be feeling.  Patients were to follow along as meditation played.  Education:  Stress Management, Discharge Planning.   Education Outcome: Acknowledges edcuation/In group clarification offered/Needs additional education  Clinical Observations/Feedback: Pt did not attend group.     Victorino Sparrow, LRT/CTRS         Victorino Sparrow A 04/03/2018 12:13 PM

## 2018-04-03 NOTE — Progress Notes (Signed)
Nursing Progress Note: 7p-7a D: Pt currently presents with a anxious/flat/sad affect and behavior. Interacting appropriately with the milieu. Pt reports good sleep during the previous night with current medication regimen. Pt did attend wrap-up group.  A: Pt provided with medications per providers orders. Pt's labs and vitals were monitored throughout the night. Pt supported emotionally and encouraged to express concerns and questions. Pt educated on medications.  R: Pt's safety ensured with 15 minute and environmental checks. Pt currently denies SI, HI, and AVH. Pt verbally contracts to seek staff if SI,HI, or AVH occurs and to consult with staff before acting on any harmful thoughts. Will continue to monitor.

## 2018-04-03 NOTE — Tx Team (Signed)
Interdisciplinary Treatment and Diagnostic Plan Update  04/03/2018 Time of Session: 9:30am Oscar Phillips MRN: [id number]  Principal Diagnosis: MDD (major depressive disorder), recurrent episode, severe ([place])  Secondary Diagnoses: Principal Problem:   MDD (major depressive disorder), recurrent episode, severe ([place]) Active Problems:   GAD (generalized anxiety disorder)   Current Medications:  Current Facility-Administered Medications  Medication Dose Route Frequency Provider Last Rate Last Dose  . acetaminophen (TYLENOL) tablet 650 mg  650 mg Oral Q6H PRN Niel Hummer, NP      . alum & mag hydroxide-simeth (MAALOX/MYLANTA) 200-200-20 MG/5ML suspension 30 mL  30 mL Oral Q4H PRN Elmarie Shiley A, NP      . amLODipine (NORVASC) tablet 10 mg  10 mg Oral Daily Elmarie Shiley A, NP   10 mg at 04/03/18 0810  . [START ON 04/04/2018] ARIPiprazole (ABILIFY) tablet 5 mg  5 mg Oral Daily Mordecai Maes, NP      . feeding supplement (ENSURE ENLIVE) (ENSURE ENLIVE) liquid 237 mL  237 mL Oral BID BM Sharma Covert, MD   237 mL at 04/02/18 1557  . folic acid (FOLVITE) tablet 1 mg  1 mg Oral Daily Elmarie Shiley A, NP   1 mg at 04/03/18 1194  . gabapentin (NEURONTIN) capsule 200 mg  200 mg Oral TID Cobos, Myer Peer, MD   200 mg at 04/03/18 1210  . hydrOXYzine (ATARAX/VISTARIL) tablet 25 mg  25 mg Oral Q6H PRN Niel Hummer, NP      . irbesartan (AVAPRO) tablet 150 mg  150 mg Oral Daily Elmarie Shiley A, NP   150 mg at 04/03/18 0811  . magnesium hydroxide (MILK OF MAGNESIA) suspension 30 mL  30 mL Oral Daily PRN Elmarie Shiley A, NP      . mirtazapine (REMERON) tablet 7.5 mg  7.5 mg Oral QHS Cobos, Myer Peer, MD   7.5 mg at 04/02/18 2205  . nicotine (NICODERM CQ - dosed in mg/24 hours) patch 21 mg  21 mg Transdermal Daily Cobos, Myer Peer, MD   21 mg at 04/03/18 0811  . thiamine (VITAMIN B-1) tablet 100 mg  100 mg Oral Daily Elmarie Shiley A, NP   100 mg at 04/03/18 1740   PTA  Medications: Facility-Administered Medications Prior to Admission  Medication Dose Route Frequency Provider Last Rate Last Dose  . cyanocobalamin ((VITAMIN B-12)) injection 1,000 mcg  1,000 mcg Intramuscular Q30 days Janith Lima, MD   1,000 mcg at 05/25/15 0805   Medications Prior to Admission  Medication Sig Dispense Refill Last Dose  . albuterol (PROVENTIL HFA;VENTOLIN HFA) 108 (90 Base) MCG/ACT inhaler Inhale 2 puffs into the lungs every 6 (six) hours as needed for wheezing or shortness of breath. 1 Inhaler 2 Taking  . amLODipine (NORVASC) 10 MG tablet Take 1 tablet (10 mg total) by mouth daily. 30 tablet 0 Taking  . DULoxetine (CYMBALTA) 60 MG capsule   0 Taking  . folic acid (FOLVITE) 1 MG tablet Take 1 mg by mouth daily.   Taking  . gabapentin (NEURONTIN) 100 MG capsule TK 1 C PO TID  0 Taking  . hydrOXYzine (VISTARIL) 25 MG capsule Take 25 mg by mouth 3 (three) times daily as needed.   Taking  . propranolol (INDERAL) 10 MG tablet Take 10 mg by mouth 3 (three) times daily as needed (anxiety).   Not Taking  . thiamine 100 MG tablet Take 1 tablet (100 mg total) by mouth daily. 30 tablet 0 Taking  . valsartan (DIOVAN)  160 MG tablet Take 1 tablet (160 mg total) by mouth daily. 30 tablet 0 Taking    Patient Stressors: Medication change or noncompliance Substance abuse  Patient Strengths: Ability for insight Average or above average intelligence Capable of independent living General fund of knowledge Motivation for treatment/growth  Treatment Modalities: Medication Management, Group therapy, Case management,  1 to 1 session with clinician, Psychoeducation, Recreational therapy.   Physician Treatment Plan for Primary Diagnosis: MDD (major depressive disorder), recurrent episode, severe ([place]) Long Term Goal(s): Improvement in symptoms so as ready for discharge Improvement in symptoms so as ready for discharge   Short Term Goals: Ability to identify changes in lifestyle to reduce  recurrence of condition will improve Ability to identify and develop effective coping behaviors will improve Ability to identify triggers associated with substance abuse/mental health issues will improve  Medication Management: Evaluate patient's response, side effects, and tolerance of medication regimen.  Therapeutic Interventions: 1 to 1 sessions, Unit Group sessions and Medication administration.  Evaluation of Outcomes: Not Met  Physician Treatment Plan for Secondary Diagnosis: Principal Problem:   MDD (major depressive disorder), recurrent episode, severe ([place]) Active Problems:   GAD (generalized anxiety disorder)  Long Term Goal(s): Improvement in symptoms so as ready for discharge Improvement in symptoms so as ready for discharge   Short Term Goals: Ability to identify changes in lifestyle to reduce recurrence of condition will improve Ability to identify and develop effective coping behaviors will improve Ability to identify triggers associated with substance abuse/mental health issues will improve     Medication Management: Evaluate patient's response, side effects, and tolerance of medication regimen.  Therapeutic Interventions: 1 to 1 sessions, Unit Group sessions and Medication administration.  Evaluation of Outcomes: Not Met   RN Treatment Plan for Primary Diagnosis: MDD (major depressive disorder), recurrent episode, severe ([place]) Long Term Goal(s): Knowledge of disease and therapeutic regimen to maintain health will improve  Short Term Goals: Ability to identify and develop effective coping behaviors will improve and Compliance with prescribed medications will improve  Medication Management: RN will administer medications as ordered by provider, will assess and evaluate patient's response and provide education to patient for prescribed medication. RN will report any adverse and/or side effects to prescribing provider.  Therapeutic Interventions: 1 on 1 counseling  sessions, Psychoeducation, Medication administration, Evaluate responses to treatment, Monitor vital signs and CBGs as ordered, Perform/monitor CIWA, COWS, AIMS and Fall Risk screenings as ordered, Perform wound care treatments as ordered.  Evaluation of Outcomes: Not Met   LCSW Treatment Plan for Primary Diagnosis: MDD (major depressive disorder), recurrent episode, severe ([place]) Long Term Goal(s): Safe transition to appropriate next level of care at discharge, Engage patient in therapeutic group addressing interpersonal concerns.  Short Term Goals: Engage patient in aftercare planning with referrals and resources  Therapeutic Interventions: Assess for all discharge needs, 1 to 1 time with Social worker, Explore available resources and support systems, Assess for adequacy in community support network, Educate family and significant other(s) on suicide prevention, Complete Psychosocial Assessment, Interpersonal group therapy.  Evaluation of Outcomes: Not Met   Progress in Treatment: Attending groups: Yes. Participating in groups: Yes. Taking medication as prescribed: Yes. Toleration medication: Yes. Family/Significant other contact made: Yes, individual(s) contacted:  the patient's brother Patient understands diagnosis: Yes. Discussing patient identified problems/goals with staff: Yes. Medical problems stabilized or resolved: Yes. Denies suicidal/homicidal ideation: Yes. Issues/concerns per patient self-inventory: No. Other:   New problem(s) identified: None   New Short Term/Long Term Goal(s): medication  stabilization, elimination of SI thoughts, development of comprehensive mental wellness plan.   Patient Goals:  "my medications aren't working"  Discharge Plan or Barriers:   Reason for Continuation of Hospitalization: [place] Medication stabilization  Estimated Length of Stay: 3-5 days   Attendees: Patient: Oscar Phillips 04/03/2018 3:53 PM  Physician: Dr. Neita Garnet, MD  04/03/2018 3:53 PM  Nursing: Oren Beckmann 04/03/2018 3:53 PM  RN Care Manager:X 04/03/2018 3:53 PM  Social Worker: Radonna Ricker, [place] 04/03/2018 3:53 PM  Recreational Therapist: Rhunette Croft 04/03/2018 3:53 PM  Other: X 04/03/2018 3:53 PM  Other: X 04/03/2018 3:53 PM  Other:X 04/03/2018 3:53 PM    Scribe for Treatment Team: Marylee Floras, [place] 04/03/2018 3:53 PM

## 2018-04-03 NOTE — BHH Suicide Risk Assessment (Signed)
[place] INPATIENT:  Family/Significant Other Suicide Prevention Education  Suicide Prevention Education:  Education Completed; Emidio Warrell, brother [phone])  has been identified by the patient as the family member/significant other with whom the patient will be residing, and identified as the person(s) who will aid the patient in the event of a mental health crisis (suicidal ideations/suicide attempt).  With written consent from the patient, the family member/significant other has been provided the following suicide prevention education, prior to the and/or following the discharge of the patient.  The suicide prevention education provided includes the following:  Suicide risk factors  Suicide prevention and interventions  National Suicide Hotline telephone number  [facility] assessment telephone number  [facility] Emergency Assistance [place] and/or Residential Mobile Crisis Unit telephone number  Request made of family/significant other to:  Remove weapons (e.g., guns, rifles, knives), all items previously/currently identified as safety concern.    Remove drugs/medications (over-the-counter, prescriptions, illicit drugs), all items previously/currently identified as a safety concern.  The family member/significant other verbalizes understanding of the suicide prevention education information provided.  The family member/significant other agrees to remove the items of safety concern listed above.  Marylee Floras 04/03/2018, 10:59 AM

## 2018-04-03 NOTE — Therapy (Signed)
Occupational Therapy Group Treatment Note  Date:  04/03/2018 Time:  2:37 PM  Group Topic/Focus:  Stress Management  Participation Level:  Minimal  Participation Quality:  Appropriate  Affect:  Flat  Cognitive:  Appropriate  Insight: Improving  Engagement in Group:  Limited  Modes of Intervention:  Activity, Discussion, Education and Socialization  Additional Comments:    S: Pt did not make comment this date  O: Stress management group completed to use as productive coping strategy, to help mitigate maladaptive coping to integrate in functional BADL/IADL. Education given on the definition of stress and its cognitive, behavioral, emotional, and physical effects on the body. Stress symptom checklist completed. Stress management tool worksheet discussed to educate on unhealthy vs healthy coping skills to manage stress to improve community integration. Self control circle activity completed to identify areas of control and areas not within personal control. Education given on use of progressive muscle relaxation. PMR script delivered to facilitate relaxation response to help increase ability to engage in BADL. Coloring and relaxation guide handouts given at the end of the session.   A: Pt presents with flat affect, not sharing or offering any comments this date. Pt completed stress symptom checklist, showing very high signs of stress. Pt engaged in stress management tools worksheet, offering examples and how to improve current practices. Self control circle activity completed, pt recognizing she cannot control how others speak to her- but he can control his reactions. Pt engaged in [place] script, with much enjoyment and significant relaxation response. Pt with eyes closed shaking head vigorously "yes" when asked if enjoyed.  P: Pt provided with education on stress management activities to implement into daily routine. Handouts given to facilitate carryover when reintegrating into community        [facility], [place], OTR/L  [employer] 04/03/2018, 2:37 PM

## 2018-04-03 NOTE — Progress Notes (Signed)
DAR NOTE: Patient presents with anxious affect and depressed mood.  Pt has been visible in the dayroom with peers. Pt stated he woke up one time last night, but not sure if he went back to sleep or what happened after that. Reports poor sleep, good appetite, high energy, and good concentration.  Denies pain, auditory and visual hallucinations.  Rates depression at 2, hopelessness at 0, and anxiety at 5.  Maintained on routine safety checks.  Medications given as prescribed.  Support and encouragement offered as needed.  Attended group and participated.  States goal for today is " discharge." Patient observed socializing with peers in the dayroom.  Offered no complaint.
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
## 2018-04-04 ENCOUNTER — Encounter (HOSPITAL_COMMUNITY): Payer: Self-pay | Admitting: Behavioral Health

## 2018-04-04 MED ORDER — HYDROXYZINE HCL 25 MG PO TABS: 25.0000 mg | ORAL_TABLET | Freq: Four times a day (QID) | ORAL | 1 refills | Status: DC | PRN

## 2018-04-04 MED ORDER — GABAPENTIN 100 MG PO CAPS: 200.0000 mg | ORAL_CAPSULE | Freq: Three times a day (TID) | ORAL | 1 refills | Status: DC

## 2018-04-04 MED ORDER — IRBESARTAN 150 MG PO TABS: 150.0000 mg | ORAL_TABLET | Freq: Every day | ORAL | 1 refills | Status: DC

## 2018-04-04 MED ORDER — IRBESARTAN 150 MG PO TABS: 150.0000 mg | ORAL_TABLET | Freq: Every day | ORAL | 0 refills | Status: DC

## 2018-04-04 MED ORDER — MIRTAZAPINE 7.5 MG PO TABS: 7.5000 mg | ORAL_TABLET | Freq: Every day | ORAL | 1 refills | Status: DC

## 2018-04-04 MED ORDER — MIRTAZAPINE 7.5 MG PO TABS: 7.5000 mg | ORAL_TABLET | Freq: Every day | ORAL | 0 refills | Status: DC

## 2018-04-04 MED ORDER — GABAPENTIN 100 MG PO CAPS: 200.0000 mg | ORAL_CAPSULE | Freq: Three times a day (TID) | ORAL | 0 refills | Status: DC

## 2018-04-04 MED ORDER — AMLODIPINE BESYLATE 10 MG PO TABS: 10.0000 mg | ORAL_TABLET | Freq: Every day | ORAL | 0 refills | Status: DC

## 2018-04-04 MED ORDER — ARIPIPRAZOLE 5 MG PO TABS: 5.0000 mg | ORAL_TABLET | Freq: Every day | ORAL | 1 refills | Status: DC

## 2018-04-04 MED ORDER — AMLODIPINE BESYLATE 10 MG PO TABS: 10.0000 mg | ORAL_TABLET | Freq: Every day | ORAL | 1 refills | Status: DC

## 2018-04-04 MED ORDER — HYDROXYZINE HCL 25 MG PO TABS: 25.0000 mg | ORAL_TABLET | Freq: Four times a day (QID) | ORAL | 0 refills | Status: DC | PRN

## 2018-04-04 MED ORDER — ARIPIPRAZOLE 5 MG PO TABS: 5.0000 mg | ORAL_TABLET | Freq: Every day | ORAL | 0 refills | Status: DC
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
## 2018-04-04 NOTE — Progress Notes (Signed)
Pt discharged to lobby. Pt was stable and appreciative at that time. All papers and prescriptions were given and valuables returned. Verbal understanding expressed. Denies SI/HI and A/VH. Pt given opportunity to express concerns and ask questions.  

## 2018-04-04 NOTE — Progress Notes (Signed)
  [facility] Adult Case Management Discharge Plan :  Will you be returning to the same living situation after discharge:  Yes,  patient plans to return home. Patient reports his brother is staying with him for additional support At discharge, do you have transportation home?: Yes,  patient's brother will pick him up at discharge Do you have the ability to pay for your medications: Yes,  BCBS, support from family  Release of information consent forms completed and in the chart;  Patient's signature needed at discharge.  Patient to Follow up at: Follow-up Information    Chucky May, MD. Go on 05/10/2018.   Specialty:  Psychiatry Why:  Appointment for medication management is Tuesday, 05/10/18 at 1:45pm. Please be sure to bring any discharge paperwork from this hospitalization.  Contact information: [address] [place] [address] [phone]           Next level of care provider has access to [place] and Suicide Prevention discussed: Yes,  with the patient's brother  Have you used any form of tobacco in the last 30 days? (Cigarettes, Smokeless Tobacco, Cigars, and/or Pipes): No  Has patient been referred to the Quitline?: N/A patient is not a smoker  Patient has been referred for addiction treatment: N/A  Marylee Floras, [place] 04/04/2018, 1:53 PM
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
## 2018-04-17 ENCOUNTER — Ambulatory Visit (INDEPENDENT_AMBULATORY_CARE_PROVIDER_SITE_OTHER): Payer: BLUE CROSS/BLUE SHIELD | Admitting: Internal Medicine

## 2018-04-17 ENCOUNTER — Other Ambulatory Visit (INDEPENDENT_AMBULATORY_CARE_PROVIDER_SITE_OTHER): Payer: BLUE CROSS/BLUE SHIELD

## 2018-04-17 ENCOUNTER — Encounter: Payer: Self-pay | Admitting: Internal Medicine

## 2018-04-17 ENCOUNTER — Ambulatory Visit (INDEPENDENT_AMBULATORY_CARE_PROVIDER_SITE_OTHER)
Admission: RE | Admit: 2018-04-17 | Discharge: 2018-04-17 | Disposition: A | Discharge status: Home / Self Care | Payer: BLUE CROSS/BLUE SHIELD | Source: Ambulatory Visit | Attending: Internal Medicine | Admitting: Internal Medicine

## 2018-04-17 VITALS — BP 128/70 | HR 90 | Temp 98.1°F | Resp 16 | Ht 68.0 in | Wt 148.0 lb

## 2018-04-17 DIAGNOSIS — R1310 Dysphagia, unspecified: Secondary | ICD-10-CM

## 2018-04-17 DIAGNOSIS — R945 Abnormal results of liver function studies: Secondary | ICD-10-CM | POA: Diagnosis not present

## 2018-04-17 DIAGNOSIS — R972 Elevated prostate specific antigen [PSA]: Secondary | ICD-10-CM

## 2018-04-17 DIAGNOSIS — R0600 Dyspnea, unspecified: Secondary | ICD-10-CM

## 2018-04-17 DIAGNOSIS — E538 Deficiency of other specified B group vitamins: Secondary | ICD-10-CM

## 2018-04-17 DIAGNOSIS — R7989 Other specified abnormal findings of blood chemistry: Secondary | ICD-10-CM

## 2018-04-17 DIAGNOSIS — F17200 Nicotine dependence, unspecified, uncomplicated: Secondary | ICD-10-CM | POA: Diagnosis not present

## 2018-04-17 DIAGNOSIS — Z0000 Encounter for general adult medical examination without abnormal findings: Secondary | ICD-10-CM

## 2018-04-17 DIAGNOSIS — R1319 Other dysphagia: Secondary | ICD-10-CM | POA: Insufficient documentation

## 2018-04-17 DIAGNOSIS — R05 Cough: Secondary | ICD-10-CM

## 2018-04-17 DIAGNOSIS — E785 Hyperlipidemia, unspecified: Secondary | ICD-10-CM | POA: Diagnosis not present

## 2018-04-17 DIAGNOSIS — J449 Chronic obstructive pulmonary disease, unspecified: Secondary | ICD-10-CM

## 2018-04-17 DIAGNOSIS — I10 Essential (primary) hypertension: Secondary | ICD-10-CM | POA: Diagnosis not present

## 2018-04-17 DIAGNOSIS — R0609 Other forms of dyspnea: Secondary | ICD-10-CM

## 2018-04-17 DIAGNOSIS — R059 Cough, unspecified: Secondary | ICD-10-CM

## 2018-04-17 DIAGNOSIS — K2100 Gastro-esophageal reflux disease with esophagitis, without bleeding: Secondary | ICD-10-CM

## 2018-04-17 DIAGNOSIS — Z1211 Encounter for screening for malignant neoplasm of colon: Secondary | ICD-10-CM

## 2018-04-17 DIAGNOSIS — F1027 Alcohol dependence with alcohol-induced persisting dementia: Secondary | ICD-10-CM

## 2018-04-17 DIAGNOSIS — F1010 Alcohol abuse, uncomplicated: Secondary | ICD-10-CM | POA: Diagnosis not present

## 2018-04-17 DIAGNOSIS — J4489 Other specified chronic obstructive pulmonary disease: Secondary | ICD-10-CM

## 2018-04-17 DIAGNOSIS — R9431 Abnormal electrocardiogram [ECG] [EKG]: Secondary | ICD-10-CM

## 2018-04-17 DIAGNOSIS — I739 Peripheral vascular disease, unspecified: Secondary | ICD-10-CM

## 2018-04-17 LAB — VITAMIN B12: Vitamin B-12: 299 pg/mL (ref 211–911)

## 2018-04-17 LAB — FOLATE

## 2018-04-17 MED ORDER — TIOTROPIUM BROMIDE MONOHYDRATE 2.5 MCG/ACT IN AERS: 2.0000 | INHALATION_SPRAY | Freq: Every day | RESPIRATORY_TRACT | 1 refills | Status: DC

## 2018-04-17 MED ORDER — DEXLANSOPRAZOLE 60 MG PO CPDR: 60.0000 mg | DELAYED_RELEASE_CAPSULE | Freq: Every day | ORAL | 1 refills | Status: DC

## 2018-04-17 NOTE — Patient Instructions (Signed)

## 2018-04-17 NOTE — Progress Notes (Signed)
Subjective:  Patient ID: Oscar Phillips, male    DOB: 12-17-1955  Age: 62 y.o. MRN: [id number]  CC: Annual Exam; Hypertension; and Gastroesophageal Reflux   HPI Oscar Phillips presents for a CPX.  Over the last 2 months he has been treated for alcohol use disorder.  He has been admitted with complications and detox.  He was also enrolled in an intensive outpatient program.  He is not compliant with any of his IOP recommendations.  He consumed a sixpack of beer the day prior to this visit.   He complains of a chronic cough sometimes productive of yellow phlegm.  He uses albuterol for symptom relief.  He also complains of diffuse, bilateral anterior chest wall pain.  He complains of DOE but does not have any exertional chest pain.  He has had a few night sweats recently but denies fever, chills, or hemoptysis.  He complains of chronic fatigue and diffuse weakness.  He also complains of heartburn and dysphagia.  He has lost weight over the last year or 2.  He is not treating this.  Outpatient Medications Prior to Visit  Medication Sig Dispense Refill  . albuterol (PROVENTIL HFA;VENTOLIN HFA) 108 (90 Base) MCG/ACT inhaler Inhale 2 puffs into the lungs every 6 (six) hours as needed for wheezing or shortness of breath. 1 Inhaler 2  . folic acid (FOLVITE) 1 MG tablet Take 1 mg by mouth daily.    Marland Kitchen gabapentin (NEURONTIN) 100 MG capsule Take 2 capsules (200 mg total) by mouth 3 (three) times daily. 180 capsule 1  . irbesartan (AVAPRO) 150 MG tablet Take 1 tablet (150 mg total) by mouth daily. 30 tablet 1  . thiamine 100 MG tablet Take 1 tablet (100 mg total) by mouth daily. 30 tablet 0  . amLODipine (NORVASC) 10 MG tablet Take 1 tablet (10 mg total) by mouth daily. 30 tablet 1  . ARIPiprazole (ABILIFY) 5 MG tablet Take 1 tablet (5 mg total) by mouth daily. (Patient not taking: Reported on 04/17/2018) 30 tablet 1  . mirtazapine (REMERON) 7.5 MG tablet Take 1 tablet (7.5 mg total) by mouth at  bedtime. (Patient not taking: Reported on 04/17/2018) 30 tablet 1  . hydrOXYzine (ATARAX/VISTARIL) 25 MG tablet Take 1 tablet (25 mg total) by mouth every 6 (six) hours as needed for anxiety. (Patient not taking: Reported on 04/17/2018) 30 tablet 1  . cyanocobalamin ((VITAMIN B-12)) injection 1,000 mcg      No facility-administered medications prior to visit.     ROS Review of Systems  Constitutional: Positive for fatigue and unexpected weight change. Negative for appetite change, chills, diaphoresis and fever.  HENT: Positive for trouble swallowing. Negative for facial swelling, nosebleeds, sore throat and voice change.   Eyes: Negative for visual disturbance.  Respiratory: Positive for cough, shortness of breath and wheezing. Negative for chest tightness and stridor.   Cardiovascular: Positive for chest pain.  Gastrointestinal: Negative.  Negative for abdominal pain, constipation, diarrhea, nausea and vomiting.  Endocrine: Positive for cold intolerance. Negative for heat intolerance.  Genitourinary: Negative.  Negative for difficulty urinating, dysuria, penile pain, penile swelling, scrotal swelling, testicular pain and urgency.  Musculoskeletal: Negative.  Negative for arthralgias and myalgias.  Skin: Negative.  Negative for color change, pallor and rash.  Allergic/Immunologic: Negative.   Neurological: Positive for light-headedness. Negative for dizziness and weakness.  Hematological: Negative for adenopathy. Does not bruise/bleed easily.  Psychiatric/Behavioral: Positive for confusion, decreased concentration, dysphoric mood and sleep disturbance. Negative for agitation, behavioral problems, hallucinations  and self-injury. The patient is nervous/anxious. The patient is not hyperactive.     Objective:  BP 128/70 (BP Location: Left Arm, Patient Position: Sitting, Cuff Size: Normal)   Pulse 90   Temp 98.1 F (36.7 C) (Oral)   Resp 16   Ht 5\' 8"  (1.727 m)   Wt 148 lb (67.1 kg)   SpO2  98%   BMI 22.50 kg/m   BP Readings from Last 3 Encounters:  04/17/18 128/70  04/01/18 136/60  03/21/18 (!) 151/84    Wt Readings from Last 3 Encounters:  04/17/18 148 lb (67.1 kg)  04/01/18 145 lb 0.6 oz (65.8 kg)  03/19/18 147 lb (66.7 kg)    Physical Exam  Constitutional: He is oriented to person, place, and time. Vital signs are normal. No distress.  HENT:  Mouth/Throat: Oropharynx is clear and moist. No oropharyngeal exudate.  Eyes: Conjunctivae are normal. No scleral icterus.  Neck: Normal range of motion. Neck supple. No JVD present. No thyromegaly present.  Cardiovascular: Normal rate, regular rhythm and normal heart sounds.  No murmur heard. Pulses:      Carotid pulses are 1+ on the right side, and 1+ on the left side.      Radial pulses are 1+ on the right side, and 1+ on the left side.       Femoral pulses are 1+ on the right side, and 1+ on the left side.      Popliteal pulses are 0 on the right side, and 0 on the left side.       Dorsalis pedis pulses are 0 on the right side, and 0 on the left side.       Posterior tibial pulses are 0 on the right side, and 0 on the left side.  EKG ----  Sinus  Rhythm  -Right bundle branch block with left axis -bifascicular block.   -Right atrial enlargement.   ABNORMAL    Pulmonary/Chest: Effort normal. No respiratory distress. He has no decreased breath sounds. He has no wheezes. He has no rhonchi. He has no rales.  Abdominal: Soft. Normal appearance and bowel sounds are normal. He exhibits no distension, no ascites and no mass. There is no hepatosplenomegaly. There is no tenderness. No hernia. Hernia confirmed negative in the right inguinal area and confirmed negative in the left inguinal area.  Genitourinary: Rectum normal and penis normal. Rectal exam shows no external hemorrhoid, no internal hemorrhoid, no fissure, no mass, no tenderness, anal tone normal and guaiac negative stool. Prostate is enlarged (1+ smooth symm BPH).  Prostate is not tender. Right testis shows mass. Right testis shows no swelling and no tenderness. Right testis is descended. Left testis shows no mass, no swelling and no tenderness. Left testis is descended. Circumcised. No penile erythema or penile tenderness. No discharge found.  Genitourinary Comments: There is a soft, squishy, nontender lesion in the right epididymis  Musculoskeletal: Normal range of motion. He exhibits no edema, tenderness or deformity.  Lymphadenopathy:    He has no cervical adenopathy. No inguinal adenopathy noted on the right or left side.  Neurological: He is alert and oriented to person, place, and time. He displays normal reflexes. No sensory deficit. He exhibits normal muscle tone. Coordination normal.  Skin: Skin is warm and dry. No rash noted. He is not diaphoretic. No erythema.  Psychiatric: Judgment normal. His mood appears anxious. His affect is not angry. His speech is delayed and tangential. His speech is not rapid and/or pressured and not  slurred. He is slowed. He is not agitated, not withdrawn, not actively hallucinating and not combative. Thought content is not paranoid and not delusional. Cognition and memory are impaired. He does not exhibit a depressed mood. He expresses no homicidal and no suicidal ideation. He is communicative. He exhibits abnormal recent memory and abnormal remote memory. He is inattentive.  Vitals reviewed.   Lab Results  Component Value Date   WBC 10.0 04/03/2018   HGB 14.3 04/03/2018   HCT 42.0 04/03/2018   PLT 327 04/03/2018   GLUCOSE 78 04/03/2018   CHOL 162 03/02/2018   TRIG 115 03/02/2018   HDL 83 03/02/2018   LDLDIRECT 154.1 06/03/2010   LDLCALC 56 03/02/2018   ALT 74 (H) 03/19/2018   AST 58 (H) 03/19/2018   NA 142 04/03/2018   K 3.9 04/03/2018   CL 102 04/03/2018   CREATININE 0.90 04/03/2018   BUN 23 04/03/2018   CO2 32 04/03/2018   TSH 0.947 04/03/2018   PSA 4.72 (H) 05/24/2016   INR 0.91 03/02/2018   HGBA1C  5.5 03/02/2018    No results found.  Assessment & Plan:   Jurell was seen today for annual exam, hypertension and gastroesophageal reflux.  Diagnoses and all orders for this visit:  PSA elevation- I will recheck his PSA and if it is elevated will consider referral to GU to screen for prostate cancer. -     PSA, total and free; Future  Routine general medical examination at a health care facility- Exam completed, labs reviewed, vaccines reviewed and updated, he is referred for colon cancer screening, patient education material was given. -     PSA, total and free; Future  Essential hypertension- His BP is soft and he is symptomatic so I have asked him to stop taking the CCB but to continue the ARB.  Chronic alcohol abuse- I will screen him for complications such as thiamine deficiency.  He is agreeable to start attending [facility] meetings. -     Vitamin B1; Future  B12 deficiency- His B12 and folate levels are normal. -     Vitamin B12; Future -     Folate; Future  Colon cancer screening -     Cologuard -     Ambulatory referral to Gastroenterology  Esophageal dysphagia- I have asked him to see GI to be screened for upper GI pathology. -     Ambulatory referral to Gastroenterology  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 exacerbation ([place])- He was asked to quit smoking.  I have asked him to start using a LAMA inhaler for symptom relief.  I gave him a sample of Spiriva Respimat and showed him how to use it.  He demonstrated proficiency with its use. -     Tiotropium Bromide Monohydrate (SPIRIVA RESPIMAT) 2.5 MCG/ACT AERS; Inhale 2 puffs into the lungs daily.  Gastroesophageal reflux disease with esophagitis -     dexlansoprazole (DEXILANT) 60 MG capsule; Take 1 capsule (60 mg total) by mouth daily.  TOBACCO USE -     Ambulatory Referral for Lung Cancer Scre  Cough- I will check his chest x-ray for mass or infiltrate. -     DG Chest 2 View; Future  Elevated LFTs -this is most  likely caused by alcohol abuse.  I will screen him for hepatitis a and B infections. -     Hepatitis A antibody, total; Future -     Hepatitis B core antibody, total; Future -     Hepatitis B surface antibody; Future  DOE (dyspnea on exertion)- His EKG shows right-sided strain consistent with COPD and possibly emphysema.  I do not think his symptoms are ischemic in nature as he had a normal Lexiscan about 3 years ago. -     EKG 12-Lead  Hyperlipidemia with target LDL less than 100- He has a low ASCVD risk score.  At this time I do not recommend statin therapy.  If however it is confirmed that he has PAD then will consider starting a low-dose statin.  Abnormal electrocardiogram (ECG) (EKG)- as above  PAD (peripheral artery disease) ([place])- I cannot feel the pulses in his feet.  I have ordered an ABI to see if he has significant PAD. 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  I will screen him for syphilis.  His recent HIV and hep C test were negative and his vitamin B12 and folate levels are normal today. -     RPR; Future   I have discontinued Terrick Beidleman's amLODipine and hydrOXYzine. I am also having him start on Tiotropium Bromide Monohydrate and dexlansoprazole. Additionally, I am having him maintain his albuterol, thiamine, folic acid, irbesartan, ARIPiprazole, mirtazapine, and gabapentin. We will stop administering cyanocobalamin.  Meds ordered this encounter  Medications  . Tiotropium Bromide Monohydrate (SPIRIVA RESPIMAT) 2.5 MCG/ACT AERS    Sig: Inhale 2 puffs into the lungs daily.    Dispense:  12 g    Refill:  1  . dexlansoprazole (DEXILANT) 60 MG capsule    Sig: Take 1 capsule (60 mg total) by mouth daily.    Dispense:  90 capsule    Refill:  1     Follow-up: Return in about 1 month (around 05/15/2018).  Scarlette Calico, MD

## 2018-04-18 ENCOUNTER — Other Ambulatory Visit: Payer: Self-pay | Admitting: Internal Medicine

## 2018-04-18 DIAGNOSIS — R972 Elevated prostate specific antigen [PSA]: Secondary | ICD-10-CM

## 2018-04-18 LAB — PSA: PSA: 8.2

## 2018-04-18 LAB — TIQ-NTM

## 2018-04-19 ENCOUNTER — Other Ambulatory Visit: Payer: BLUE CROSS/BLUE SHIELD

## 2018-04-19 DIAGNOSIS — F1027 Alcohol dependence with alcohol-induced persisting dementia: Secondary | ICD-10-CM

## 2018-04-21 LAB — HEPATITIS B SURFACE ANTIBODY,QUALITATIVE: HEP B S AB: NONREACTIVE

## 2018-04-21 LAB — PSA, TOTAL AND FREE
PSA, % FREE: 13 % — AB (ref >25)
PSA, FREE: 1.1 ng/mL
PSA, Total: 8.2 ng/mL — ABNORMAL HIGH (ref <=4.0)

## 2018-04-21 LAB — RPR (MONITOR) W/REFL: RPR (MONITOR) W/REFL TITER: NONREACTIVE

## 2018-04-21 LAB — TEST AUTHORIZATION

## 2018-04-21 LAB — HEPATITIS A ANTIBODY, TOTAL: Hepatitis A AB,Total: NONREACTIVE

## 2018-04-21 LAB — VITAMIN B1: Vitamin B1 (Thiamine): 47 nmol/L — ABNORMAL HIGH (ref 8–30)

## 2018-04-21 LAB — HEPATITIS B CORE ANTIBODY, TOTAL: Hep B Core Total Ab: NONREACTIVE

## 2018-04-22 ENCOUNTER — Ambulatory Visit (HOSPITAL_COMMUNITY)
Admission: RE | Admit: 2018-04-22 | Discharge: 2018-04-22 | Disposition: A | Discharge status: Home / Self Care | Payer: BLUE CROSS/BLUE SHIELD | Source: Ambulatory Visit | Attending: Internal Medicine | Admitting: Internal Medicine

## 2018-04-22 DIAGNOSIS — I739 Peripheral vascular disease, unspecified: Secondary | ICD-10-CM

## 2018-04-22 LAB — RPR: RPR: NONREACTIVE

## 2018-05-02 ENCOUNTER — Telehealth: Payer: Self-pay

## 2018-05-02 NOTE — Telephone Encounter (Signed)
Approved today via Cover My Meds.  Effective from 05/02/2018 through 04/30/2021.  Key: [id number] - Rx #: [id number]

## 2018-05-22 ENCOUNTER — Encounter: Payer: Self-pay | Admitting: Internal Medicine

## 2018-07-03 ENCOUNTER — Emergency Department (HOSPITAL_COMMUNITY): Payer: BLUE CROSS/BLUE SHIELD

## 2018-07-03 ENCOUNTER — Other Ambulatory Visit: Payer: Self-pay

## 2018-07-03 ENCOUNTER — Ambulatory Visit (HOSPITAL_COMMUNITY): Payer: BLUE CROSS/BLUE SHIELD

## 2018-07-03 ENCOUNTER — Encounter (HOSPITAL_COMMUNITY): Payer: Self-pay | Admitting: Emergency Medicine

## 2018-07-03 ENCOUNTER — Inpatient Hospital Stay (HOSPITAL_COMMUNITY)
Admission: EM | Admit: 2018-07-03 | Discharge: 2018-07-09 | DRG: 286 | Disposition: A | Discharge status: Home / Self Care | Payer: BLUE CROSS/BLUE SHIELD | Attending: Internal Medicine | Admitting: Internal Medicine

## 2018-07-03 ENCOUNTER — Encounter (HOSPITAL_COMMUNITY): Payer: Self-pay | Admitting: Cardiology

## 2018-07-03 ENCOUNTER — Ambulatory Visit: Payer: BLUE CROSS/BLUE SHIELD | Admitting: Family

## 2018-07-03 ENCOUNTER — Encounter: Payer: Self-pay | Admitting: Family

## 2018-07-03 DIAGNOSIS — F17210 Nicotine dependence, cigarettes, uncomplicated: Secondary | ICD-10-CM | POA: Diagnosis present

## 2018-07-03 DIAGNOSIS — N179 Acute kidney failure, unspecified: Secondary | ICD-10-CM | POA: Diagnosis not present

## 2018-07-03 DIAGNOSIS — F10231 Alcohol dependence with withdrawal delirium: Secondary | ICD-10-CM | POA: Diagnosis not present

## 2018-07-03 DIAGNOSIS — I426 Alcoholic cardiomyopathy: Secondary | ICD-10-CM

## 2018-07-03 DIAGNOSIS — F10239 Alcohol dependence with withdrawal, unspecified: Secondary | ICD-10-CM | POA: Diagnosis present

## 2018-07-03 DIAGNOSIS — E871 Hypo-osmolality and hyponatremia: Secondary | ICD-10-CM | POA: Diagnosis not present

## 2018-07-03 DIAGNOSIS — I5030 Unspecified diastolic (congestive) heart failure: Secondary | ICD-10-CM | POA: Diagnosis not present

## 2018-07-03 DIAGNOSIS — Z515 Encounter for palliative care: Secondary | ICD-10-CM

## 2018-07-03 DIAGNOSIS — I739 Peripheral vascular disease, unspecified: Secondary | ICD-10-CM | POA: Diagnosis not present

## 2018-07-03 DIAGNOSIS — R4020 Unspecified coma: Secondary | ICD-10-CM | POA: Diagnosis not present

## 2018-07-03 DIAGNOSIS — R579 Shock, unspecified: Secondary | ICD-10-CM

## 2018-07-03 DIAGNOSIS — I5041 Acute combined systolic (congestive) and diastolic (congestive) heart failure: Secondary | ICD-10-CM | POA: Diagnosis not present

## 2018-07-03 DIAGNOSIS — F329 Major depressive disorder, single episode, unspecified: Secondary | ICD-10-CM | POA: Diagnosis not present

## 2018-07-03 DIAGNOSIS — K2100 Gastro-esophageal reflux disease with esophagitis, without bleeding: Secondary | ICD-10-CM

## 2018-07-03 DIAGNOSIS — I4510 Unspecified right bundle-branch block: Secondary | ICD-10-CM | POA: Diagnosis not present

## 2018-07-03 DIAGNOSIS — R4781 Slurred speech: Secondary | ICD-10-CM | POA: Diagnosis not present

## 2018-07-03 DIAGNOSIS — Z66 Do not resuscitate: Secondary | ICD-10-CM | POA: Diagnosis present

## 2018-07-03 DIAGNOSIS — F1027 Alcohol dependence with alcohol-induced persisting dementia: Secondary | ICD-10-CM | POA: Diagnosis present

## 2018-07-03 DIAGNOSIS — R531 Weakness: Secondary | ICD-10-CM | POA: Diagnosis not present

## 2018-07-03 DIAGNOSIS — I110 Hypertensive heart disease with heart failure: Secondary | ICD-10-CM | POA: Diagnosis present

## 2018-07-03 DIAGNOSIS — E872 Acidosis, unspecified: Secondary | ICD-10-CM

## 2018-07-03 DIAGNOSIS — K219 Gastro-esophageal reflux disease without esophagitis: Secondary | ICD-10-CM | POA: Diagnosis present

## 2018-07-03 DIAGNOSIS — I10 Essential (primary) hypertension: Secondary | ICD-10-CM | POA: Diagnosis not present

## 2018-07-03 DIAGNOSIS — R7989 Other specified abnormal findings of blood chemistry: Secondary | ICD-10-CM | POA: Diagnosis not present

## 2018-07-03 DIAGNOSIS — L89621 Pressure ulcer of left heel, stage 1: Secondary | ICD-10-CM | POA: Diagnosis present

## 2018-07-03 DIAGNOSIS — I420 Dilated cardiomyopathy: Secondary | ICD-10-CM | POA: Diagnosis not present

## 2018-07-03 DIAGNOSIS — Z79899 Other long term (current) drug therapy: Secondary | ICD-10-CM | POA: Diagnosis not present

## 2018-07-03 DIAGNOSIS — E876 Hypokalemia: Secondary | ICD-10-CM | POA: Diagnosis present

## 2018-07-03 DIAGNOSIS — R451 Restlessness and agitation: Secondary | ICD-10-CM | POA: Diagnosis present

## 2018-07-03 DIAGNOSIS — R109 Unspecified abdominal pain: Secondary | ICD-10-CM

## 2018-07-03 DIAGNOSIS — J439 Emphysema, unspecified: Secondary | ICD-10-CM | POA: Diagnosis present

## 2018-07-03 DIAGNOSIS — F1020 Alcohol dependence, uncomplicated: Secondary | ICD-10-CM | POA: Diagnosis not present

## 2018-07-03 DIAGNOSIS — Z7951 Long term (current) use of inhaled steroids: Secondary | ICD-10-CM | POA: Diagnosis not present

## 2018-07-03 DIAGNOSIS — I5181 Takotsubo syndrome: Principal | ICD-10-CM | POA: Diagnosis present

## 2018-07-03 DIAGNOSIS — Z880 Allergy status to penicillin: Secondary | ICD-10-CM | POA: Diagnosis not present

## 2018-07-03 DIAGNOSIS — I2510 Atherosclerotic heart disease of native coronary artery without angina pectoris: Secondary | ICD-10-CM | POA: Diagnosis not present

## 2018-07-03 DIAGNOSIS — J9690 Respiratory failure, unspecified, unspecified whether with hypoxia or hypercapnia: Secondary | ICD-10-CM | POA: Diagnosis not present

## 2018-07-03 DIAGNOSIS — I214 Non-ST elevation (NSTEMI) myocardial infarction: Secondary | ICD-10-CM | POA: Diagnosis not present

## 2018-07-03 DIAGNOSIS — R570 Cardiogenic shock: Secondary | ICD-10-CM | POA: Diagnosis not present

## 2018-07-03 DIAGNOSIS — R571 Hypovolemic shock: Secondary | ICD-10-CM | POA: Diagnosis not present

## 2018-07-03 DIAGNOSIS — L8990 Pressure ulcer of unspecified site, unspecified stage: Secondary | ICD-10-CM

## 2018-07-03 DIAGNOSIS — J9601 Acute respiratory failure with hypoxia: Secondary | ICD-10-CM | POA: Diagnosis not present

## 2018-07-03 DIAGNOSIS — E785 Hyperlipidemia, unspecified: Secondary | ICD-10-CM | POA: Diagnosis not present

## 2018-07-03 DIAGNOSIS — I959 Hypotension, unspecified: Secondary | ICD-10-CM

## 2018-07-03 DIAGNOSIS — R410 Disorientation, unspecified: Secondary | ICD-10-CM | POA: Diagnosis not present

## 2018-07-03 DIAGNOSIS — J449 Chronic obstructive pulmonary disease, unspecified: Secondary | ICD-10-CM

## 2018-07-03 DIAGNOSIS — R55 Syncope and collapse: Secondary | ICD-10-CM | POA: Diagnosis not present

## 2018-07-03 LAB — TYPE AND SCREEN
ABO/RH(D): A POS
ABO/RH(D): A POS
ANTIBODY SCREEN: NEGATIVE
ANTIBODY SCREEN: NEGATIVE

## 2018-07-03 LAB — I-STAT CHEM 8, ED
BUN: 8 mg/dL (ref 8–23)
CALCIUM ION: 1.03 mmol/L — AB (ref 1.15–1.40)
Chloride: 86 mmol/L — ABNORMAL LOW (ref 98–111)
Creatinine, Ser: 1.7 mg/dL — ABNORMAL HIGH (ref 0.61–1.24)
Glucose, Bld: 165 mg/dL — ABNORMAL HIGH (ref 70–99)
HCT: 46 % (ref 39.0–52.0)
HEMOGLOBIN: 15.6 g/dL (ref 13.0–17.0)
Potassium: 2.9 mmol/L — ABNORMAL LOW (ref 3.5–5.1)
Sodium: 123 mmol/L — ABNORMAL LOW (ref 135–145)
TCO2: 24 mmol/L (ref 22–32)

## 2018-07-03 LAB — CORTISOL: CORTISOL PLASMA: 32.6 ug/dL

## 2018-07-03 LAB — MAGNESIUM: MAGNESIUM: 2.3 mg/dL (ref 1.7–2.4)

## 2018-07-03 LAB — COMPREHENSIVE METABOLIC PANEL
ALT: 94 U/L — ABNORMAL HIGH (ref 0–44)
ANION GAP: 14 (ref 5–15)
AST: 87 U/L — ABNORMAL HIGH (ref 15–41)
Albumin: 3.8 g/dL (ref 3.5–5.0)
Alkaline Phosphatase: 60 U/L (ref 38–126)
BUN: 11 mg/dL (ref 8–23)
CHLORIDE: 88 mmol/L — AB (ref 98–111)
CO2: 24 mmol/L (ref 22–32)
Calcium: 8.7 mg/dL — ABNORMAL LOW (ref 8.9–10.3)
Creatinine, Ser: 1.65 mg/dL — ABNORMAL HIGH (ref 0.61–1.24)
GFR calc non Af Amer: 43 mL/min — ABNORMAL LOW (ref >60)
GFR, EST AFRICAN AMERICAN: 50 mL/min — AB (ref >60)
Glucose, Bld: 170 mg/dL — ABNORMAL HIGH (ref 70–99)
POTASSIUM: 2.9 mmol/L — AB (ref 3.5–5.1)
Sodium: 126 mmol/L — ABNORMAL LOW (ref 135–145)
Total Bilirubin: 1.8 mg/dL — ABNORMAL HIGH (ref 0.3–1.2)
Total Protein: 6.6 g/dL (ref 6.5–8.1)

## 2018-07-03 LAB — CBC WITH DIFFERENTIAL/PLATELET
BASOS PCT: 0 %
Basophils Absolute: 0 10*3/uL (ref 0.0–0.1)
EOS PCT: 0 %
Eosinophils Absolute: 0 10*3/uL (ref 0.0–0.7)
HCT: 43.7 % (ref 39.0–52.0)
Hemoglobin: 15.3 g/dL (ref 13.0–17.0)
Lymphocytes Relative: 17 %
Lymphs Abs: 2.1 10*3/uL (ref 0.7–4.0)
MCH: 32.2 pg (ref 26.0–34.0)
MCHC: 35 g/dL (ref 30.0–36.0)
MCV: 92 fL (ref 78.0–100.0)
MONO ABS: 1.1 10*3/uL — AB (ref 0.1–1.0)
Monocytes Relative: 9 %
Neutro Abs: 9.4 10*3/uL — ABNORMAL HIGH (ref 1.7–7.7)
Neutrophils Relative %: 74 %
PLATELETS: 216 10*3/uL (ref 150–400)
RBC: 4.75 MIL/uL (ref 4.22–5.81)
RDW: 13 % (ref 11.5–15.5)
WBC: 12.6 10*3/uL — ABNORMAL HIGH (ref 4.0–10.5)

## 2018-07-03 LAB — ECHOCARDIOGRAM COMPLETE
Height: 69 in
Weight: 2400 oz

## 2018-07-03 LAB — POC OCCULT BLOOD, ED: Fecal Occult Bld: POSITIVE — AB

## 2018-07-03 LAB — APTT: aPTT: 35 seconds (ref 24–36)

## 2018-07-03 LAB — CBG MONITORING, ED: Glucose-Capillary: 175 mg/dL — ABNORMAL HIGH (ref 70–99)

## 2018-07-03 LAB — SALICYLATE LEVEL

## 2018-07-03 LAB — I-STAT CG4 LACTIC ACID, ED
LACTIC ACID, VENOUS: 4.19 mmol/L — AB (ref 0.5–1.9)
LACTIC ACID, VENOUS: 1.62 mmol/L (ref 0.5–1.9)

## 2018-07-03 LAB — ETHANOL

## 2018-07-03 LAB — T4, FREE: FREE T4: 1.14 ng/dL (ref 0.82–1.77)

## 2018-07-03 LAB — PROTIME-INR
INR: 0.86
Prothrombin Time: 11.6 seconds (ref 11.4–15.2)

## 2018-07-03 LAB — I-STAT TROPONIN, ED: TROPONIN I, POC: 5.36 ng/mL — AB (ref 0.00–0.08)

## 2018-07-03 LAB — TROPONIN I: Troponin I: 5.86 ng/mL (ref <0.03)

## 2018-07-03 LAB — TSH: TSH: 1.334 u[IU]/mL (ref 0.350–4.500)

## 2018-07-03 LAB — ACETAMINOPHEN LEVEL

## 2018-07-03 LAB — ABO/RH: ABO/RH(D): A POS

## 2018-07-03 MED ORDER — IPRATROPIUM-ALBUTEROL 0.5-2.5 (3) MG/3ML IN SOLN
3.0000 mL | Freq: Four times a day (QID) | RESPIRATORY_TRACT | Status: DC | PRN
  Administered 2018-07-04: 3 mL via RESPIRATORY_TRACT
  Filled 2018-07-03: qty 3

## 2018-07-03 MED ORDER — MAGNESIUM SULFATE 2 GM/50ML IV SOLN
2.0000 g | Freq: Once | INTRAVENOUS | Status: AC
  Administered 2018-07-03: 2 g via INTRAVENOUS
  Filled 2018-07-03: qty 50

## 2018-07-03 MED ORDER — SODIUM CHLORIDE 0.9 % IV BOLUS
2000.0000 mL | Freq: Once | INTRAVENOUS | Status: AC
  Administered 2018-07-03: 2000 mL via INTRAVENOUS

## 2018-07-03 MED ORDER — ASPIRIN 81 MG PO CHEW
324.0000 mg | CHEWABLE_TABLET | ORAL | Status: AC
  Filled 2018-07-03: qty 4

## 2018-07-03 MED ORDER — SODIUM CHLORIDE 0.9 % IV BOLUS
1000.0000 mL | Freq: Once | INTRAVENOUS | Status: AC
  Administered 2018-07-03: 1000 mL via INTRAVENOUS

## 2018-07-03 MED ORDER — HEPARIN BOLUS VIA INFUSION
4000.0000 [IU] | Freq: Once | INTRAVENOUS | Status: AC
  Administered 2018-07-03: 4000 [IU] via INTRAVENOUS
  Filled 2018-07-03: qty 4000

## 2018-07-03 MED ORDER — SODIUM CHLORIDE 0.9 % IV SOLN
2.0000 g | Freq: Once | INTRAVENOUS | Status: AC
  Administered 2018-07-03: 2 g via INTRAVENOUS
  Filled 2018-07-03: qty 2

## 2018-07-03 MED ORDER — POTASSIUM CHLORIDE 10 MEQ/100ML IV SOLN
10.0000 meq | INTRAVENOUS | Status: AC
  Administered 2018-07-03 (×2): 10 meq via INTRAVENOUS
  Filled 2018-07-03 (×2): qty 100

## 2018-07-03 MED ORDER — ASPIRIN 300 MG RE SUPP: 300.0000 mg | RECTAL | Status: AC

## 2018-07-03 MED ORDER — NICOTINE 14 MG/24HR TD PT24
14.0000 mg | MEDICATED_PATCH | Freq: Every day | TRANSDERMAL | Status: DC
  Administered 2018-07-04 – 2018-07-09 (×6): 14 mg via TRANSDERMAL
  Filled 2018-07-03 (×7): qty 1

## 2018-07-03 MED ORDER — VANCOMYCIN HCL 10 G IV SOLR
1250.0000 mg | Freq: Once | INTRAVENOUS | Status: AC
  Administered 2018-07-03: 1250 mg via INTRAVENOUS
  Filled 2018-07-03: qty 1250

## 2018-07-03 MED ORDER — NOREPINEPHRINE 4 MG/250ML-% IV SOLN
0.0000 ug/min | Freq: Once | INTRAVENOUS | Status: AC
  Administered 2018-07-03: 2 ug/min via INTRAVENOUS
  Filled 2018-07-03: qty 250

## 2018-07-03 MED ORDER — ASPIRIN 81 MG PO CHEW
324.0000 mg | CHEWABLE_TABLET | Freq: Once | ORAL | Status: AC
  Administered 2018-07-03: 324 mg via ORAL
  Filled 2018-07-03: qty 4

## 2018-07-03 MED ORDER — HEPARIN (PORCINE) IN NACL 100-0.45 UNIT/ML-% IJ SOLN
1200.0000 [IU]/h | INTRAMUSCULAR | Status: DC
  Administered 2018-07-03: 800 [IU]/h via INTRAVENOUS
  Filled 2018-07-03: qty 250

## 2018-07-03 MED ORDER — SODIUM CHLORIDE 0.9 % IV BOLUS
1000.0000 mL | Freq: Once | INTRAVENOUS | Status: AC
  Administered 2018-07-04: 1000 mL via INTRAVENOUS

## 2018-07-03 MED ORDER — NOREPINEPHRINE 4 MG/250ML-% IV SOLN
0.0000 ug/min | INTRAVENOUS | Status: DC
  Administered 2018-07-03: 6 ug/min via INTRAVENOUS
  Filled 2018-07-03: qty 250

## 2018-07-03 NOTE — ED Notes (Signed)
MD contacting cardiology.

## 2018-07-03 NOTE — ED Notes (Signed)
Spoke with Dr. Glenford Bayley, plasma lactic acid that was due at 17:14, does not need to collected due to an I-stat lactic acid was just performed at 17:06.

## 2018-07-03 NOTE — ED Notes (Addendum)
1000 NS bolus infusing via pressure bag. MD made aware.

## 2018-07-03 NOTE — ED Notes (Signed)
Provider at bedside

## 2018-07-03 NOTE — Progress Notes (Signed)
A consult was received from an ED physician for vancomycin per pharmacy dosing.  The patient's profile has been reviewed for ht/wt/allergies/indication/available labs.   A one time order has been placed for vancomycin 1250 mg IV once.  Further antibiotics/pharmacy consults should be ordered by admitting physician if indicated.                       Thank you, Napoleon Form 07/03/2018  3:29 PM

## 2018-07-03 NOTE — ED Notes (Signed)
MD at bedside. 

## 2018-07-03 NOTE — ED Notes (Signed)
2nd 1000NS bolus started with pressure bag.

## 2018-07-03 NOTE — ED Notes (Signed)
Notified [facility] for transfer to [place].

## 2018-07-03 NOTE — ED Notes (Signed)
Crash cart at bedside. Patient cardiac monitoring. Chest pads are on.

## 2018-07-03 NOTE — ED Notes (Signed)
ED provider [place] notified that patient has a critical chem8 and lactic acid values

## 2018-07-03 NOTE — H&P (Signed)
NAME:  Oscar Phillips, MRN:  [id number], DOB:  Jan 14, 1956, LOS: 0 ADMISSION DATE:  07/03/2018, CONSULTATION DATE:  07/03/18 REFERRING MD:  Billy Fischer, CHIEF COMPLAINT: Generalized fatigue, listlessness  Brief History   Patient with a 9-day history of generalized fatigue, listlessness Has not been eating for about 6days-generally has no appetite Symptoms started 9 days ago with diarrhea-felt he had a GI bug History of alcoholism History of COPD Recent hospitalization has been to behavioral health, alcohol-related issues   Past Medical History   He drinks daily-he claimed is a moderate drinker 11/2 to 2 packs a day smoker  Significant Hospital Events   Hypotension Behavioral health admission 04/02/2018  Consults: date of consult/date signed off & final recs:  Cardiology 07/03/2018--to be [facility] to [facility]  Procedures (surgical and bedside):  Bedside echocardiogram reportedly with evidence of hypokinesis, low ejection fraction  Significant Diagnostic Tests:  X-ray 07/03/2018-no acute infiltrate Echocardiogram performed at bedside-reportedly low ejection fraction 06/01/2015 myocardial perfusion imaging did reveal ejection fraction of 63% 07/06/2015-PFT with mild obstructive disease-affecting small airways  Micro Data:  Blood cultures requested and pending at present Did receive a dose of vancomycin  Antimicrobials:  Vancomycin 07/03/2018>> Cefepime 07/03/2018>>  Subjective:  Awake Just feels listless Denies any pain or discomfort He has a usual intermittent cough-attributes this to smoking-there has not been any change Has not any fevers or chills  Objective   Blood pressure (!) 88/56, pulse 87, temperature 97.7 F (36.5 C), temperature source Oral, resp. rate 17, SpO2 97 %.        Intake/Output Summary (Last 24 hours) at 07/03/2018 1633 Last data filed at 07/03/2018 1600 Gross per 24 hour  Intake 2100 ml  Output -  Net 2100 ml   There were no vitals  filed for this visit.  Examination: General: Awake, interactive, underweight HENT: Dry oral mucosa, no JVD, no thyromegaly Lungs: Clear breath sounds bilaterally Cardiovascular: S1-S2 appreciated Abdomen: Bowel sounds appreciated Extremities: No clubbing, no edema Neuro: Alert, oriented x3, moving all extremities GU: Has not urinated yet  Resolved Hospital Problem list     Assessment & Plan:  Hypotension Likely related to hypovolemia He is receiving fluid boluses Started on pressors at the same time-on Levophed at 6 mics per minute Blood pressure appears to be improving  Hypovolemic shock Likely secondary to decreased intake plus recent diarrhea Continue fluid resuscitation Wean off pressors  Cardiomyopathy In the context of heavy alcohol use 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 He does not have a recent echocardiogram on record  Hyponatremia Hypokalemia This may be related to the recent GI symptoms  Acute kidney injury Related to hypovolemia  Obstructive lung disease Bronchodilators   History of alcoholism Has been to rehab Recent admission related to alcoholism Continues to drink actively  Recent gastroenteritis Diarrhea resolved but still with anorexia  History of major depression  Symptoms not suggestive of an ongoing infection   Based on his echocardiogram that showed a low ejection fraction, hypotension Cardiology did see patient and requested patient be admitted to the heart floor  Patient will be admitted to 2 heart, [place]  Echo findings may be related to alcoholic cardiomyopathy with symptoms worsened by significant dehydration from his recent gastrointestinal symptoms  Disposition / Summary of Today's Plan 07/03/18   Will admit to 2 heart , [place]    Diet: As tolerated DVT prophylaxis: Lovenox GI prophylaxis: Pepcid Mobility: As tolerated Code Status: Full code Family Communication: No family at bedside  Labs   CBC:  Recent  Labs  Lab 07/03/18 1502 07/03/18 1506  WBC  --  12.6*  NEUTROABS  --  9.4*  HGB 15.6 15.3  HCT 46.0 43.7  MCV  --  92.0  PLT  --  409    Basic Metabolic Panel: Recent Labs  Lab 07/03/18 1502 07/03/18 1506  NA 123* 126*  K 2.9* 2.9*  CL 86* 88*  CO2  --  24  GLUCOSE 165* 170*  BUN 8 11  CREATININE 1.70* 1.65*  CALCIUM  --  8.7*   GFR: CrCl cannot be calculated (Unknown ideal weight.). Recent Labs  Lab 07/03/18 1503 07/03/18 1506  WBC  --  12.6*  LATICACIDVEN 4.19*  --     Liver Function Tests: Recent Labs  Lab 07/03/18 1506  AST 87*  ALT 94*  ALKPHOS 60  BILITOT 1.8*  PROT 6.6  ALBUMIN 3.8   No results for input(s): LIPASE, AMYLASE in the last 168 hours. No results for input(s): AMMONIA in the last 168 hours.  ABG    Component Value Date/Time   TCO2 24 07/03/2018 1502     Coagulation Profile: No results for input(s): INR, PROTIME in the last 168 hours.  Cardiac Enzymes: No results for input(s): CKTOTAL, CKMB, CKMBINDEX, TROPONINI in the last 168 hours.  HbA1C: Hgb A1c MFr Bld  Date/Time Value Ref Range Status  03/02/2018 05:13 PM 5.5 4.8 - 5.6 % Final    Comment:    (NOTE) Pre diabetes:          5.7%-6.4% Diabetes:              >6.4% Glycemic control for   <7.0% adults with diabetes   05/20/2015 02:18 PM 5.7 4.6 - 6.5 % Final    Comment:    Glycemic Control Guidelines for People with Diabetes:Non Diabetic:  <6%Goal of Therapy: <7%Additional Action Suggested:  >8%     CBG: Recent Labs  Lab 07/03/18 1453  GLUCAP 175*    Admitting History of Present Illness.   Recent GI symptoms Not been taking a lot orally Significant alcohol use history Significant smoking history  Review of Systems:   Review of Systems  Constitutional: Positive for malaise/fatigue.  HENT: Negative.   Eyes: Negative.   Respiratory: Positive for cough. Negative for hemoptysis, sputum production, shortness of breath and wheezing.   Cardiovascular: Negative  for chest pain, orthopnea and leg swelling.  Gastrointestinal: Negative for nausea and vomiting.       Recent diarrhea Decreased appetite  Genitourinary: Negative.  Negative for dysuria, frequency and urgency.  Musculoskeletal: Negative.   Neurological: Positive for dizziness and weakness.  Endo/Heme/Allergies: Negative.   Psychiatric/Behavioral: Positive for depression.     Past Medical History  He,  has a past medical history of Abdominal bruit, Alcoholism ([place]), Anxiety, B12 deficiency, Bronchitis, COPD (chronic obstructive pulmonary disease) ([place]), GERD (gastroesophageal reflux disease), H/O trichomonal urethritis, History of panic attacks, History of rectal bleeding, HLD (hyperlipidemia), HTN (hypertension), OCD (obsessive compulsive disorder), and PSA elevation.   Surgical History    Past Surgical History:  Procedure Laterality Date  . ELBOW FRACTURE SURGERY Left   . LUMBAR DISC SURGERY    . TONSILLECTOMY  1965     Social History   Social History   Socioeconomic History  . Marital status: Single    Spouse name: Not on file  . Number of children: 0  . Years of education: Not on file  . Highest education level: Not on file  Occupational History  .  Occupation: Web designer    Comment: Retired   Scientific laboratory technician  . Financial resource strain: Not on file  . Food insecurity:    Worry: Not on file    Inability: Not on file  . Transportation needs:    Medical: Not on file    Non-medical: Not on file  Tobacco Use  . Smoking status: Current Every Day Smoker    Packs/day: 1.00    Years: 25.00    Pack years: 25.00    Types: Cigarettes  . Smokeless tobacco: Never Used  Substance and Sexual Activity  . Alcohol use: Yes    Alcohol/week: 15.0 standard drinks    Types: 15 Glasses of wine per week  . Drug use: No  . Sexual activity: Not Currently  Lifestyle  . Physical activity:    Days per week: Not on file    Minutes per session: Not on file  . Stress: Not on file   Relationships  . Social connections:    Talks on phone: Not on file    Gets together: Not on file    Attends religious service: Not on file    Active member of club or organization: Not on file    Attends meetings of clubs or organizations: Not on file    Relationship status: Not on file  . Intimate partner violence:    Fear of current or ex partner: Not on file    Emotionally abused: Not on file    Physically abused: Not on file    Forced sexual activity: Not on file  Other Topics Concern  . Not on file  Social History Narrative   Single.  Lives alone.   ,  reports that he has been smoking cigarettes. He has a 25.00 pack-year smoking history. He has never used smokeless tobacco. He reports that he drinks about 15.0 standard drinks of alcohol per week. He reports that he does not use drugs.   Family History   His family history includes Alcohol abuse in his maternal uncle; Colon cancer in his father; Dementia in his mother; Emphysema in his paternal grandmother; Heart failure in his paternal grandmother; Hyperlipidemia in his father; Lung cancer in his maternal uncle; Stomach cancer in his father. There is no history of Diabetes, Drug abuse, Early death, Heart disease, Hypertension, Kidney disease, or Stroke.   Allergies Allergies  Allergen Reactions  . Penicillins     Has patient had a PCN reaction causing immediate rash, facial/tongue/throat swelling, SOB or lightheadedness with hypotension: Yes Has patient had a PCN reaction causing severe rash involving mucus membranes or skin necrosis: No Has patient had a PCN reaction that required hospitalization: Unknown Has patient had a PCN reaction occurring within the last 10 years: Unknown If all of the above answers are "NO", then may proceed with Cephalosporin use.      Home Medications  Prior to Admission medications   Medication Sig Start Date End Date Taking? Authorizing Provider  albuterol (PROVENTIL HFA;VENTOLIN HFA) 108 (90  Base) MCG/ACT inhaler Inhale 2 puffs into the lungs every 6 (six) hours as needed for wheezing or shortness of breath. 03/03/18   Shelly Coss, MD  amLODipine (NORVASC) 10 MG tablet TK 1 T PO QD 05/14/18   [provider]  ARIPiprazole (ABILIFY) 5 MG tablet Take 1 tablet (5 mg total) by mouth daily. 04/05/18   Mordecai Maes, NP  dexlansoprazole (DEXILANT) 60 MG capsule Take 1 capsule (60 mg total) by mouth daily. 04/17/18   Ronnald Ramp,  Arvid Right, MD  folic acid (FOLVITE) 1 MG tablet Take 1 mg by mouth daily.    [provider]  gabapentin (NEURONTIN) 100 MG capsule Take 2 capsules (200 mg total) by mouth 3 (three) times daily. 04/04/18   Mordecai Maes, NP  irbesartan (AVAPRO) 150 MG tablet Take 1 tablet (150 mg total) by mouth daily. 04/05/18   Mordecai Maes, NP  mirtazapine (REMERON) 7.5 MG tablet Take 1 tablet (7.5 mg total) by mouth at bedtime. 04/04/18   Mordecai Maes, NP  thiamine 100 MG tablet Take 1 tablet (100 mg total) by mouth daily. 03/04/18   Shelly Coss, MD  Tiotropium Bromide Monohydrate (SPIRIVA RESPIMAT) 2.5 MCG/ACT AERS Inhale 2 puffs into the lungs daily. 04/17/18   Janith Lima, MD

## 2018-07-03 NOTE — ED Notes (Signed)
Pt requested I contact his sister Ermon Sagan [phone] left VM to call the ed for a family member. Per Pt ok to share info with family.

## 2018-07-03 NOTE — ED Provider Notes (Signed)
[facility] DEPT Provider Note   CSN: [id number] Arrival date & time: 07/03/18  1434     History   Chief Complaint Chief Complaint  Patient presents with  . Loss of Consciousness  . Hypotension    HPI Oscar Phillips is a 62 y.o. male.  HPI   62 year old male with a history of hypertension, hyperlipidemia, smoking, COPD, peripheral arterial disease, alcohol abuse, and generalized anxiety disorder, major depressive disorder presents with concern for dizziness and decreased appetite.  Patient was sent by the primary care doctor's office after he had presented with low appetite and unsteadiness, and was found to be hypotensive and have a syncopal episode.  Patient reports for the last 5 days he has had a low appetite.  Denies nausea, vomiting, diarrhea, or black or bloody stools.  Denies fevers, cough, dysuria.  Reports he takes gabapentin.  Denies any change in his medications recently, medication misuse.  Denies any suicidal ideation.  Reports that he drinks alcohol every day, however did not drink alcohol yesterday.   Does report chest tightness that is chronic, reports it is from smoking. It is dull, aching. Also reports chronic unchanged cough. Denies acute change in these symptoms.    Past Medical History:  Diagnosis Date  . Abdominal bruit   . Alcoholism ([place])   . Anxiety   . B12 deficiency   . Bronchitis   . COPD (chronic obstructive pulmonary disease) ([place])   . GERD (gastroesophageal reflux disease)   . H/O trichomonal urethritis   . History of panic attacks   . History of rectal bleeding    in August 2016  . HLD (hyperlipidemia)   . HTN (hypertension)   . OCD (obsessive compulsive disorder)   . PSA elevation     Patient Active Problem List   Diagnosis Date Noted  . Shock ([place]) 07/03/2018  . Esophageal dysphagia 04/17/2018  . Elevated LFTs 04/17/2018  . PAD (peripheral artery disease) ([place]) 04/17/2018  . 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 GAD (generalized anxiety disorder)   . MDD (major depressive disorder), recurrent episode, severe ([place]) 04/01/2018  . Chronic alcohol abuse 03/02/2018  . Colon cancer screening 05/24/2016  . PSA elevation 08/23/2015  . Cough 05/20/2015  . Routine general medical examination at a health care facility 05/20/2015  . Essential hypertension 05/20/2015  . 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 exacerbation ([place]) 05/20/2015  . DOE (dyspnea on exertion) 05/20/2015  . Abnormal electrocardiogram (ECG) (EKG) 05/20/2015  . Hyperlipidemia with target LDL less than 100 05/20/2015  . B12 deficiency 09/21/2010  . Irritable bowel syndrome 09/20/2010  . TOBACCO USE 06/03/2010  . DEPRESSION 06/03/2010  . GERD 06/03/2010    Past Surgical History:  Procedure Laterality Date  . ELBOW FRACTURE SURGERY Left   . LUMBAR DISC SURGERY    . TONSILLECTOMY  1965        Home Medications    Prior to Admission medications   Medication Sig Start Date End Date Taking? Authorizing Provider  albuterol (PROVENTIL HFA;VENTOLIN HFA) 108 (90 Base) MCG/ACT inhaler Inhale 2 puffs into the lungs every 6 (six) hours as needed for wheezing or shortness of breath. 03/03/18   Shelly Coss, MD  amLODipine (NORVASC) 10 MG tablet TK 1 T PO QD 05/14/18   [provider]  ARIPiprazole (ABILIFY) 5 MG tablet Take 1 tablet (5 mg total) by mouth daily. 04/05/18   Mordecai Maes, NP  dexlansoprazole (DEXILANT) 60 MG capsule Take 1 capsule (  60 mg total) by mouth daily. 04/17/18   Janith Lima, MD  folic acid (FOLVITE) 1 MG tablet Take 1 mg by mouth daily.    [provider]  gabapentin (NEURONTIN) 100 MG capsule Take 2 capsules (200 mg total) by mouth 3 (three) times daily. 04/04/18   Mordecai Maes, NP  irbesartan (AVAPRO) 150 MG tablet Take 1 tablet (150 mg total) by mouth daily. 04/05/18   Mordecai Maes, NP  mirtazapine (REMERON) 7.5 MG  tablet Take 1 tablet (7.5 mg total) by mouth at bedtime. 04/04/18   Mordecai Maes, NP  thiamine 100 MG tablet Take 1 tablet (100 mg total) by mouth daily. 03/04/18   Shelly Coss, MD  Tiotropium Bromide Monohydrate (SPIRIVA RESPIMAT) 2.5 MCG/ACT AERS Inhale 2 puffs into the lungs daily. 04/17/18   Janith Lima, MD    Family History Family History  Problem Relation Age of Onset  . Hyperlipidemia Father   . Colon cancer Father   . Stomach cancer Father   . Dementia Mother   . Alcohol abuse Maternal Uncle   . Lung cancer Maternal Uncle   . Heart failure Paternal Grandmother   . Emphysema Paternal Grandmother   . Diabetes Neg Hx   . Drug abuse Neg Hx   . Early death Neg Hx   . Heart disease Neg Hx   . Hypertension Neg Hx   . Kidney disease Neg Hx   . Stroke Neg Hx     Social History Social History   Tobacco Use  . Smoking status: Current Every Day Smoker    Packs/day: 1.00    Years: 25.00    Pack years: 25.00    Types: Cigarettes  . Smokeless tobacco: Never Used  Substance Use Topics  . Alcohol use: Yes    Alcohol/week: 15.0 standard drinks    Types: 15 Glasses of wine per week  . Drug use: No     Allergies   Penicillins   Review of Systems Review of Systems  Constitutional: Positive for appetite change and fatigue. Negative for fever.  HENT: Negative for sore throat.   Eyes: Negative for visual disturbance.  Respiratory: Positive for chest tightness. Negative for shortness of breath.   Cardiovascular: Positive for chest pain (reports chronic).  Gastrointestinal: Negative for abdominal pain, anal bleeding, blood in stool, diarrhea, nausea and vomiting.  Genitourinary: Negative for difficulty urinating and dysuria.  Musculoskeletal: Positive for gait problem. Negative for back pain and neck stiffness.  Skin: Negative for rash.  Neurological: Positive for light-headedness. Negative for syncope and headaches.     Physical Exam Updated Vital Signs BP (!)  98/59   Pulse 84   Temp 97.7 F (36.5 C) (Oral)   Resp 13   Ht 5\' 9"  (1.753 m)   Wt 68 kg   SpO2 96%   BMI 22.15 kg/m   Physical Exam  Constitutional: He is oriented to person, place, and time. He appears well-developed and well-nourished. He has a sickly appearance. He appears ill. No distress.  HENT:  Head: Normocephalic and atraumatic.  Dry mucous membranes  Eyes: Conjunctivae and EOM are normal.  Neck: Normal range of motion.  Cardiovascular: Normal rate, regular rhythm, normal heart sounds and intact distal pulses. Exam reveals no gallop and no friction rub.  No murmur heard. Pulmonary/Chest: Effort normal and breath sounds normal. No respiratory distress. He has no wheezes. He has no rales.  Abdominal: Soft. He exhibits no distension. There is no tenderness. There is no guarding.  Musculoskeletal: He exhibits no edema.  Neurological: He is alert and oriented to person, place, and time.  Skin: Skin is warm and dry. He is not diaphoretic.  Nursing note and vitals reviewed.    ED Treatments / Results  Labs (all labs ordered are listed, but only abnormal results are displayed) Labs Reviewed  CBC WITH DIFFERENTIAL/PLATELET - Abnormal; Notable for the following components:      Result Value   WBC 12.6 (*)    Neutro Abs 9.4 (*)    Monocytes Absolute 1.1 (*)    All other components within normal limits  COMPREHENSIVE METABOLIC PANEL - Abnormal; Notable for the following components:   Sodium 126 (*)    Potassium 2.9 (*)    Chloride 88 (*)    Glucose, Bld 170 (*)    Creatinine, Ser 1.65 (*)    Calcium 8.7 (*)    AST 87 (*)    ALT 94 (*)    Total Bilirubin 1.8 (*)    GFR calc non Af Amer 43 (*)    GFR calc Af Amer 50 (*)    All other components within normal limits  ACETAMINOPHEN LEVEL - Abnormal; Notable for the following components:   Acetaminophen (Tylenol), Serum <10 (*)    All other components within normal limits  POC OCCULT BLOOD, ED - Abnormal; Notable for  the following components:   Fecal Occult Bld POSITIVE (*)    All other components within normal limits  I-STAT TROPONIN, ED - Abnormal; Notable for the following components:   Troponin i, poc 5.36 (*)    All other components within normal limits  I-STAT CG4 LACTIC ACID, ED - Abnormal; Notable for the following components:   Lactic Acid, Venous 4.19 (*)    All other components within normal limits  I-STAT CHEM 8, ED - Abnormal; Notable for the following components:   Sodium 123 (*)    Potassium 2.9 (*)    Chloride 86 (*)    Creatinine, Ser 1.70 (*)    Glucose, Bld 165 (*)    Calcium, Ion 1.03 (*)    All other components within normal limits  CBG MONITORING, ED - Abnormal; Notable for the following components:   Glucose-Capillary 175 (*)    All other components within normal limits  CULTURE, BLOOD (ROUTINE X 2)  CULTURE, BLOOD (ROUTINE X 2)  URINE CULTURE  SALICYLATE LEVEL  ETHANOL  MAGNESIUM  APTT  PROTIME-INR  RAPID URINE DRUG SCREEN, HOSP PERFORMED  URINALYSIS, ROUTINE W REFLEX MICROSCOPIC  TSH  T4, FREE  CORTISOL  HIGH SENSITIVITY CRP  LACTIC ACID, PLASMA  LACTIC ACID, PLASMA  CBC  BASIC METABOLIC PANEL  MAGNESIUM  PHOSPHORUS  HEPARIN LEVEL (UNFRACTIONATED)  I-STAT CG4 LACTIC ACID, ED  TYPE AND SCREEN  ABO/RH    EKG EKG Interpretation  Date/Time:  Wednesday July 03 2018 14:52:02 EDT Ventricular Rate:  79 PR Interval:    QRS Duration: 150 QT Interval:  461 QTC Calculation: 529 R Axis:   -103 Text Interpretation:  Sinus rhythm RAE, consider biatrial enlargement RBBB and LAFB Since last EKG, new TWI in inferior leads and ST elevation V2 Confirmed by Duffy Bruce [phone]) on 07/03/2018 4:18:16 PM   Radiology Dg Chest Portable 1 View  Result Date: 07/03/2018 CLINICAL DATA:  Syncopal episode, hypertension EXAM: PORTABLE CHEST 1 VIEW COMPARISON:  04/17/2018 FINDINGS: Defibrillator pads overlie the chest. Normal heart size and vascularity. Mild  hyperinflation, suspect COPD/emphysema. Negative for edema, effusion, collapse, consolidation, or pneumothorax. Trachea is  midline. Aorta is atherosclerotic. Bones are osteopenic IMPRESSION: No active disease. Electronically Signed   By: Jerilynn Mages.  Shick M.D.   On: 07/03/2018 16:50    Procedures .Critical Care Performed by: Gareth Morgan, MD Authorized by: Gareth Morgan, MD   Critical care provider statement:    Critical care time (minutes):  60   Critical care was necessary to treat or prevent imminent or life-threatening deterioration of the following conditions:  Cardiac failure   Critical care was time spent personally by me on the following activities:  Blood draw for specimens, development of treatment plan with patient or surrogate, discussions with consultants, evaluation of patient's response to treatment, examination of patient, obtaining history from patient or surrogate, ordering and review of radiographic studies, ordering and review of laboratory studies, ordering and performing treatments and interventions, pulse oximetry and re-evaluation of patient's condition   (including critical care time)  Medications Ordered in ED Medications  vancomycin (VANCOCIN) 1,250 mg in sodium chloride 0.9 % 250 mL IVPB (1,250 mg Intravenous New Bag/Given 07/03/18 1620)  potassium chloride 10 mEq in 100 mL IVPB (has no administration in time range)  sodium chloride 0.9 % bolus 1,000 mL (has no administration in time range)  heparin ADULT infusion 100 units/mL (25000 units/251mL sodium chloride 0.45%) (800 Units/hr Intravenous New Bag/Given 07/03/18 1711)  sodium chloride 0.9 % bolus 2,000 mL (2,000 mLs Intravenous New Bag/Given 07/03/18 1625)  ipratropium-albuterol (DUONEB) 0.5-2.5 (3) MG/3ML nebulizer solution 3 mL (has no administration in time range)  aspirin chewable tablet 324 mg (324 mg Oral Not Given 07/03/18 1718)    Or  aspirin suppository 300 mg ( Rectal See Alternative 07/03/18 1718)    norepinephrine (LEVOPHED) 4mg  in D5W 285mL premix infusion (6 mcg/min Intravenous Rate/Dose Change 07/03/18 1524)  sodium chloride 0.9 % bolus 1,000 mL (0 mLs Intravenous Stopped 07/03/18 1549)  sodium chloride 0.9 % bolus 1,000 mL (0 mLs Intravenous Stopped 07/03/18 1546)  ceFEPIme (MAXIPIME) 2 g in sodium chloride 0.9 % 100 mL IVPB (0 g Intravenous Stopped 07/03/18 1600)  magnesium sulfate IVPB 2 g 50 mL (0 g Intravenous Stopped 07/03/18 1710)  aspirin chewable tablet 324 mg (324 mg Oral Given 07/03/18 1640)  heparin bolus via infusion 4,000 Units (4,000 Units Intravenous Bolus from Bag 07/03/18 1714)     Initial Impression / Assessment and Plan / ED Course  I have reviewed the triage vital signs and the nursing notes.  Pertinent labs & imaging results that were available during my care of the patient were reviewed by me and considered in my medical decision making (see chart for details).     62 year old male with a history of hypertension, hyperlipidemia, smoking, COPD, peripheral arterial disease, alcohol abuse, and generalized anxiety disorder, major depressive disorder presents with concern for dizziness and decreased appetite.  Patient hypotensive on arrival to the emergency department with blood pressures 50 systolic, heart rate in the 60s-70s, normal oxygenation on room air.  Differential diagnosis for hypertension includes cardiogenic, medication ingestion, sepsis, GI bleed, hypothyroidism, dehydration.  Patient reports low p.o. intake, however do not suspect soley dehydration as etiology of his hypotension given  significant hypotension and absence of tachycardia.  Overall, have low suspicion for sepsis, however have given patient empiric antibiotics given this possibility, and ordered urinalysis and blood cultures.  Patient with dark stool on exam, however is brown in color, does not appear to be melena, no sign of hematochezia, denies history of black or bloody stools or diarrhea at  home--his hemoglobin  is 15 here, and given duration of patient's fatigue without other symptoms of GI bleed, have low suspicion that his hypotension is secondary to GI bleed.  Ordered labs to evaluate for coingestions, although patient denies taking his medications improperly and denies overdose or SI.    EKG shows new TW inversions, borderline ST segments. Called STEMI physician on call to evaluate ECG. Dr. Percival Spanish came to bedside and evaluated the patient. Troponin is 5.  Feel patient with NSTEMI, however given multiple other abnormalities and history not straight forward, recommend admission to medicine/[facility] at [facility].  Bedside echo shows global hypokinesis.  Ordered aspirin and heparin gtt.  Do not feel hemoccult positive stool is consistent with acute or severe GI bleed given reasoning above.    Blood pressures 07P systolic improved with both IV fluids and levophed. Giving peripheral pressors at this time in ED given low dose, hopes for improvement with continued hydration.  Patient also with significant new hyponatremia, as well as hypokalemia.       Discussed with ICU physician.  Will transfer to [facility] for ICU admission with Cardiology consultation for NSTEMI, hypotension, possible cardiac versus dehydration versus medication related in setting of suspected cirrhosis.   Final Clinical Impressions(s) / ED Diagnoses   Final diagnoses:  Syncope and collapse  Hypotension, unspecified hypotension type  NSTEMI (non-ST elevated myocardial infarction) ([place])  Lactic acidosis  Hyponatremia    ED Discharge Orders    None       Gareth Morgan, MD 07/03/18 [phone]

## 2018-07-03 NOTE — ED Notes (Signed)
Possible stemi. Secret[facility] alerted to call [facility].

## 2018-07-03 NOTE — Consult Note (Signed)
CARDIOLOGY CONSULT NOTE  Patient ID: Oscar Phillips MRN: [id number] DOB/AGE: 1956/02/03 62 y.o.  Admit date: 07/03/2018 Primary Physician Janith Lima, MD Primary Cardiologist New Chief Complaint  Dizziness Requesting  Dr. Billy Fischer  HPI:  Patient has no past cardiac history other than negative stress perfusion study in 2016 with a small fixed inferior lateral defect with possible infarct versus attenuation.  His EF was normal at that time.  He is had 5 days of anorexia which is his biggest concern.  Today he was very dizzy and lost his equilibrium.  He is afraid of falling in the shower.  He presented to his primary care office though I do not see a note.  He is now in the emergency room.  We were initially called for STEMI.  He has chronic right bundle branch block and some new T wave inversion in the inferior leads.  There is no diagnostic ST elevation.  He does have a troponin of 5.  He also has its other abnormalities to include hyponatremia and new acute renal insufficiency.  He is guaiac positive though initial labs he is not anemic.  He reports some chest tightness to the emergency room physician but he denies this to me.  He says he is having some right-sided discomfort that is mild.  He says he has some chronic discomfort because of his chronic smoker's cough which produces phlegm.  He denies any fevers or chills.  He has not had any nausea or vomiting.  He is has some occasional small amounts of diarrhea following a stool but has not noticed any blood in his bowel movements.  He has not had any syncope.  Is not any palpitations.  He is not describing jaw or arm discomfort.   Of note we did bedside echo but the images were suboptimal.  He probably has some lung disease.  He does appear to have a slightly globally reduced ejection fraction although I could not again with clear images.  The patient does report that he drinks M[employer] apple cider.  He is doing this to try to reduce  his drinking which previously was at least a sixpack of beer nightly.  Past Medical History:  Diagnosis Date  . Abdominal bruit   . Alcoholism ([place])   . Anxiety   . B12 deficiency   . Broken arm    left arm/has metal splint/06/16/15  . Bronchitis   . COPD (chronic obstructive pulmonary disease) ([place])   . GERD (gastroesophageal reflux disease)   . H/O trichomonal urethritis   . History of panic attacks   . History of rectal bleeding    in August 2016  . HLD (hyperlipidemia)   . HTN (hypertension)   . Myocardial infarction ([place])    Pt unsure/no symptoms in 05/2015  . OCD (obsessive compulsive disorder)   . PSA elevation     Past Surgical History:  Procedure Laterality Date  . ELBOW FRACTURE SURGERY Left   . LUMBAR DISC SURGERY    . TONSILLECTOMY  1965    Allergies  Allergen Reactions  . Penicillins     Has patient had a PCN reaction causing immediate rash, facial/tongue/throat swelling, SOB or lightheadedness with hypotension: Yes Has patient had a PCN reaction causing severe rash involving mucus membranes or skin necrosis: No Has patient had a PCN reaction that required hospitalization: Unknown Has patient had a PCN reaction occurring within the last 10 years: Unknown If all of the above answers are "NO", then  may proceed with Cephalosporin use.    Prior to Admission medications   Medication Sig Start Date End Date Taking? Authorizing Provider  albuterol (PROVENTIL HFA;VENTOLIN HFA) 108 (90 Base) MCG/ACT inhaler Inhale 2 puffs into the lungs every 6 (six) hours as needed for wheezing or shortness of breath. 03/03/18   Shelly Coss, MD  amLODipine (NORVASC) 10 MG tablet TK 1 T PO QD 05/14/18   [provider]  ARIPiprazole (ABILIFY) 5 MG tablet Take 1 tablet (5 mg total) by mouth daily. 04/05/18   Mordecai Maes, NP  dexlansoprazole (DEXILANT) 60 MG capsule Take 1 capsule (60 mg total) by mouth daily. 04/17/18   Janith Lima, MD  folic acid (FOLVITE) 1 MG  tablet Take 1 mg by mouth daily.    [provider]  gabapentin (NEURONTIN) 100 MG capsule Take 2 capsules (200 mg total) by mouth 3 (three) times daily. 04/04/18   Mordecai Maes, NP  irbesartan (AVAPRO) 150 MG tablet Take 1 tablet (150 mg total) by mouth daily. 04/05/18   Mordecai Maes, NP  mirtazapine (REMERON) 7.5 MG tablet Take 1 tablet (7.5 mg total) by mouth at bedtime. 04/04/18   Mordecai Maes, NP  thiamine 100 MG tablet Take 1 tablet (100 mg total) by mouth daily. 03/04/18   Shelly Coss, MD  Tiotropium Bromide Monohydrate (SPIRIVA RESPIMAT) 2.5 MCG/ACT AERS Inhale 2 puffs into the lungs daily. 04/17/18   Janith Lima, MD     (Not in a hospital admission) Family History  Problem Relation Age of Onset  . Hyperlipidemia Father   . Colon cancer Father   . Stomach cancer Father   . Dementia Mother   . Alcohol abuse Maternal Uncle   . Lung cancer Maternal Uncle   . Heart failure Paternal Grandmother   . Emphysema Paternal Grandmother   . Diabetes Neg Hx   . Drug abuse Neg Hx   . Early death Neg Hx   . Heart disease Neg Hx   . Hypertension Neg Hx   . Kidney disease Neg Hx   . Stroke Neg Hx     Social History   Socioeconomic History  . Marital status: Single    Spouse name: Not on file  . Number of children: 0  . Years of education: Not on file  . Highest education level: Not on file  Occupational History  . Occupation: accounting assist    Comment: [employer]  Social Needs  . Financial resource strain: Not on file  . Food insecurity:    Worry: Not on file    Inability: Not on file  . Transportation needs:    Medical: Not on file    Non-medical: Not on file  Tobacco Use  . Smoking status: Current Every Day Smoker    Packs/day: 1.00    Years: 25.00    Pack years: 25.00    Types: Cigarettes  . Smokeless tobacco: Never Used  Substance and Sexual Activity  . Alcohol use: Yes    Alcohol/week: 15.0 standard drinks    Types: 15 Glasses of wine  per week  . Drug use: No  . Sexual activity: Not Currently  Lifestyle  . Physical activity:    Days per week: Not on file    Minutes per session: Not on file  . Stress: Not on file  Relationships  . Social connections:    Talks on phone: Not on file    Gets together: Not on file    Attends religious service:  Not on file    Active member of club or organization: Not on file    Attends meetings of clubs or organizations: Not on file    Relationship status: Not on file  . Intimate partner violence:    Fear of current or ex partner: Not on file    Emotionally abused: Not on file    Physically abused: Not on file    Forced sexual activity: Not on file  Other Topics Concern  . Not on file  Social History Narrative  . Not on file     ROS:    As stated in the HPI and negative for all other systems.  Physical Exam: Blood pressure (!) 94/57, pulse 87, temperature 97.7 F (36.5 C), temperature source Oral, resp. rate 14, SpO2 100 %.  GENERAL:  Well appearing HEENT:  Pupils equal round and reactive, fundi not visualized, oral mucosa unremarkable NECK:  No jugular venous distention, waveform within normal limits, carotid upstroke brisk and symmetric, no bruits, no thyromegaly LYMPHATICS:  No cervical, inguinal adenopathy LUNGS:  Clear to auscultation bilaterally BACK:  No CVA tenderness CHEST:  Unremarkable HEART:  PMI not displaced or sustained,S1 and S2 within normal limits, no S3, no S4, no clicks, no rubs, no murmurs ABD:  Flat, positive bowel sounds normal in frequency in pitch, no bruits, no rebound, no guarding, no midline pulsatile mass, no hepatomegaly, no splenomegaly EXT:  2 plus pulses throughout, no edema, no cyanosis no clubbing SKIN:  No rashes no nodules NEURO:  Cranial nerves II through XII grossly intact, motor grossly intact throughout PSYCH:  Cognitively intact, oriented to person place and time  Labs: Lab Results  Component Value Date   BUN 8 07/03/2018    Lab Results  Component Value Date   CREATININE 1.70 (H) 07/03/2018   Lab Results  Component Value Date   NA 123 (L) 07/03/2018   K 2.9 (L) 07/03/2018   CL 86 (L) 07/03/2018   CO2 32 04/03/2018   No results found for: TROPONINI Lab Results  Component Value Date   WBC 10.0 04/03/2018   HGB 15.6 07/03/2018   HCT 46.0 07/03/2018   MCV 94.0 04/03/2018   PLT 327 04/03/2018   Lab Results  Component Value Date   CHOL 162 03/02/2018   HDL 83 03/02/2018   LDLCALC 56 03/02/2018   LDLDIRECT 154.1 06/03/2010   TRIG 115 03/02/2018   CHOLHDL 2.0 03/02/2018   Lab Results  Component Value Date   ALT 74 (H) 03/19/2018   AST 58 (H) 03/19/2018   ALKPHOS 58 03/19/2018   BILITOT 1.2 03/19/2018    Radiology:   CXR: IMPRESSION: No active disease.   EKG:   Right bundle branch block, left axis deviation, less than 1/2 mm ST elevation V1 through V3, T wave inversions in the inferior leads new from previous.  07/03/2018  ASSESSMENT AND PLAN:   NSTEMI: The patient's presentation is 1 predominantly of anorexia along with hypertension.  There is evidence of volume depletion with his acute renal insufficiency.  He clearly has elevated enzymes but is not having any chest pain symptoms consistent with an acute coronary.  At this point I am not planning to take to the Cath Lab this evening but I would like to get an echo soon as possible.  We will follow serial enzymes.  If his hemoglobin comes back normal I have suggested heparin and aspirin.  His blood pressure which was low on addition is much improved with hydration  and a low dose of levo fed which I think could be weaned.  Certainly if he shows any instability this evening we can take him to the Cath Lab but would like further evaluation including full labs to include a hemoglobin serial enzymes follow-up creatinine repeat EKG and the echo as mentioned.  HYPOTENSION:  Improved as above.  He is oxygenating well and demonstrating normal organ  perfusion.  Continue supportive care.  SignedMinus Breeding 07/03/2018, 3:37 PM

## 2018-07-03 NOTE — ED Notes (Signed)
Echo is back at bedside.

## 2018-07-03 NOTE — ED Notes (Signed)
Echo had to be paused due to patient had to have a bowel movement and urinate.

## 2018-07-03 NOTE — ED Notes (Signed)
Patient had a bowel movement and urinated  in the bedpan. Provided patient hygiene with changing sheet, incontinence pad, and diaper. Provided warm blankets. Hassell Done, NT assisted.

## 2018-07-03 NOTE — Progress Notes (Signed)
Oscar Phillips is a 62 y.o. male with the following history as recorded in EpicCare:  Patient Active Problem List   Diagnosis Date Noted  . Esophageal dysphagia 04/17/2018  . Elevated LFTs 04/17/2018  . PAD (peripheral artery disease) ([place]) 04/17/2018  . 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 GAD (generalized anxiety disorder)   . MDD (major depressive disorder), recurrent episode, severe ([place]) 04/01/2018  . Chronic alcohol abuse 03/02/2018  . Colon cancer screening 05/24/2016  . PSA elevation 08/23/2015  . Cough 05/20/2015  . Routine general medical examination at a health care facility 05/20/2015  . Essential hypertension 05/20/2015  . 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 exacerbation ([place]) 05/20/2015  . DOE (dyspnea on exertion) 05/20/2015  . Abnormal electrocardiogram (ECG) (EKG) 05/20/2015  . Hyperlipidemia with target LDL less than 100 05/20/2015  . B12 deficiency 09/21/2010  . Irritable bowel syndrome 09/20/2010  . TOBACCO USE 06/03/2010  . DEPRESSION 06/03/2010  . GERD 06/03/2010    No current facility-administered medications for this visit.    Current Outpatient Medications  Medication Sig Dispense Refill  . albuterol (PROVENTIL HFA;VENTOLIN HFA) 108 (90 Base) MCG/ACT inhaler Inhale 2 puffs into the lungs every 6 (six) hours as needed for wheezing or shortness of breath. 1 Inhaler 2  . amLODipine (NORVASC) 10 MG tablet TK 1 T PO QD  1  . ARIPiprazole (ABILIFY) 5 MG tablet Take 1 tablet (5 mg total) by mouth daily. 30 tablet 1  . dexlansoprazole (DEXILANT) 60 MG capsule Take 1 capsule (60 mg total) by mouth daily. 90 capsule 1  . folic acid (FOLVITE) 1 MG tablet Take 1 mg by mouth daily.    Marland Kitchen gabapentin (NEURONTIN) 100 MG capsule Take 2 capsules (200 mg total) by mouth 3 (three) times daily. 180 capsule 1  . irbesartan (AVAPRO) 150 MG tablet Take 1 tablet (150 mg total) by mouth daily. 30 tablet 1  . mirtazapine  (REMERON) 7.5 MG tablet Take 1 tablet (7.5 mg total) by mouth at bedtime. 30 tablet 1  . thiamine 100 MG tablet Take 1 tablet (100 mg total) by mouth daily. 30 tablet 0  . Tiotropium Bromide Monohydrate (SPIRIVA RESPIMAT) 2.5 MCG/ACT AERS Inhale 2 puffs into the lungs daily. 12 g 1   Facility-Administered Medications Ordered in Other Visits  Medication Dose Route Frequency Provider Last Rate Last Dose  . heparin ADULT infusion 100 units/mL (25000 units/254mL sodium chloride 0.45%)  800 Units/hr Intravenous Continuous Napoleon Form, RPH      . heparin bolus via infusion 4,000 Units  4,000 Units Intravenous Once Christy, Scott D, RPH      . ipratropium-albuterol (DUONEB) 0.5-2.5 (3) MG/3ML nebulizer solution 3 mL  3 mL Nebulization Q6H PRN Olalere, Adewale A, MD      . potassium chloride 10 mEq in 100 mL IVPB  10 mEq Intravenous Q1 Hr x 2 Schlossman, Erin, MD      . sodium chloride 0.9 % bolus 1,000 mL  1,000 mL Intravenous Once Gareth Morgan, MD      . sodium chloride 0.9 % bolus 2,000 mL  2,000 mL Intravenous Once Gareth Morgan, MD 500 mL/hr at 07/03/18 1625 2,000 mL at 07/03/18 1625  . vancomycin (VANCOCIN) 1,250 mg in sodium chloride 0.9 % 250 mL IVPB  1,250 mg Intravenous Once Napoleon Form, RPH 166.7 mL/hr at 07/03/18 1620 1,250 mg at 07/03/18 1620    Allergies: Penicillins  Past Medical History:  Diagnosis Date  . Abdominal bruit   .  Alcoholism ([place])   . Anxiety   . B12 deficiency   . Bronchitis   . COPD (chronic obstructive pulmonary disease) ([place])   . GERD (gastroesophageal reflux disease)   . H/O trichomonal urethritis   . History of panic attacks   . History of rectal bleeding    in August 2016  . HLD (hyperlipidemia)   . HTN (hypertension)   . OCD (obsessive compulsive disorder)   . PSA elevation     Past Surgical History:  Procedure Laterality Date  . ELBOW FRACTURE SURGERY Left   . LUMBAR DISC SURGERY    . TONSILLECTOMY  1965    Family History  Problem  Relation Age of Onset  . Hyperlipidemia Father   . Colon cancer Father   . Stomach cancer Father   . Dementia Mother   . Alcohol abuse Maternal Uncle   . Lung cancer Maternal Uncle   . Heart failure Paternal Grandmother   . Emphysema Paternal Grandmother   . Diabetes Neg Hx   . Drug abuse Neg Hx   . Early death Neg Hx   . Heart disease Neg Hx   . Hypertension Neg Hx   . Kidney disease Neg Hx   . Stroke Neg Hx     Social History   Tobacco Use  . Smoking status: Current Every Day Smoker    Packs/day: 1.00    Years: 25.00    Pack years: 25.00    Types: Cigarettes  . Smokeless tobacco: Never Used  Substance Use Topics  . Alcohol use: Yes    Alcohol/week: 15.0 standard drinks    Types: 15 Glasses of wine per week    Subjective:  Patient scheduled his appointment with concerns of weakness/ decreased appetite. Upon bringing the patient back to the room, CMA immediately called for help due to concerns that patient was "going in and out" while giving history. Patient was obviously weak, pale and I was concerned for him passing out; asked my CMA to immediately call 911; had a second MA help me move the patient to the floor and had him lie down with feet elevated; Attempted to obtain a history from patient while he was on the floor- known history of alcoholism but patient denies using alcohol; stated he had not eaten in 5 days and has only been drinking Coca-Cola; speech is slurred and on 2 separate occasions patient seems to lose consciousness for about 2 seconds.   Objective:  There were no vitals filed for this visit.  General: Well developed, well nourished, in moderate distress  Skin : Warm; pale;  Head: Normocephalic and atraumatic  Lungs: Respirations unlabored;  Neurologic: Slurred speech, aware that is in the doctor's office and notes he drove himself to the office; spastic body movements noted; Very limited exam able to be performed as I was holding patient's legs stable on  the floor waiting on EMS  Assessment:  1. Weakness   2. Slurred speech     Plan:  With history of alcoholism and patient's presentation, am concerned for alcohol poisoning/ severe hyponatremia/ ? Stroke; Transported by ambulance to [facility];   No follow-ups on file.  No orders of the defined types were placed in this encounter.   Requested Prescriptions    No prescriptions requested or ordered in this encounter

## 2018-07-03 NOTE — ED Notes (Signed)
Ed provider notified patient has critical Troponin value of 5.36

## 2018-07-03 NOTE — Progress Notes (Signed)
ANTICOAGULATION CONSULT NOTE - Initial Consult  Pharmacy Consult for Heparin Indication: chest pain/ACS  Allergies  Allergen Reactions  . Penicillins     Has patient had a PCN reaction causing immediate rash, facial/tongue/throat swelling, SOB or lightheadedness with hypotension: Yes Has patient had a PCN reaction causing severe rash involving mucus membranes or skin necrosis: No Has patient had a PCN reaction that required hospitalization: Unknown Has patient had a PCN reaction occurring within the last 10 years: Unknown If all of the above answers are "NO", then may proceed with Cephalosporin use.     Patient Measurements: Height: 5\' 9"  (175.3 cm) Weight: 150 lb (68 kg) IBW/kg (Calculated) : 70.7 Heparin Dosing Weight: 68 kg  Vital Signs: Temp: 97.7 F (36.5 C) (09/25 1529) Temp Source: Oral (09/25 1529) BP: 88/56 (09/25 1622) Pulse Rate: 87 (09/25 1622)  Labs: Recent Labs    07/03/18 1502 07/03/18 1506  HGB 15.6 15.3  HCT 46.0 43.7  PLT  --  216  CREATININE 1.70* 1.65*    Estimated Creatinine Clearance: 44.6 mL/min (A) (by C-G formula based on SCr of 1.65 mg/dL (H)).   Medical History: Past Medical History:  Diagnosis Date  . Abdominal bruit   . Alcoholism ([place])   . Anxiety   . B12 deficiency   . Bronchitis   . COPD (chronic obstructive pulmonary disease) ([place])   . GERD (gastroesophageal reflux disease)   . H/O trichomonal urethritis   . History of panic attacks   . History of rectal bleeding    in August 2016  . HLD (hyperlipidemia)   . HTN (hypertension)   . OCD (obsessive compulsive disorder)   . PSA elevation     Medications:   Assessment: 62 y/o M with a 5 day h/o anorexia presented to PCP due to syncope. Troponin is 5. Patient is being evaluated with NSTEMI with orders to initiate heparin infusion. Guaiac-positive stool per cardiology note but stable hemoglobin. Unable to contact RN for h/o anticoagulation/blood thinners but chart review does  not indicate a history of such.   Goal of Therapy:  Heparin level 0.3-0.7 units/ml Monitor platelets by anticoagulation protocol: Yes   Plan:  Give 4000 units bolus x 1 Start heparin infusion at 800 units/hr Check anti-Xa level in 6 hours and daily while on heparin Continue to monitor H&H and platelets  Ulice Dash D 07/03/2018,4:54 PM

## 2018-07-03 NOTE — Progress Notes (Signed)
  Echocardiogram 2D Echocardiogram has been performed.  Oscar Phillips G Oscar Phillips 07/03/2018, 7:08 PM

## 2018-07-03 NOTE — Progress Notes (Signed)
[place] Progress Note Patient Name: Oscar Phillips DOB: 02/07/1956 MRN: [id number]   Date of Service  07/03/2018  HPI/Events of Note  Nicotine withdrawal, patient is hungry-per Cardiology he can have a meal tonight and be NPO following midnight for AM cath.  eICU Interventions  Nicotrol 14 mg patch ordered. Cardiac diet tonight then npo following midnight for AM cath.        Kerry Kass Jaydin Jalomo 07/03/2018, 9:57 PM

## 2018-07-03 NOTE — ED Notes (Signed)
Patient is resting quietly, intermittent talking to staff.

## 2018-07-03 NOTE — ED Notes (Signed)
Manual Blood pressure taken by this RN. 50/30. MD made aware.

## 2018-07-03 NOTE — ED Notes (Signed)
Echo at bedside

## 2018-07-03 NOTE — ED Notes (Signed)
Patient confused and disorganized thoughts. Patient denies ingestion other than his prescribed medication but can not state what.

## 2018-07-03 NOTE — ED Notes (Addendum)
[facility] requested to be canceled by MD.

## 2018-07-03 NOTE — ED Triage Notes (Addendum)
Per [facility]- At PCP Witnessed syncopal episode lasting 15 seconds. Hypotensive Systolic 78'N. STROKE NEG. Denies chest pain, shortness of breath N/V/D and fever. Seen at PCP for generalized weakness and gait issues. C/o loss of appetite for several days.

## 2018-07-04 ENCOUNTER — Inpatient Hospital Stay (HOSPITAL_COMMUNITY): Payer: BLUE CROSS/BLUE SHIELD

## 2018-07-04 ENCOUNTER — Encounter (HOSPITAL_COMMUNITY)
Admission: EM | Disposition: A | Discharge status: Home / Self Care | Payer: Self-pay | Source: Home / Self Care | Attending: Pulmonary Disease

## 2018-07-04 DIAGNOSIS — R55 Syncope and collapse: Secondary | ICD-10-CM

## 2018-07-04 DIAGNOSIS — E872 Acidosis, unspecified: Secondary | ICD-10-CM

## 2018-07-04 DIAGNOSIS — I2510 Atherosclerotic heart disease of native coronary artery without angina pectoris: Secondary | ICD-10-CM

## 2018-07-04 DIAGNOSIS — I214 Non-ST elevation (NSTEMI) myocardial infarction: Secondary | ICD-10-CM

## 2018-07-04 HISTORY — PX: LEFT HEART CATH AND CORONARY ANGIOGRAPHY: CATH118249

## 2018-07-04 LAB — TROPONIN I
Troponin I: 6.47 ng/mL (ref <0.03)
Troponin I: 4.63 ng/mL (ref <0.03)

## 2018-07-04 LAB — BASIC METABOLIC PANEL
Anion gap: 7 (ref 5–15)
BUN: 8 mg/dL (ref 8–23)
CHLORIDE: 105 mmol/L (ref 98–111)
CO2: 21 mmol/L — ABNORMAL LOW (ref 22–32)
Calcium: 7.6 mg/dL — ABNORMAL LOW (ref 8.9–10.3)
Creatinine, Ser: 0.88 mg/dL (ref 0.61–1.24)
GFR calc Af Amer: >60 mL/min (ref >60)
GFR calc non Af Amer: >60 mL/min (ref >60)
GLUCOSE: 116 mg/dL — AB (ref 70–99)
POTASSIUM: 2.9 mmol/L — AB (ref 3.5–5.1)
Sodium: 133 mmol/L — ABNORMAL LOW (ref 135–145)

## 2018-07-04 LAB — ABO/RH: ABO/RH(D): A POS

## 2018-07-04 LAB — CBC
HEMATOCRIT: 41.2 % (ref 39.0–52.0)
HEMOGLOBIN: 14 g/dL (ref 13.0–17.0)
MCH: 32 pg (ref 26.0–34.0)
MCHC: 34 g/dL (ref 30.0–36.0)
MCV: 94.3 fL (ref 78.0–100.0)
Platelets: 160 10*3/uL (ref 150–400)
RBC: 4.37 MIL/uL (ref 4.22–5.81)
RDW: 12.8 % (ref 11.5–15.5)
WBC: 11.9 10*3/uL — AB (ref 4.0–10.5)

## 2018-07-04 LAB — POTASSIUM: POTASSIUM: 3.2 mmol/L — AB (ref 3.5–5.1)

## 2018-07-04 LAB — HEPARIN LEVEL (UNFRACTIONATED)
Heparin Unfractionated: <0.1 IU/mL — ABNORMAL LOW (ref 0.30–0.70)
Heparin Unfractionated: 0.14 IU/mL — ABNORMAL LOW (ref 0.30–0.70)

## 2018-07-04 LAB — HIGH SENSITIVITY CRP: CRP HIGH SENSITIVITY: 18 mg/L — AB (ref 0.00–3.00)

## 2018-07-04 LAB — MRSA PCR SCREENING: MRSA by PCR: NEGATIVE

## 2018-07-04 SURGERY — LEFT HEART CATH AND CORONARY ANGIOGRAPHY
Anesthesia: LOCAL

## 2018-07-04 MED ORDER — CARVEDILOL 3.125 MG PO TABS: 3.1250 mg | ORAL_TABLET | Freq: Two times a day (BID) | ORAL | Status: DC

## 2018-07-04 MED ORDER — ACETAMINOPHEN 325 MG PO TABS: 650.0000 mg | ORAL_TABLET | ORAL | Status: DC | PRN

## 2018-07-04 MED ORDER — ONDANSETRON HCL 4 MG/2ML IJ SOLN: 4.0000 mg | Freq: Four times a day (QID) | INTRAMUSCULAR | Status: DC | PRN

## 2018-07-04 MED ORDER — POTASSIUM CHLORIDE CRYS ER 20 MEQ PO TBCR
40.0000 meq | EXTENDED_RELEASE_TABLET | Freq: Two times a day (BID) | ORAL | Status: AC
  Administered 2018-07-04 (×2): 40 meq via ORAL
  Filled 2018-07-04 (×2): qty 2

## 2018-07-04 MED ORDER — SODIUM CHLORIDE 0.9 % IV SOLN: INTRAVENOUS | Status: AC

## 2018-07-04 MED ORDER — POTASSIUM CHLORIDE 10 MEQ/100ML IV SOLN
INTRAVENOUS | Status: AC
  Administered 2018-07-04: 10 meq via INTRAVENOUS
  Filled 2018-07-04: qty 100

## 2018-07-04 MED ORDER — METOPROLOL SUCCINATE ER 25 MG PO TB24
25.0000 mg | ORAL_TABLET | Freq: Every day | ORAL | Status: DC
  Administered 2018-07-04 – 2018-07-05 (×2): 25 mg via ORAL
  Filled 2018-07-04 (×3): qty 1

## 2018-07-04 MED ORDER — SODIUM CHLORIDE 0.9 % WEIGHT BASED INFUSION
1.0000 mL/kg/h | INTRAVENOUS | Status: DC
  Administered 2018-07-04: 1 mL/kg/h via INTRAVENOUS

## 2018-07-04 MED ORDER — FOLIC ACID 1 MG PO TABS
1.0000 mg | ORAL_TABLET | Freq: Every day | ORAL | Status: DC
  Administered 2018-07-04 – 2018-07-09 (×6): 1 mg via ORAL
  Filled 2018-07-04 (×6): qty 1

## 2018-07-04 MED ORDER — VITAMIN B-1 100 MG PO TABS
100.0000 mg | ORAL_TABLET | Freq: Every day | ORAL | Status: DC
  Administered 2018-07-04: 100 mg via ORAL
  Filled 2018-07-04: qty 1

## 2018-07-04 MED ORDER — POTASSIUM CHLORIDE CRYS ER 20 MEQ PO TBCR: 40.0000 meq | EXTENDED_RELEASE_TABLET | Freq: Once | ORAL | Status: DC

## 2018-07-04 MED ORDER — IOHEXOL 350 MG/ML SOLN
INTRAVENOUS | Status: DC | PRN
  Administered 2018-07-04: 70 mL via INTRA_ARTERIAL

## 2018-07-04 MED ORDER — POTASSIUM CHLORIDE CRYS ER 20 MEQ PO TBCR: 20.0000 meq | EXTENDED_RELEASE_TABLET | Freq: Once | ORAL | Status: DC

## 2018-07-04 MED ORDER — SODIUM CHLORIDE 0.9% FLUSH
3.0000 mL | Freq: Two times a day (BID) | INTRAVENOUS | Status: DC
  Administered 2018-07-05 – 2018-07-09 (×8): 3 mL via INTRAVENOUS

## 2018-07-04 MED ORDER — LIDOCAINE HCL (PF) 1 % IJ SOLN
INTRAMUSCULAR | Status: AC
  Filled 2018-07-04: qty 30

## 2018-07-04 MED ORDER — ADULT MULTIVITAMIN W/MINERALS CH
1.0000 | ORAL_TABLET | Freq: Every day | ORAL | Status: DC
  Administered 2018-07-04 – 2018-07-09 (×6): 1 via ORAL
  Filled 2018-07-04 (×6): qty 1

## 2018-07-04 MED ORDER — HEPARIN (PORCINE) IN NACL 1000-0.9 UT/500ML-% IV SOLN
INTRAVENOUS | Status: AC
  Filled 2018-07-04: qty 1000

## 2018-07-04 MED ORDER — VERAPAMIL HCL 2.5 MG/ML IV SOLN
INTRAVENOUS | Status: DC | PRN
  Administered 2018-07-04: 10 mL via INTRA_ARTERIAL

## 2018-07-04 MED ORDER — POTASSIUM CHLORIDE 10 MEQ/100ML IV SOLN
10.0000 meq | INTRAVENOUS | Status: AC
  Administered 2018-07-04 (×2): 10 meq via INTRAVENOUS
  Filled 2018-07-04: qty 100

## 2018-07-04 MED ORDER — ATORVASTATIN CALCIUM 80 MG PO TABS
80.0000 mg | ORAL_TABLET | Freq: Every day | ORAL | Status: DC
  Administered 2018-07-04 – 2018-07-08 (×5): 80 mg via ORAL
  Filled 2018-07-04 (×5): qty 1

## 2018-07-04 MED ORDER — LISINOPRIL 5 MG PO TABS
5.0000 mg | ORAL_TABLET | Freq: Every day | ORAL | Status: DC
  Administered 2018-07-05: 5 mg via ORAL
  Filled 2018-07-04 (×2): qty 1

## 2018-07-04 MED ORDER — SODIUM CHLORIDE 0.9 % WEIGHT BASED INFUSION: 3.0000 mL/kg/h | INTRAVENOUS | Status: DC

## 2018-07-04 MED ORDER — SODIUM CHLORIDE 0.9 % IV SOLN
INTRAVENOUS | Status: DC | PRN
  Administered 2018-07-04: 10 mL/h via INTRAVENOUS

## 2018-07-04 MED ORDER — POTASSIUM CHLORIDE 10 MEQ/100ML IV SOLN
10.0000 meq | INTRAVENOUS | Status: AC
  Administered 2018-07-04 (×2): 10 meq via INTRAVENOUS
  Filled 2018-07-04 (×2): qty 100

## 2018-07-04 MED ORDER — SODIUM CHLORIDE 0.9% FLUSH
3.0000 mL | Freq: Two times a day (BID) | INTRAVENOUS | Status: DC
  Administered 2018-07-04: 3 mL via INTRAVENOUS

## 2018-07-04 MED ORDER — LORAZEPAM 2 MG/ML IJ SOLN
2.0000 mg | INTRAMUSCULAR | Status: DC | PRN
  Administered 2018-07-04: 3 mg via INTRAVENOUS
  Administered 2018-07-05 (×2): 2 mg via INTRAVENOUS
  Administered 2018-07-05: 3 mg via INTRAVENOUS
  Administered 2018-07-05: 2 mg via INTRAVENOUS
  Filled 2018-07-04: qty 1
  Filled 2018-07-04: qty 2
  Filled 2018-07-04 (×2): qty 1
  Filled 2018-07-04: qty 2

## 2018-07-04 MED ORDER — SODIUM CHLORIDE 0.9% FLUSH: 3.0000 mL | INTRAVENOUS | Status: DC | PRN

## 2018-07-04 MED ORDER — HEPARIN (PORCINE) IN NACL 1000-0.9 UT/500ML-% IV SOLN
INTRAVENOUS | Status: DC | PRN
  Administered 2018-07-04 (×3): 500 mL

## 2018-07-04 MED ORDER — SODIUM CHLORIDE 0.9 % IV SOLN: 250.0000 mL | INTRAVENOUS | Status: DC | PRN

## 2018-07-04 MED ORDER — HEPARIN (PORCINE) IN NACL 1000-0.9 UT/500ML-% IV SOLN
INTRAVENOUS | Status: AC
  Filled 2018-07-04: qty 500

## 2018-07-04 MED ORDER — CHLORHEXIDINE GLUCONATE 0.12 % MT SOLN
15.0000 mL | Freq: Two times a day (BID) | OROMUCOSAL | Status: DC
  Administered 2018-07-04 – 2018-07-09 (×9): 15 mL via OROMUCOSAL
  Filled 2018-07-04 (×9): qty 15

## 2018-07-04 MED ORDER — TIOTROPIUM BROMIDE MONOHYDRATE 18 MCG IN CAPS
1.0000 | ORAL_CAPSULE | Freq: Every day | RESPIRATORY_TRACT | Status: DC
  Administered 2018-07-04 – 2018-07-05 (×2): 18 ug via RESPIRATORY_TRACT
  Filled 2018-07-04: qty 5

## 2018-07-04 MED ORDER — HEPARIN SODIUM (PORCINE) 1000 UNIT/ML IJ SOLN
INTRAMUSCULAR | Status: DC | PRN
  Administered 2018-07-04: 3000 [IU] via INTRAVENOUS

## 2018-07-04 MED ORDER — LIDOCAINE HCL (PF) 1 % IJ SOLN
INTRAMUSCULAR | Status: DC | PRN
  Administered 2018-07-04: 2 mL via INTRADERMAL

## 2018-07-04 MED ORDER — VERAPAMIL HCL 2.5 MG/ML IV SOLN
INTRAVENOUS | Status: AC
  Filled 2018-07-04: qty 2

## 2018-07-04 MED ORDER — VITAMIN B-1 100 MG PO TABS
100.0000 mg | ORAL_TABLET | Freq: Every day | ORAL | Status: DC
  Administered 2018-07-04 – 2018-07-09 (×6): 100 mg via ORAL
  Filled 2018-07-04 (×6): qty 1

## 2018-07-04 MED ORDER — SODIUM CHLORIDE 0.9 % IV BOLUS: 1000.0000 mL | Freq: Once | INTRAVENOUS | Status: DC

## 2018-07-04 MED ORDER — ORAL CARE MOUTH RINSE
15.0000 mL | Freq: Two times a day (BID) | OROMUCOSAL | Status: DC
  Administered 2018-07-05 – 2018-07-08 (×6): 15 mL via OROMUCOSAL

## 2018-07-04 MED ORDER — ASPIRIN 81 MG PO CHEW
81.0000 mg | CHEWABLE_TABLET | ORAL | Status: AC
  Administered 2018-07-04: 81 mg via ORAL

## 2018-07-04 MED ORDER — HEPARIN SODIUM (PORCINE) 5000 UNIT/ML IJ SOLN
5000.0000 [IU] | Freq: Three times a day (TID) | INTRAMUSCULAR | Status: DC
  Administered 2018-07-04 – 2018-07-09 (×14): 5000 [IU] via SUBCUTANEOUS
  Filled 2018-07-04 (×14): qty 1

## 2018-07-04 SURGICAL SUPPLY — 11 items
CATH INFINITI 5 FR JL3.5 (CATHETERS) ×2 IMPLANT
CATH INFINITI JR4 5F (CATHETERS) ×2 IMPLANT
DEVICE RAD COMP TR BAND LRG (VASCULAR PRODUCTS) ×2 IMPLANT
GLIDESHEATH SLEND A-KIT 6F 22G (SHEATH) ×2 IMPLANT
HOVERMATT SINGLE USE (MISCELLANEOUS) ×2 IMPLANT
KIT HEART LEFT (KITS) ×2 IMPLANT
PACK CARDIAC CATHETERIZATION (CUSTOM PROCEDURE TRAY) ×2 IMPLANT
SHEATH PROBE COVER 6X72 (BAG) ×2 IMPLANT
TRANSDUCER W/STOPCOCK (MISCELLANEOUS) ×2 IMPLANT
TUBING CIL FLEX 10 FLL-RA (TUBING) ×2 IMPLANT
WIRE EMERALD 3MM-J .035X260CM (WIRE) ×2 IMPLANT

## 2018-07-04 NOTE — Progress Notes (Signed)
ANTICOAGULATION CONSULT NOTE - Follow Up Consult  Pharmacy Consult for heparin Indication: NSTEMI  HEPARIN DW (KG): 65.3  Labs: Recent Labs    07/03/18 1502 07/03/18 1506 07/03/18 1510 07/03/18 2141 07/04/18 0220 07/04/18 0327 07/04/18 0705 07/04/18 1017  HGB 15.6 15.3  --   --   --  14.0  --   --   HCT 46.0 43.7  --   --   --  41.2  --   --   PLT  --  216  --   --   --  160  --   --   APTT  --   --  35  --   --   --   --   --   LABPROT  --   --  11.6  --   --   --   --   --   INR  --   --  0.86  --   --   --   --   --   HEPARINUNFRC  --   --   --   --  <0.10*  --   --  0.14*  CREATININE 1.70* 1.65*  --   --   --   --  0.88  --   TROPONINI  --   --   --  5.86* 6.47*  --   --   --     Assessment: Oscar Phillips is a 62yo male admitted for NSTEMI. Pharmacy consulted to manage heparin. On admit  FOB+ ; RN noted that there was blood in stool.  9/26: AM Heparin level subtherapeutic at 0.14 , will not bolus with FOB+. No infusion issues noted at this time. Hgb 14, plt 160; continue to monitor.  Goal of Therapy:  Heparin level 0.3-0.7 units/ml   Plan:  Increase heparin to 1200 units/hr 6hr heparin level Daily heparin level, CBC, monitor for s/sx of bleeding F/u plans for cath  Thank you for involving pharmacy in this patient's care.  Janae Bridgeman, PharmD PGY1 Pharmacy Resident Phone: [phone] 07/04/2018 12:09 PM

## 2018-07-04 NOTE — H&P (Signed)
NAME:  Kohner Orlick, MRN:  [id number], DOB:  [birth year], LOS: 1 ADMISSION DATE:  07/03/2018, CONSULTATION DATE:  07/03/18 REFERRING MD:  Billy Fischer, CHIEF COMPLAINT: Generalized fatigue, listlessness  Brief History   Patient with a 9-day history of generalized fatigue, listlessness Has not been eating for about 6days-generally has no appetite Symptoms started 9 days ago with diarrhea-felt he had a GI bug History of alcoholism History of COPD Recent hospitalization has been to behavioral health, alcohol-related issues   Past Medical History   He drinks daily-he claimed is a moderate drinker 11/2 to 2 packs a day smoker  Significant Hospital Events   Hypotension Behavioral health admission 04/02/2018  Consults: date of consult/date signed off & final recs:  Cardiology 07/03/2018--to be transferred to [facility]  Procedures (surgical and bedside):  Bedside echocardiogram reportedly with evidence of hypokinesis, low ejection fraction  Significant Diagnostic Tests:  X-ray 07/03/2018-no acute infiltrate Echocardiogram performed at bedside-reportedly low ejection fraction 06/01/2015 myocardial perfusion imaging did reveal ejection fraction of 63% 07/06/2015-PFT with mild obstructive disease-affecting small airways  Micro Data:  Blood cultures requested and pending at present Did receive a dose of vancomycin  Antimicrobials:  Vancomycin 07/03/2018>> Cefepime 07/03/2018>>  Subjective:  Awake Just feels listless Denies any pain or discomfort Periodic agitation.  Objective   Blood pressure 112/74, pulse 96, temperature 98.2 F (36.8 C), temperature source Oral, resp. rate (!) 22, height 5\' 9"  (1.753 m), weight 65.3 kg, SpO2 95 %.        Intake/Output Summary (Last 24 hours) at 07/04/2018 1155 Last data filed at 07/04/2018 0600 Gross per 24 hour  Intake 3061.88 ml  Output 6 ml  Net 3055.88 ml   Filed Weights   07/03/18 1634 07/03/18 2025  Weight: 68 kg 65.3 kg     Examination: General: Awake, interactive, underweight HENT: Oral mucosa and sclerae moist Lungs: + Tracheal tug and occasional wheezes bilaterally.  Cardiovascular: JVP remains flat.  No leg edema.  Apex beat difficult to palpate. S1, S2 normal with S4 heard. PP+ Abdomen: Bowel sounds appreciated Extremities: No clubbing, no edema Neuro: Alert, oriented x3, moving all extremities GU: Has not urinated yet  Bedside echocardiogram shows reduced EF with no significant valvular disease.   Resolved Hospital Problem list     Assessment & Plan:   Hypovolemic shock Likely secondary to decreased intake plus recent diarrhea.  Has resolved with 6 L of crystalloid. Continue fluid resuscitation Pressors are off.  Cardiomyopathy with reduced EF.  Differential is ischemic vs alcohol related. LHC today.  Thiamine supplementation.  Hyponatremia Hypokalemia This may be related to the recent GI symptoms. Will correct.  Acute kidney injury Related to hypovolemia - has resolved.  Chronic Obstructive lung disease Does not appear decompensated at this time.  Bronchodilators  History of alcoholism Has been to rehab Recent admission related to alcoholism Continues to drink actively At risk for withdrawal. CIWA started Outpatient substance abuse counseling.  Recent gastroenteritis Diarrhea resolved but still with anorexia May represent hepatic congestion from cardiomyopathy. No significant abnormalities of LFT's to strongly suggest alcoholic hepatitis. Ultrasound to rule out cirrhosis.            History of major depression No evidence of acute psychiatric disease. Follow with [facility] as outpatient.   Disposition / Summary of Today's Plan 07/04/18   For LHC today. Continue treatment for ACS for now.    Diet: As tolerated DVT prophylaxis: Lovenox GI prophylaxis: Pepcid Mobility: As tolerated Code Status: Full code Family Communication: No  family at bedside  Labs   CBC: Recent  Labs  Lab 07/03/18 1502 07/03/18 1506 07/04/18 0327  WBC  --  12.6* 11.9*  NEUTROABS  --  9.4*  --   HGB 15.6 15.3 14.0  HCT 46.0 43.7 41.2  MCV  --  92.0 94.3  PLT  --  216 782    Basic Metabolic Panel: Recent Labs  Lab 07/03/18 1502 07/03/18 1506 07/03/18 1510 07/04/18 0705  NA 123* 126*  --  133*  K 2.9* 2.9*  --  2.9*  CL 86* 88*  --  105  CO2  --  24  --  21*  GLUCOSE 165* 170*  --  116*  BUN 8 11  --  8  CREATININE 1.70* 1.65*  --  0.88  CALCIUM  --  8.7*  --  7.6*  MG  --   --  2.3  --    GFR: Estimated Creatinine Clearance: 80.4 mL/min (by C-G formula based on SCr of 0.88 mg/dL). Recent Labs  Lab 07/03/18 1503 07/03/18 1506 07/03/18 1706 07/04/18 0327  WBC  --  12.6*  --  11.9*  LATICACIDVEN 4.19*  --  1.62  --     Liver Function Tests: Recent Labs  Lab 07/03/18 1506  AST 87*  ALT 94*  ALKPHOS 60  BILITOT 1.8*  PROT 6.6  ALBUMIN 3.8   No results for input(s): LIPASE, AMYLASE in the last 168 hours. No results for input(s): AMMONIA in the last 168 hours.  ABG    Component Value Date/Time   TCO2 24 07/03/2018 1502     Coagulation Profile: Recent Labs  Lab 07/03/18 1510  INR 0.86    Cardiac Enzymes: Recent Labs  Lab 07/03/18 2141 07/04/18 0220  TROPONINI 5.86* 6.47*    HbA1C: Hgb A1c MFr Bld  Date/Time Value Ref Range Status  03/02/2018 05:13 PM 5.5 4.8 - 5.6 % Final    Comment:    (NOTE) Pre diabetes:          5.7%-6.4% Diabetes:              >6.4% Glycemic control for   <7.0% adults with diabetes   05/20/2015 02:18 PM 5.7 4.6 - 6.5 % Final    Comment:    Glycemic Control Guidelines for People with Diabetes:Non Diabetic:  <6%Goal of Therapy: <7%Additional Action Suggested:  >8%     CBG: Recent Labs  Lab 07/03/18 [place] Lynasia Meloche, MD [facility] ICU Physician [place]  Pager: [phone] Mobile: [phone] After hours: [phone].

## 2018-07-04 NOTE — Progress Notes (Signed)
ANTICOAGULATION CONSULT NOTE - Follow Up Consult  Pharmacy Consult for heparin Indication: NSTEMI  Labs: Recent Labs    07/03/18 1502 07/03/18 1506 07/03/18 1510 07/03/18 2141 07/04/18 0220  HGB 15.6 15.3  --   --   --   HCT 46.0 43.7  --   --   --   PLT  --  216  --   --   --   APTT  --   --  35  --   --   LABPROT  --   --  11.6  --   --   INR  --   --  0.86  --   --   HEPARINUNFRC  --   --   --   --  <0.10*  CREATININE 1.70* 1.65*  --   --   --   TROPONINI  --   --   --  5.86*  --     Assessment: 61yo male subtherapeutic on heparin with initial dosing for NSTEMI; FOB+ yesterday afternoon, RN notes that there is some blood in stool.  Goal of Therapy:  Heparin level 0.3-0.7 units/ml   Plan:  Will increase heparin gtt by 3-4 units/kg/hr to 1000 units/hr and check level with next scheduled lab draw.    Wynona Neat, PharmD, BCPS  07/04/2018,3:04 AM

## 2018-07-04 NOTE — H&P (View-Only) (Signed)
Progress Note  Patient Name: Oscar Phillips Date of Encounter: 07/04/2018  Primary Cardiologist:   No primary care provider on file.   Subjective    No chest pain.  Cough with mild SOB.   Inpatient Medications    Scheduled Meds: . aspirin  324 mg Oral NOW   Or  . aspirin  300 mg Rectal NOW  . nicotine  14 mg Transdermal Daily   Continuous Infusions: . heparin 1,000 Units/hr (07/04/18 0322)  . norepinephrine (LEVOPHED) Adult infusion 1 mcg/min (07/04/18 0630)  . sodium chloride     PRN Meds: ipratropium-albuterol   Vital Signs    Vitals:   07/04/18 0400 07/04/18 0500 07/04/18 0600 07/04/18 0700  BP: 94/64 104/66 102/69 103/74  Pulse: 92 95 87 92  Resp: 16 17 15 15   Temp: 98.2 F (36.8 C)     TempSrc: Oral     SpO2: 92% 92% 93% 94%  Weight:      Height:        Intake/Output Summary (Last 24 hours) at 07/04/2018 0752 Last data filed at 07/04/2018 0600 Gross per 24 hour  Intake 3061.88 ml  Output 6 ml  Net 3055.88 ml   Filed Weights   07/03/18 1634 07/03/18 2025  Weight: 68 kg 65.3 kg    Telemetry    NSR - Personally Reviewed  ECG    Pending - Personally Reviewed  Physical Exam   GEN: No acute distress.   Neck: No  JVD Cardiac: RRR, no murmurs, rubs, or gallops.  Respiratory:   Decreased breath sounds with few coarse crackles.  GI: Soft, nontender, non-distended  MS: No  edema; No deformity. Neuro:  Nonfocal  Psych: Normal affect   Labs    Chemistry Recent Labs  Lab 07/03/18 1502 07/03/18 1506  NA 123* 126*  K 2.9* 2.9*  CL 86* 88*  CO2  --  24  GLUCOSE 165* 170*  BUN 8 11  CREATININE 1.70* 1.65*  CALCIUM  --  8.7*  PROT  --  6.6  ALBUMIN  --  3.8  AST  --  87*  ALT  --  94*  ALKPHOS  --  60  BILITOT  --  1.8*  GFRNONAA  --  43*  GFRAA  --  50*  ANIONGAP  --  14     Hematology Recent Labs  Lab 07/03/18 1502 07/03/18 1506 07/04/18 0327  WBC  --  12.6* 11.9*  RBC  --  4.75 4.37  HGB 15.6 15.3 14.0  HCT 46.0  43.7 41.2  MCV  --  92.0 94.3  MCH  --  32.2 32.0  MCHC  --  35.0 34.0  RDW  --  13.0 12.8  PLT  --  216 160    Cardiac Enzymes Recent Labs  Lab 07/03/18 2141 07/04/18 0220  TROPONINI 5.86* 6.47*    Recent Labs  Lab 07/03/18 1501  TROPIPOC 5.36*     BNPNo results for input(s): BNP, PROBNP in the last 168 hours.   DDimer No results for input(s): DDIMER in the last 168 hours.   Radiology    Dg Chest Portable 1 View  Result Date: 07/03/2018 CLINICAL DATA:  Syncopal episode, hypertension EXAM: PORTABLE CHEST 1 VIEW COMPARISON:  04/17/2018 FINDINGS: Defibrillator pads overlie the chest. Normal heart size and vascularity. Mild hyperinflation, suspect COPD/emphysema. Negative for edema, effusion, collapse, consolidation, or pneumothorax. Trachea is midline. Aorta is atherosclerotic. Bones are osteopenic IMPRESSION: No active disease. Electronically Signed   By:  M.  Shick M.D.   On: 07/03/2018 16:50    Cardiac Studies   Echo:  Study Conclusions  - Left ventricle: The cavity size was normal. Wall thickness was   increased in a pattern of mild LVH. Systolic function was   moderately reduced. The estimated ejection fraction was in the   range of 35% to 40%. There is akinesis of the   mid-apicalanteroseptal, anterior, anterolateral, lateral,   inferolateral, and apical myocardium. Doppler parameters are   consistent with abnormal left ventricular relaxation (grade 1   diastolic dysfunction).  Patient Profile     62 y.o. male without prior cardiac history.  He presents with hypotension and anorexia.  Elevated troponin.  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    Assessment & Plan    NSTEMI:  Reduced EF as above.  Troponin rise is flat and moderate.  Question timing of event (out of hospital).  Timing of MI not clear.  Cath today.  Start low dose beta blocker.  Continue ASA.   The patient understands that risks included but are not limited to stroke (1 in 1000), death (1 in 60), kidney failure  [usually temporary] (1 in 500), bleeding (1 in 200), allergic reaction [possibly serious] (1 in 200).  The patient understands and agrees to proceed.   TOBACCO ABUSE:  Nicotine patch.    HYOPOKALEMIA:  Supplement.    RISK REDUCTION:  Start statin.    For questions or updates, please contact [place] Please consult [email or web address] for contact info under Cardiology/STEMI.   Signed, Minus Breeding, MD  07/04/2018, 7:52 AM

## 2018-07-04 NOTE — Progress Notes (Signed)
Initial Nutrition Assessment  DOCUMENTATION CODES:   Not applicable  INTERVENTION:   Monitor magnesium, potassium, and phosphorus daily for at least 3 days, MD to replete as needed, as pt is at risk for refeeding syndrome given little to no PO intake x 5-6 days PTA and EtOH abuse.  - Once diet advanced, provide Ensure Enlive po BID, each supplement provides 350 kcal and 20 grams of protein  - Continue MVI with minerals, folic acid, and thiamine in the setting of EtOH abuse  - Once diet advanced, encourage adequate PO intake  NUTRITION DIAGNOSIS:   Inadequate oral intake related to decreased appetite, social / environmental circumstances (EtOH abuse) as evidenced by per patient/family report.  GOAL:   Patient will meet greater than or equal to 90% of their needs  MONITOR:   Diet advancement, Weight trends, Labs  REASON FOR ASSESSMENT:   Malnutrition Screening Tool    ASSESSMENT:   62 year old male who presented to the ED on 9/25 generalized fatigue, hypotension, and anorexia. PMH significant for COPD, EtOH abuse, tobacco use, hypertension, hyperlipidemia, PAD, GAD, and MDD. Pt found to have NSTEMI.  Spoke with pt, brother, and sister at bedside. RN in room providing nursing care. Noted pt going to cath lab today.  Pt lethargic and confused and was able to answer some questions. The majority of pt's history was obtained from his siblings.  Per pt's sister, pt had not eating in 5 days PTA. During this time, pt only had a few cups of Coke to drink and occasionally had crackers. Pt's brother reports that pt was trying to make an effort to eat breakfast daily (going out to eat) but did not eat lunch or supper "because it killed his buzz." Pt's brother states that this has been pt's baseline for 1 month even though pt does have a good appetite.  Pt's sister reports that pt was hospitalized for 3 weeks (between [facility] and [facility]) in June and that after discharge, he made an  effort to eat 3 meals daily but did not keep this up for long.  Pt's sister states that pt's appetite became poor after he stopped taking his blood pressure medications. She also shares that pt stopped taking folic acid, MVI, and thiamine.  Pt's sister states that pt has not had recent weight loss but did lost some weight prior to admission to [facility] in June 2019. Per weight history in chart, pt's weight has remained fairly stable between 144-147 lbs since May 2019. Pt's brother believes pt's UBW is "around 165."  Wt Readings from Last 10 Encounters:  07/03/18 65.3 kg  04/17/18 67.1 kg  04/01/18 65.8 kg  03/19/18 66.7 kg  03/02/18 66.9 kg  04/20/17 74.8 kg  09/15/16 73 kg  09/12/16 74.4 kg  07/21/16 75.9 kg  05/24/16 75 kg    Medications reviewed and include: K-dur 40 mEq BID today, IV KCl 10 mEq x 2 runs today, 1 mg folic acid daily, MVI with minerals daily, 100 mg thiamine daily  Labs reviewed: sodium 133 (L), potassium 2.9 (L) - being replaced  NUTRITION - FOCUSED PHYSICAL EXAM:    Most Recent Value  Orbital Region  Mild depletion  Upper Arm Region  Mild depletion  Thoracic and Lumbar Region  No depletion  Buccal Region  No depletion  Temple Region  Mild depletion  Clavicle Bone Region  Mild depletion  Clavicle and Acromion Bone Region  Mild depletion  Scapular Bone Region  No depletion  Dorsal Hand  No depletion  Patellar Region  No depletion  Anterior Thigh Region  Mild depletion  Posterior Calf Region  No depletion  Edema (RD Assessment)  None  Hair  Reviewed  Eyes  Reviewed  Mouth  Reviewed  Skin  Reviewed  Nails  Reviewed       Diet Order:   Diet Order            Diet NPO time specified  Diet effective now              EDUCATION NEEDS:   Not appropriate for education at this time  Skin:  Skin Assessment: Reviewed RN Assessment  Last BM:  07/04/18 - small type 6  Height:   Ht Readings from Last 1 Encounters:  07/03/18 5\' 9"  (1.753 m)     Weight:   Wt Readings from Last 1 Encounters:  07/03/18 65.3 kg    Ideal Body Weight:  72.73 kg  BMI:  Body mass index is 21.26 kg/m.  Estimated Nutritional Needs:   Kcal:  2000-2200  Protein:  90-105 grams  Fluid:  2.0-2.2 L    Gaynell Face, MS, RD, LDN Inpatient Clinical Dietitian Pager: [phone] Weekend/After Hours: [phone]

## 2018-07-04 NOTE — Interval H&P Note (Signed)
Cath Lab Visit (complete for each Cath Lab visit)  Clinical Evaluation Leading to the Procedure:   ACS: Yes.    Non-ACS:    Anginal Classification: CCS Phillips  Anti-ischemic medical therapy: Minimal Therapy (1 class of medications)  Non-Invasive Test Results: No non-invasive testing performed  Prior CABG: No previous CABG      History and Physical Interval Note:  07/04/2018 12:42 PM  Oscar Phillips  has presented today for surgery, with the diagnosis of NSTEMI  The various methods of treatment have been discussed with the patient and family. After consideration of risks, benefits and other options for treatment, the patient has consented to  Procedure(s): LEFT HEART CATH AND CORONARY ANGIOGRAPHY (N/A) as a surgical intervention .  The patient's history has been reviewed, patient examined, no change in status, stable for surgery.  I have reviewed the patient's chart and labs.  Questions were answered to the patient's satisfaction.     Oscar Phillips

## 2018-07-04 NOTE — Progress Notes (Signed)
Progress Note  Patient Name: Oscar Phillips Date of Encounter: 07/04/2018  Primary Cardiologist:   No primary care provider on file.   Subjective    No chest pain.  Cough with mild SOB.   Inpatient Medications    Scheduled Meds: . aspirin  324 mg Oral NOW   Or  . aspirin  300 mg Rectal NOW  . nicotine  14 mg Transdermal Daily   Continuous Infusions: . heparin 1,000 Units/hr (07/04/18 0322)  . norepinephrine (LEVOPHED) Adult infusion 1 mcg/min (07/04/18 0630)  . sodium chloride     PRN Meds: ipratropium-albuterol   Vital Signs    Vitals:   07/04/18 0400 07/04/18 0500 07/04/18 0600 07/04/18 0700  BP: 94/64 104/66 102/69 103/74  Pulse: 92 95 87 92  Resp: 16 17 15 15   Temp: 98.2 F (36.8 C)     TempSrc: Oral     SpO2: 92% 92% 93% 94%  Weight:      Height:        Intake/Output Summary (Last 24 hours) at 07/04/2018 0752 Last data filed at 07/04/2018 0600 Gross per 24 hour  Intake 3061.88 ml  Output 6 ml  Net 3055.88 ml   Filed Weights   07/03/18 1634 07/03/18 2025  Weight: 68 kg 65.3 kg    Telemetry    NSR - Personally Reviewed  ECG    Pending - Personally Reviewed  Physical Exam   GEN: No acute distress.   Neck: No  JVD Cardiac: RRR, no murmurs, rubs, or gallops.  Respiratory:   Decreased breath sounds with few coarse crackles.  GI: Soft, nontender, non-distended  MS: No  edema; No deformity. Neuro:  Nonfocal  Psych: Normal affect   Labs    Chemistry Recent Labs  Lab 07/03/18 1502 07/03/18 1506  NA 123* 126*  K 2.9* 2.9*  CL 86* 88*  CO2  --  24  GLUCOSE 165* 170*  BUN 8 11  CREATININE 1.70* 1.65*  CALCIUM  --  8.7*  PROT  --  6.6  ALBUMIN  --  3.8  AST  --  87*  ALT  --  94*  ALKPHOS  --  60  BILITOT  --  1.8*  GFRNONAA  --  43*  GFRAA  --  50*  ANIONGAP  --  14     Hematology Recent Labs  Lab 07/03/18 1502 07/03/18 1506 07/04/18 0327  WBC  --  12.6* 11.9*  RBC  --  4.75 4.37  HGB 15.6 15.3 14.0  HCT 46.0  43.7 41.2  MCV  --  92.0 94.3  MCH  --  32.2 32.0  MCHC  --  35.0 34.0  RDW  --  13.0 12.8  PLT  --  216 160    Cardiac Enzymes Recent Labs  Lab 07/03/18 2141 07/04/18 0220  TROPONINI 5.86* 6.47*    Recent Labs  Lab 07/03/18 1501  TROPIPOC 5.36*     BNPNo results for input(s): BNP, PROBNP in the last 168 hours.   DDimer No results for input(s): DDIMER in the last 168 hours.   Radiology    Dg Chest Portable 1 View  Result Date: 07/03/2018 CLINICAL DATA:  Syncopal episode, hypertension EXAM: PORTABLE CHEST 1 VIEW COMPARISON:  04/17/2018 FINDINGS: Defibrillator pads overlie the chest. Normal heart size and vascularity. Mild hyperinflation, suspect COPD/emphysema. Negative for edema, effusion, collapse, consolidation, or pneumothorax. Trachea is midline. Aorta is atherosclerotic. Bones are osteopenic IMPRESSION: No active disease. Electronically Signed   By:  M.  Shick M.D.   On: 07/03/2018 16:50    Cardiac Studies   Echo:  Study Conclusions  - Left ventricle: The cavity size was normal. Wall thickness was   increased in a pattern of mild LVH. Systolic function was   moderately reduced. The estimated ejection fraction was in the   range of 35% to 40%. There is akinesis of the   mid-apicalanteroseptal, anterior, anterolateral, lateral,   inferolateral, and apical myocardium. Doppler parameters are   consistent with abnormal left ventricular relaxation (grade 1   diastolic dysfunction).  Patient Profile     62 y.o. male without prior cardiac history.  He presents with hypotension and anorexia.  Elevated troponin.  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    Assessment & Plan    NSTEMI:  Reduced EF as above.  Troponin rise is flat and moderate.  Question timing of event (out of hospital).  Timing of MI not clear.  Cath today.  Start low dose beta blocker.  Continue ASA.   The patient understands that risks included but are not limited to stroke (1 in 1000), death (1 in 58), kidney failure  [usually temporary] (1 in 500), bleeding (1 in 200), allergic reaction [possibly serious] (1 in 200).  The patient understands and agrees to proceed.   TOBACCO ABUSE:  Nicotine patch.    HYOPOKALEMIA:  Supplement.    RISK REDUCTION:  Start statin.    For questions or updates, please contact [place] Please consult [email or web address] for contact info under Cardiology/STEMI.   Signed, Minus Breeding, MD  07/04/2018, 7:52 AM

## 2018-07-05 ENCOUNTER — Encounter (HOSPITAL_COMMUNITY): Payer: Self-pay | Admitting: Interventional Cardiology

## 2018-07-05 LAB — BASIC METABOLIC PANEL
Anion gap: 7 (ref 5–15)
BUN: 9 mg/dL (ref 8–23)
CO2: 21 mmol/L — AB (ref 22–32)
Calcium: 8 mg/dL — ABNORMAL LOW (ref 8.9–10.3)
Chloride: 104 mmol/L (ref 98–111)
Creatinine, Ser: 0.75 mg/dL (ref 0.61–1.24)
GFR calc non Af Amer: >60 mL/min (ref >60)
Glucose, Bld: 92 mg/dL (ref 70–99)
POTASSIUM: 3.8 mmol/L (ref 3.5–5.1)
Sodium: 132 mmol/L — ABNORMAL LOW (ref 135–145)

## 2018-07-05 LAB — LIPID PANEL
CHOLESTEROL: 109 mg/dL (ref 0–200)
HDL: 50 mg/dL (ref >40)
LDL Cholesterol: 46 mg/dL (ref 0–99)
TRIGLYCERIDES: 66 mg/dL (ref <150)
Total CHOL/HDL Ratio: 2.2 RATIO
VLDL: 13 mg/dL (ref 0–40)

## 2018-07-05 LAB — SEDIMENTATION RATE: Sed Rate: 0 mm/hr (ref 0–16)

## 2018-07-05 LAB — MAGNESIUM: Magnesium: 1.9 mg/dL (ref 1.7–2.4)

## 2018-07-05 LAB — GLUCOSE, CAPILLARY: Glucose-Capillary: 105 mg/dL — ABNORMAL HIGH (ref 70–99)

## 2018-07-05 MED ORDER — MAGNESIUM SULFATE 2 GM/50ML IV SOLN
2.0000 g | Freq: Once | INTRAVENOUS | Status: AC
  Administered 2018-07-05: 2 g via INTRAVENOUS
  Filled 2018-07-05: qty 50

## 2018-07-05 MED ORDER — POTASSIUM CHLORIDE 10 MEQ/100ML IV SOLN
10.0000 meq | INTRAVENOUS | Status: AC
  Administered 2018-07-05 (×2): 10 meq via INTRAVENOUS
  Filled 2018-07-05: qty 100

## 2018-07-05 MED ORDER — DEXMEDETOMIDINE HCL IN NACL 400 MCG/100ML IV SOLN
0.2000 ug/kg/h | INTRAVENOUS | Status: DC
  Administered 2018-07-05: 0.3 ug/kg/h via INTRAVENOUS
  Filled 2018-07-05: qty 100

## 2018-07-05 NOTE — H&P (Signed)
NAME:  Oscar Phillips, MRN:  [id number], DOB:  03-06-1956, LOS: 2 ADMISSION DATE:  07/03/2018, CONSULTATION DATE:  07/03/18 REFERRING MD:  Billy Fischer, CHIEF COMPLAINT: Generalized fatigue, listlessness  Brief History   Patient, current every day smoker,  with a 9-day history of generalized fatigue, listlessness Has not been eating for about 6 days-generally has no appetite Symptoms started 9 days ago with diarrhea-felt he had a GI bug History of alcoholism History of COPD Recent hospitalization has been to behavioral health, alcohol-related issues   Past Medical History   He drinks daily-he claimed is a moderate drinker 11/2 to 2 packs a day smoker  Significant Hospital Events   Hypotension Behavioral health admission 04/02/2018  Consults: date of consult/date signed off & final recs:  Cardiology 07/03/2018--to be transferred to [facility]  Procedures (surgical and bedside):  Bedside echocardiogram reportedly with evidence of hypokinesis, low ejection fraction  Significant Diagnostic Tests:  X-ray 07/03/2018-no acute infiltrate Echocardiogram performed at bedside-reportedly low ejection fraction 9/26 Left Heart Cath>>   Widely patent left main.  Segmental 50% LAD narrowing.  Large first diagonal with 70% stenosis in the mid to distal vessel.  Large normal ramus intermedius  Circumflex coronary gives origin to 3 tiny obtuse marginal branches.  50-60 % stenosis noted in the mid body of the circumflex.  Dominant right coronary with luminal irregularities.  Apical ballooning identified on left ventriculography consistent with Takotsubo cardiomyopathy  Severe left ventricular systolic dysfunction with EF less than 25% and LVEDP 24 mmHg  Cardiogenic shock on presentation now improving and patient is off pressor therapy 9/26 US Liver >> Liver: Echogenic parenchyma, likely fatty infiltration though this can be seen with cirrhosis and certain infiltrative disorders.  No focal hepatic mass or nodularity. Portal vein is patent on color Doppler imaging with normal direction of blood flow towards the liver. 06/01/2015 myocardial perfusion imaging did reveal ejection fraction of 63%  07/06/2015-PFT with mild obstructive disease-affecting small airways, mild diffusion deficit Micro Data:  9/25: Blood cultures>>  Did receive a dose of vancomycin  Antimicrobials:  Vancomycin 07/03/2018>> Cefepime 07/03/2018>>  Subjective:  Sedated on precedex for severe agitation 9/27 early am Weaning with goal of transition back to CIWA protocol, currently on 0.2 mg Received 9 mg ativan in 6 hours prior to initiation of the drip 9/27  Objective   Blood pressure (!) 87/61, pulse 84, temperature 97.9 F (36.6 C), temperature source Oral, resp. rate 18, height 5\' 9"  (1.753 m), weight 68.3 kg, SpO2 92 %.        Intake/Output Summary (Last 24 hours) at 07/05/2018 1028 Last data filed at 07/05/2018 0800 Gross per 24 hour  Intake 911.04 ml  Output -  Net 911.04 ml   Filed Weights   07/03/18 1634 07/03/18 2025 07/05/18 0100  Weight: 68 kg 65.3 kg 68.3 kg    Examination: General: Sedated on Precedex, wearing [place], saturations are 96%, NAD HENT: NCAT, No LAD, - JVD Lungs: Bilateral excursion, Coarse throughout, diminished per bases, no wheeze Cardiovascular: JVP remains flat. No edema, S1, S2 normal with S4 heard. PP+, RRR, RBBB per tele Abdomen Soft and flat, BS quiet, NT, ND Extremities: No clubbing, no edema, no obvious deformities Neuro: Sedated on precedex, MAE x 4, No following commands  GU: Condom cath, no urine in foley bag   Resolved Hospital Problem list     Assessment & Plan:   Hypovolemic shock Likely secondary to decreased intake plus recent diarrhea.  Has resolved with 6 L of crystalloid. +  3.9 L since admission Pressors are off. 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 LH Cath 9/26 results as above EF less than 25%, LVEDP 24 mm Hg Plan: Appreciate cardiology assist Continue ASA,  BB and lisinopril Maintain euvolemia Trend mag Trend troponins Replete magnesium prn EKG prn   Hyponatremia Hypokalemia Most likely 2/2 recent GI symptoms Plan: Trend BMET daily Replete electrolytes as needed Avoid free water    Acute kidney injury Related to hypovolemia  Minimal UP last 24>> voided x 1 large amount in the bed>> unable to measure Plan: Tend CMET Monitor UO  Bladder scan prn May need foley for I&O if continued issues Avoid nephrotoxic medications  Chronic Obstructive lung disease Smokes 2PPP Minimal obstructive airway disease with mild diffusion deficit  per PFT's 2016 Does not appear decompensated at this time.  Plan: Bronchodilators as ordered Titrate oxygen for Saturation goal of 88-94% Minimize sedation as able  History of alcoholism Has been to rehab Recent admission related to alcoholism Continues to drink actively per brother 12+ beers daily + withdrawal  With hallucinations and agitation 9/27 early am Precedex initiated>> soft BP Plan: Wean precedex as able Transition back to CIWA protocol Outpatient substance abuse counseling. Thiamine and Folate  Recent gastroenteritis Diarrhea resolved but still with anorexia May represent hepatic congestion from cardiomyopathy. No significant abnormalities of LFT's to strongly suggest alcoholic hepatitis. [place] + for   Echogenic parenchyma, likely fatty infiltration though this can be seen with cirrhosis and certain infiltrative disorders. No focal hepatic mass or nodularity. Portal vein is patent on color Doppler imaging with normal direction of blood flow towards the liver. 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  Plan: Follow with [facility] as outpatient.   Disposition / Summary of Today's Plan 07/05/18   Continue ASA, BB and Lisinopril Wean precedex  And transition to CIWA protocol Support BP>> currently off pressors, + 3.9     Diet: As tolerated DVT prophylaxis: Lovenox GI prophylaxis: Pepcid Mobility: As tolerated Code Status: Full code Family Communication: Brother updated at bedside 9/27  Labs   CBC: Recent Labs  Lab 07/03/18 1502 07/03/18 1506 07/04/18 0327  WBC  --  12.6* 11.9*  NEUTROABS  --  9.4*  --   HGB 15.6 15.3 14.0  HCT 46.0 43.7 41.2  MCV  --  92.0 94.3  PLT  --  216 081    Basic Metabolic Panel: Recent Labs  Lab 07/03/18 1502 07/03/18 1506 07/03/18 1510 07/04/18 0705 07/04/18 1017 07/05/18 0256  NA 123* 126*  --  133*  --  132*  K 2.9* 2.9*  --  2.9* 3.2* 3.8  CL 86* 88*  --  105  --  104  CO2  --  24  --  21*  --  21*  GLUCOSE 165* 170*  --  116*  --  92  BUN 8 11  --  8  --  9  CREATININE 1.70* 1.65*  --  0.88  --  0.75  CALCIUM  --  8.7*  --  7.6*  --  8.0*  MG  --   --  2.3  --   --  1.9   GFR: Estimated Creatinine Clearance: 92.5 mL/min (by C-G formula based on SCr of 0.75 mg/dL). Recent Labs  Lab 07/03/18 1503 07/03/18 1506 07/03/18 1706 07/04/18 0327  WBC  --  12.6*  --  11.9*  LATICACIDVEN 4.19*  --  1.62  --     Liver Function Tests: Recent Labs  Lab 07/03/18 1506  AST 87*  ALT 94*  ALKPHOS 60  BILITOT 1.8*  PROT 6.6  ALBUMIN 3.8   No results for input(s): LIPASE, AMYLASE in the last 168 hours. No results for input(s): AMMONIA in the last 168 hours.  ABG    Component Value Date/Time   TCO2 24 07/03/2018 1502     Coagulation  Profile: Recent Labs  Lab 07/03/18 1510  INR 0.86    Cardiac Enzymes: Recent Labs  Lab 07/03/18 2141 07/04/18 0220 07/04/18 1017  TROPONINI 5.86* 6.47* 4.63*    HbA1C: Hgb A1c MFr Bld  Date/Time Value Ref Range Status  03/02/2018 05:13 PM 5.5 4.8 - 5.6 % Final    Comment:    (NOTE) Pre diabetes:          5.7%-6.4% Diabetes:              >6.4% Glycemic control for   <7.0% adults with diabetes   05/20/2015 02:18 PM 5.7 4.6 - 6.5 % Final    Comment:    Glycemic Control Guidelines for People with Diabetes:Non Diabetic:  <6%Goal of Therapy: <7%Additional Action Suggested:  >8%     CBG: Recent Labs  Lab 07/03/18 1453 07/05/18 0800  GLUCAP 175* 105*   Magdalen Spatz, AGACNP-BC [facility]  Pager: [phone] After hours: [phone].  07/05/2018 10:28 AM

## 2018-07-05 NOTE — Progress Notes (Signed)
[facility] Progress Note Patient Name: Oscar Phillips DOB: 1956-10-04 MRN: [id number]   Date of Service  07/05/2018  HPI/Events of Note  Pt on CIWA protocol receiving Ativan 2-3 mg doses with breakthrough agitation/ delirium  eICU Interventions  Low dose Precedex infusion + continue prn Ativan        Tel Hevia U Tegan Britain 07/05/2018, 5:07 AM

## 2018-07-05 NOTE — Progress Notes (Signed)
Progress Note  Patient Name: Oscar Phillips Date of Encounter: 07/05/2018  Primary Cardiologist:   No primary care provider on file.   Subjective    Sedated, snoring.  Agitated and withdrawing last night.  On Precedex  Inpatient Medications    Scheduled Meds: . atorvastatin  80 mg Oral q1800  . chlorhexidine  15 mL Mouth Rinse BID  . folic acid  1 mg Oral Daily  . heparin  5,000 Units Subcutaneous Q8H  . lisinopril  5 mg Oral Daily  . mouth rinse  15 mL Mouth Rinse q12n4p  . metoprolol succinate  25 mg Oral Daily  . multivitamin with minerals  1 tablet Oral Daily  . nicotine  14 mg Transdermal Daily  . sodium chloride flush  3 mL Intravenous Q12H  . thiamine  100 mg Oral Daily  . tiotropium  1 capsule Inhalation Daily   Continuous Infusions: . sodium chloride 10 mL/hr at 07/05/18 0400  . dexmedetomidine (PRECEDEX) IV infusion 0.7 mcg/kg/hr (07/05/18 0640)  . sodium chloride     PRN Meds: sodium chloride, acetaminophen, ipratropium-albuterol, LORazepam, ondansetron (ZOFRAN) IV, sodium chloride flush   Vital Signs    Vitals:   07/05/18 0420 07/05/18 0500 07/05/18 0600 07/05/18 0700  BP:  124/76 (!) 134/110 (!) 89/56  Pulse:  62 88 84  Resp:  (!) 25 (!) 21 18  Temp: 98 F (36.7 C)     TempSrc: Oral     SpO2:  (!) 88% (!) 76% 92%  Weight:      Height:        Intake/Output Summary (Last 24 hours) at 07/05/2018 0738 Last data filed at 07/05/2018 0640 Gross per 24 hour  Intake 895.14 ml  Output -  Net 895.14 ml   Filed Weights   07/03/18 1634 07/03/18 2025 07/05/18 0100  Weight: 68 kg 65.3 kg 68.3 kg    Telemetry    NSR - Personally Reviewed  ECG    NA- Personally Reviewed  Physical Exam   GEN: No  acute distress.   Neck: No  JVD Cardiac: RRR, no murmurs, rubs, or gallops.  Respiratory: Clear  to auscultation bilaterally. GI: Soft, nontender, non-distended, normal bowel sounds  MS:  No edema; No deformity. Neuro:   Sedated Psych:   Unable  to assess.    Labs    Chemistry Recent Labs  Lab 07/03/18 1506 07/04/18 0705 07/04/18 1017 07/05/18 0256  NA 126* 133*  --  132*  K 2.9* 2.9* 3.2* 3.8  CL 88* 105  --  104  CO2 24 21*  --  21*  GLUCOSE 170* 116*  --  92  BUN 11 8  --  9  CREATININE 1.65* 0.88  --  0.75  CALCIUM 8.7* 7.6*  --  8.0*  PROT 6.6  --   --   --   ALBUMIN 3.8  --   --   --   AST 87*  --   --   --   ALT 94*  --   --   --   ALKPHOS 60  --   --   --   BILITOT 1.8*  --   --   --   GFRNONAA 43* >60  --  >60  GFRAA 50* >60  --  >60  ANIONGAP 14 7  --  7     Hematology Recent Labs  Lab 07/03/18 1502 07/03/18 1506 07/04/18 0327  WBC  --  12.6* 11.9*  RBC  --  4.75 4.37  HGB 15.6 15.3 14.0  HCT 46.0 43.7 41.2  MCV  --  92.0 94.3  MCH  --  32.2 32.0  MCHC  --  35.0 34.0  RDW  --  13.0 12.8  PLT  --  216 160    Cardiac Enzymes Recent Labs  Lab 07/03/18 2141 07/04/18 0220 07/04/18 1017  TROPONINI 5.86* 6.47* 4.63*    Recent Labs  Lab 07/03/18 1501  TROPIPOC 5.36*     BNPNo results for input(s): BNP, PROBNP in the last 168 hours.   DDimer No results for input(s): DDIMER in the last 168 hours.   Radiology    US Abdomen Complete  Result Date: 07/04/2018 CLINICAL DATA:  Abdominal pain EXAM: ABDOMEN ULTRASOUND COMPLETE COMPARISON:  03/02/2018 FINDINGS: Gallbladder: Normally distended without stones or wall thickening. No pericholecystic fluid or sonographic Murphy sign. Common bile duct: Diameter: Normal caliber 3 mm diameter Liver: Echogenic parenchyma, likely fatty infiltration though this can be seen with cirrhosis and certain infiltrative disorders. No focal hepatic mass or nodularity. Portal vein is patent on color Doppler imaging with normal direction of blood flow towards the liver. IVC: Normal appearance Pancreas: Portions of the pancreatic head through mid tail normal appearance, remainder obscured by bowel gas. Spleen: Normal appearance, 5.5 cm length Right Kidney: Length: 12.6  cm. Normal morphology without mass or hydronephrosis. Left Kidney: Length: 11.1 cm. Normal morphology without mass or hydronephrosis. Abdominal aorta: Proximal and mid portions are normal in caliber. Distally the aorta is obscured by bowel gas. Other findings: No free fluid IMPRESSION: Incomplete pancreatic and aortic visualization. Probable fatty infiltration of liver as above. No acute abnormalities otherwise identified. Electronically Signed   By: Lavonia Dana M.D.   On: 07/04/2018 16:28   Dg Chest Portable 1 View  Result Date: 07/03/2018 CLINICAL DATA:  Syncopal episode, hypertension EXAM: PORTABLE CHEST 1 VIEW COMPARISON:  04/17/2018 FINDINGS: Defibrillator pads overlie the chest. Normal heart size and vascularity. Mild hyperinflation, suspect COPD/emphysema. Negative for edema, effusion, collapse, consolidation, or pneumothorax. Trachea is midline. Aorta is atherosclerotic. Bones are osteopenic IMPRESSION: No active disease. Electronically Signed   By: Jerilynn Mages.  Shick M.D.   On: 07/03/2018 16:50    Cardiac Studies   Echo:  Study Conclusions  - Left ventricle: The cavity size was normal. Wall thickness was   increased in a pattern of mild LVH. Systolic function was   moderately reduced. The estimated ejection fraction was in the   range of 35% to 40%. There is akinesis of the   mid-apicalanteroseptal, anterior, anterolateral, lateral,   inferolateral, and apical myocardium. Doppler parameters are   consistent with abnormal left ventricular relaxation (grade 1   diastolic dysfunction).  Cath  Widely patent left main.  Segmental 50% LAD narrowing.  Large first diagonal with 70% stenosis in the mid to distal vessel.  Large normal ramus intermedius  Circumflex coronary gives origin to 3 tiny obtuse marginal branches.  50-60 % stenosis noted in the mid body of the circumflex.  Dominant right coronary with luminal irregularities.  Apical ballooning identified on left ventriculography  consistent with Takotsubo cardiomyopathy  Severe left ventricular systolic dysfunction with EF less than 25% and LVEDP 24 mmHg  Cardiogenic shock on presentation now improving and patient is off pressor therapy.   Patient Profile     62 y.o. male without prior cardiac history.  He presents with hypotension and anorexia.  Elevated troponin.  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    Assessment & Plan  NSTEMI:   Possible Takotsubo vs alcoholic cardiomyopathy.  Continue ASA.  Beta blocker and Lisinopril as tolerated.  He did not get his lisinopril secondary to low BP.    Unable to titrate meds.  He seems to be euvolemic.  TOBACCO ABUSE:  On a nicotine patch.  HYOPOKALEMIA:  Supplemented and normalized.   RISK REDUCTION:  Started statin.   ETOH WITHDRAWAL:    Check magnesium level.     For questions or updates, please contact [place] Please consult [email or web address] for contact info under Cardiology/STEMI.   Signed, Minus Breeding, MD  07/05/2018, 7:38 AM

## 2018-07-06 ENCOUNTER — Inpatient Hospital Stay (HOSPITAL_COMMUNITY): Payer: BLUE CROSS/BLUE SHIELD

## 2018-07-06 DIAGNOSIS — J9601 Acute respiratory failure with hypoxia: Secondary | ICD-10-CM

## 2018-07-06 DIAGNOSIS — I420 Dilated cardiomyopathy: Secondary | ICD-10-CM

## 2018-07-06 DIAGNOSIS — J9690 Respiratory failure, unspecified, unspecified whether with hypoxia or hypercapnia: Secondary | ICD-10-CM

## 2018-07-06 DIAGNOSIS — Z515 Encounter for palliative care: Secondary | ICD-10-CM

## 2018-07-06 DIAGNOSIS — F10231 Alcohol dependence with withdrawal delirium: Secondary | ICD-10-CM

## 2018-07-06 DIAGNOSIS — R55 Syncope and collapse: Secondary | ICD-10-CM

## 2018-07-06 DIAGNOSIS — E785 Hyperlipidemia, unspecified: Secondary | ICD-10-CM

## 2018-07-06 DIAGNOSIS — I10 Essential (primary) hypertension: Secondary | ICD-10-CM

## 2018-07-06 LAB — COMPREHENSIVE METABOLIC PANEL
ALBUMIN: 2.7 g/dL — AB (ref 3.5–5.0)
ALT: 71 U/L — ABNORMAL HIGH (ref 0–44)
ANION GAP: 8 (ref 5–15)
AST: 71 U/L — ABNORMAL HIGH (ref 15–41)
Alkaline Phosphatase: 50 U/L (ref 38–126)
BUN: 9 mg/dL (ref 8–23)
CO2: 21 mmol/L — AB (ref 22–32)
Calcium: 8.2 mg/dL — ABNORMAL LOW (ref 8.9–10.3)
Chloride: 101 mmol/L (ref 98–111)
Creatinine, Ser: 0.83 mg/dL (ref 0.61–1.24)
GFR calc Af Amer: >60 mL/min (ref >60)
GFR calc non Af Amer: >60 mL/min (ref >60)
GLUCOSE: 100 mg/dL — AB (ref 70–99)
Potassium: 3.7 mmol/L (ref 3.5–5.1)
SODIUM: 130 mmol/L — AB (ref 135–145)
TOTAL PROTEIN: 5.3 g/dL — AB (ref 6.5–8.1)
Total Bilirubin: 0.9 mg/dL (ref 0.3–1.2)

## 2018-07-06 LAB — CBC
HEMATOCRIT: 36.9 % — AB (ref 39.0–52.0)
HEMOGLOBIN: 12.4 g/dL — AB (ref 13.0–17.0)
MCH: 31.6 pg (ref 26.0–34.0)
MCHC: 33.6 g/dL (ref 30.0–36.0)
MCV: 93.9 fL (ref 78.0–100.0)
Platelets: 156 10*3/uL (ref 150–400)
RBC: 3.93 MIL/uL — ABNORMAL LOW (ref 4.22–5.81)
RDW: 12.8 % (ref 11.5–15.5)
WBC: 8.7 10*3/uL (ref 4.0–10.5)

## 2018-07-06 LAB — SEDIMENTATION RATE: Sed Rate: 48 mm/hr — ABNORMAL HIGH (ref 0–16)

## 2018-07-06 LAB — MAGNESIUM: MAGNESIUM: 1.8 mg/dL (ref 1.7–2.4)

## 2018-07-06 LAB — BRAIN NATRIURETIC PEPTIDE: B NATRIURETIC PEPTIDE 5: 1708.4 pg/mL — AB (ref 0.0–100.0)

## 2018-07-06 LAB — PHOSPHORUS: Phosphorus: 2.8 mg/dL (ref 2.5–4.6)

## 2018-07-06 LAB — TROPONIN I: Troponin I: 0.97 ng/mL (ref <0.03)

## 2018-07-06 MED ORDER — LISINOPRIL 10 MG PO TABS
10.0000 mg | ORAL_TABLET | Freq: Every day | ORAL | Status: DC
  Administered 2018-07-06 – 2018-07-09 (×4): 10 mg via ORAL
  Filled 2018-07-06 (×3): qty 1

## 2018-07-06 MED ORDER — METOPROLOL SUCCINATE ER 50 MG PO TB24: 50.0000 mg | ORAL_TABLET | Freq: Every day | ORAL | Status: DC

## 2018-07-06 MED ORDER — METOPROLOL SUCCINATE ER 50 MG PO TB24
50.0000 mg | ORAL_TABLET | Freq: Every day | ORAL | Status: DC
  Administered 2018-07-06 – 2018-07-09 (×4): 50 mg via ORAL
  Filled 2018-07-06 (×4): qty 1

## 2018-07-06 NOTE — Progress Notes (Signed)
Progress Note  Patient Name: Oscar Phillips Date of Encounter: 07/06/2018  Primary Cardiologist: No primary care provider on file. [place]  Subjective   Alert, fully oriented, clearly intelligent. Calm, no tremor. Has pleuritic left sided chest pain and a productive cough.  Inpatient Medications    Scheduled Meds: . atorvastatin  80 mg Oral q1800  . chlorhexidine  15 mL Mouth Rinse BID  . folic acid  1 mg Oral Daily  . heparin  5,000 Units Subcutaneous Q8H  . lisinopril  10 mg Oral Daily  . mouth rinse  15 mL Mouth Rinse q12n4p  . metoprolol succinate  25 mg Oral Daily  . multivitamin with minerals  1 tablet Oral Daily  . nicotine  14 mg Transdermal Daily  . sodium chloride flush  3 mL Intravenous Q12H  . thiamine  100 mg Oral Daily  . tiotropium  1 capsule Inhalation Daily   Continuous Infusions: . sodium chloride 10 mL/hr at 07/05/18 0400  . sodium chloride     PRN Meds: sodium chloride, acetaminophen, ipratropium-albuterol, LORazepam, ondansetron (ZOFRAN) IV, sodium chloride flush   Vital Signs    Vitals:   07/06/18 0500 07/06/18 0600 07/06/18 0700 07/06/18 0808  BP: 109/62 121/77 (!) 136/57   Pulse: 88 91 90   Resp: 19 19 19    Temp:    98 F (36.7 C)  TempSrc:    Oral  SpO2:      Weight:      Height:        Intake/Output Summary (Last 24 hours) at 07/06/2018 0927 Last data filed at 07/06/2018 0300 Gross per 24 hour  Intake 495.36 ml  Output 500 ml  Net -4.64 ml   Filed Weights   07/03/18 2025 07/05/18 0100 07/06/18 0300  Weight: 65.3 kg 68.3 kg 71.2 kg    Telemetry    NSR - Personally Reviewed  ECG    07/04/2018 NSR, RBBB, LAD, extensive and prominent T wave inversion and prolonged QT (QTC 577 ms) - Personally Reviewed  Physical Exam  Calm, coughing GEN: No acute distress.   Neck: No JVD Cardiac: RRR, no murmurs, rubs, or gallops.  Respiratory: Clear to auscultation bilaterally. GI: Soft, nontender, non-distended  MS: No edema; No  deformity. Neuro:  Nonfocal  Psych: Normal affect   Labs    Chemistry Recent Labs  Lab 07/03/18 1506 07/04/18 0705 07/04/18 1017 07/05/18 0256 07/06/18 0257  NA 126* 133*  --  132* 130*  K 2.9* 2.9* 3.2* 3.8 3.7  CL 88* 105  --  104 101  CO2 24 21*  --  21* 21*  GLUCOSE 170* 116*  --  92 100*  BUN 11 8  --  9 9  CREATININE 1.65* 0.88  --  0.75 0.83  CALCIUM 8.7* 7.6*  --  8.0* 8.2*  PROT 6.6  --   --   --  5.3*  ALBUMIN 3.8  --   --   --  2.7*  AST 87*  --   --   --  71*  ALT 94*  --   --   --  71*  ALKPHOS 60  --   --   --  50  BILITOT 1.8*  --   --   --  0.9  GFRNONAA 43* >60  --  >60 >60  GFRAA 50* >60  --  >60 >60  ANIONGAP 14 7  --  7 8     Hematology Recent Labs  Lab 07/03/18 1506  07/04/18 0327 07/06/18 0257  WBC 12.6* 11.9* 8.7  RBC 4.75 4.37 3.93*  HGB 15.3 14.0 12.4*  HCT 43.7 41.2 36.9*  MCV 92.0 94.3 93.9  MCH 32.2 32.0 31.6  MCHC 35.0 34.0 33.6  RDW 13.0 12.8 12.8  PLT 216 160 156    Cardiac Enzymes Recent Labs  Lab 07/03/18 2141 07/04/18 0220 07/04/18 1017 07/06/18 0257  TROPONINI 5.86* 6.47* 4.63* 0.97*    Recent Labs  Lab 07/03/18 1501  TROPIPOC 5.36*     BNP Recent Labs  Lab 07/06/18 0257  BNP 1,708.4*     DDimer No results for input(s): DDIMER in the last 168 hours.   Radiology    US Abdomen Complete  Result Date: 07/04/2018 CLINICAL DATA:  Abdominal pain EXAM: ABDOMEN ULTRASOUND COMPLETE COMPARISON:  03/02/2018 FINDINGS: Gallbladder: Normally distended without stones or wall thickening. No pericholecystic fluid or sonographic Murphy sign. Common bile duct: Diameter: Normal caliber 3 mm diameter Liver: Echogenic parenchyma, likely fatty infiltration though this can be seen with cirrhosis and certain infiltrative disorders. No focal hepatic mass or nodularity. Portal vein is patent on color Doppler imaging with normal direction of blood flow towards the liver. IVC: Normal appearance Pancreas: Portions of the pancreatic head  through mid tail normal appearance, remainder obscured by bowel gas. Spleen: Normal appearance, 5.5 cm length Right Kidney: Length: 12.6 cm. Normal morphology without mass or hydronephrosis. Left Kidney: Length: 11.1 cm. Normal morphology without mass or hydronephrosis. Abdominal aorta: Proximal and mid portions are normal in caliber. Distally the aorta is obscured by bowel gas. Other findings: No free fluid IMPRESSION: Incomplete pancreatic and aortic visualization. Probable fatty infiltration of liver as above. No acute abnormalities otherwise identified. Electronically Signed   By: Lavonia Dana M.D.   On: 07/04/2018 16:28   Dg Chest Port 1 View  Result Date: 07/06/2018 CLINICAL DATA:  Respiratory failure EXAM: PORTABLE CHEST 1 VIEW COMPARISON:  July 03, 2018 FINDINGS: There is airspace consolidation throughout much of the left upper lobe. There is more patchy opacity in the right mid lung region. There is interstitial prominence with probable mild pulmonary edema in the bases. Heart is borderline enlarged with pulmonary vascularity normal. No adenopathy. There is aortic atherosclerosis. No bone lesions. IMPRESSION: Airspace consolidation throughout much of the left upper lobe and to a much lesser degree in the right mid lung, likely foci of pneumonia. Suspect mild basilar interstitial edema. Stable cardiac prominence. There is aortic atherosclerosis. Aortic Atherosclerosis (ICD10-I70.0). Electronically Signed   By: Lowella Grip III M.D.   On: 07/06/2018 08:39    Cardiac Studies   Echo:  Study Conclusions  - Left ventricle: The cavity size was normal. Wall thickness was increased in a pattern of mild LVH. Systolic function was moderately reduced. The estimated ejection fraction was in the range of 35% to 40%. There is akinesis of the mid-apicalanteroseptal, anterior, anterolateral, lateral, inferolateral, and apical myocardium. Doppler parameters are consistent with abnormal  left ventricular relaxation (grade 1 diastolic dysfunction).  Cath  Widely patent left main.  Segmental 50% LAD narrowing. Large first diagonal with 70% stenosis in the mid to distal vessel.  Large normal ramus intermedius  Circumflex coronary gives origin to 3 tiny obtuse marginal branches. 50-60 % stenosis noted in the mid body of the circumflex.  Dominant right coronary with luminal irregularities.  Apical ballooning identified on left ventriculography consistent with Takotsubo cardiomyopathy  Severe left ventricular systolic dysfunction with EF less than 25% and LVEDP 24 mmHg  Cardiogenic shock  on presentation now improving and patient is off pressor therapy.  Patient Profile     62 y.o. male with moderate nonobstructive CAD and suspected takotsubo syndrome (versus small NSTEMI, troponin 6.5) in the setting of hypotension/hypovolemia, alcohol withdrawal, smoking.  Assessment & Plan    1. NSTEMI (demand) versus takotsubo sd: not conventional type I acute MI. May have had myocardial injury in setting of severe hypotension, ETOH wd. Initial trigger is unclear. Has moderate CAD and plan long term ASA, statin, diet/lifestyle changes, particularly smoking cessation. ECg changes are c/w stress cardiomyopathy. 2. Systolic HF, acute: suspect low EF will be transient, meanwhile support with beta blocker/RAAS inhibitors. BP good, allows titration today. Appears clinically euvolemic, no need for diuretics today. Recheck echo no sooner than one week. 3. Pleuritic pain/pneumonia: symptoms c/w acute pericarditis versus lung problem (suspect latter with productive cough, pneumonia supported by CXR findings). 4. ETOW wd: greatly improved 5. Tobacco abuse: discussed cessation     For questions or updates, please contact [place] Please consult [email or web address] for contact info under        Signed, Sanda Klein, MD  07/06/2018, 9:27 AM

## 2018-07-06 NOTE — Consult Note (Signed)
Palliative Medicine    Name: Oscar Phillips Date: 07/06/2018 MRN: [id number]  DOB: 1956/02/01  Patient Care Team: Janith Lima, MD as PCP - General Chucky May, MD as Consulting Physician (Psychiatry)    REASON FOR CONSULTATION: Palliative Care consult requested for this 62 y.o. male for goals of medical treatment in patient with COPD, OCD, anxiety, chronic alcohol and tobacco abuse, h/o alcohol withdrawal and inpatient detox, who was hospitalized on 07/03/18 with 9 days of generalized fatigue and poor oral intake. He was initially treated for hypovolemic shock with pressors. He underwent cardiac catheterization and was found to have severe CM with EF <25% thought secondary to chronic alcohol abuse vs Takotsubo cardiomyopathy. Palliative care was consulted to help address goals.    SOCIAL HISTORY: Patient lives at home alone. 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 He was never married and has no children. He has a brother and sister who are involved but live out of town. Patient has smoked tobacco since the 9th grade. He has chronically consumed alcohol since he was 20. Patient says he drinks 12 [employer] Hard Lemonade each day. He has a short period of sobriety over the summer (for about 5 weeks) following an inpatient stay at rehab.   ADVANCE DIRECTIVES:  Patient's sister is his 28. He has a living will.   CODE STATUS: DNR  PAST MEDICAL HISTORY: Past Medical History:  Diagnosis Date  . Abdominal bruit   . Alcoholism ([place])   . Anxiety   . B12 deficiency   . Bronchitis   . COPD (chronic obstructive pulmonary disease) ([place])   . GERD (gastroesophageal reflux disease)   . H/O trichomonal urethritis   . History of panic attacks   . History of rectal bleeding    in August 2016  . HLD (hyperlipidemia)   . HTN (hypertension)   . OCD (obsessive compulsive disorder)   . PSA elevation     PAST SURGICAL HISTORY:  Past Surgical History:    Procedure Laterality Date  . ELBOW FRACTURE SURGERY Left   . LEFT HEART CATH AND CORONARY ANGIOGRAPHY N/A 07/04/2018   Procedure: LEFT HEART CATH AND CORONARY ANGIOGRAPHY;  Surgeon: Belva Crome, MD;  Location: [facility];  Service: Cardiovascular;  Laterality: N/A;  . LUMBAR [place]    . TONSILLECTOMY  1965    HEMATOLOGY/ONCOLOGY HISTORY:   No history exists.    ALLERGIES:  is allergic to penicillins.  MEDICATIONS:  Current Facility-Administered Medications  Medication Dose Route Frequency Provider Last Rate Last Dose  . 0.9 %  sodium chloride infusion  250 mL Intravenous PRN Belva Crome, MD 10 mL/hr at 07/05/18 0400    . acetaminophen (TYLENOL) tablet 650 mg  650 mg Oral Q4H PRN Belva Crome, MD      . atorvastatin (LIPITOR) tablet 80 mg  80 mg Oral q1800 Belva Crome, MD   80 mg at 07/05/18 1712  . chlorhexidine (PERIDEX) 0.12 % solution 15 mL  15 mL Mouth Rinse BID Olalere, Adewale A, MD   15 mL at 07/05/18 2254  . folic acid (FOLVITE) tablet 1 mg  1 mg Oral Daily Belva Crome, MD   1 mg at 07/05/18 1227  . heparin injection 5,000 Units  5,000 Units Subcutaneous Q8H Belva Crome, MD   5,000 Units at 07/06/18 [phone]  . ipratropium-albuterol (DUONEB) 0.5-2.5 (3) MG/3ML nebulizer solution 3 mL  3 mL Nebulization Q6H PRN Tamala Julian,  Lynnell Dike, MD   3 mL at 07/04/18 0322  . lisinopril (PRINIVIL,ZESTRIL) tablet 10 mg  10 mg Oral Daily Kara Mead V, MD      . LORazepam (ATIVAN) injection 2-3 mg  2-3 mg Intravenous Q1H PRN Belva Crome, MD   3 mg at 07/05/18 0612  . MEDLINE mouth rinse  15 mL Mouth Rinse q12n4p Olalere, Adewale A, MD   15 mL at 07/05/18 1714  . metoprolol succinate (TOPROL-XL) 24 hr tablet 50 mg  50 mg Oral Daily Jimmy Footman S, [place]      . multivitamin with minerals tablet 1 tablet  1 tablet Oral Daily Belva Crome, MD   1 tablet at 07/05/18 1223  . nicotine (NICODERM CQ - dosed in mg/24 hours) patch 14 mg  14 mg Transdermal Daily Belva Crome,  MD   14 mg at 07/05/18 1222  . ondansetron (ZOFRAN) injection 4 mg  4 mg Intravenous Q6H PRN Belva Crome, MD      . sodium chloride 0.9 % bolus 1,000 mL  1,000 mL Intravenous Once Belva Crome, MD      . sodium chloride flush (NS) 0.9 % injection 3 mL  3 mL Intravenous Q12H Belva Crome, MD   3 mL at 07/05/18 2255  . sodium chloride flush (NS) 0.9 % injection 3 mL  3 mL Intravenous PRN Belva Crome, MD      . thiamine (VITAMIN B-1) tablet 100 mg  100 mg Oral Daily Belva Crome, MD   100 mg at 07/05/18 1225  . tiotropium (SPIRIVA) inhalation capsule 18 mcg  1 capsule Inhalation Daily Belva Crome, MD   18 mcg at 07/05/18 1228    VITAL SIGNS: BP (!) 136/57   Pulse 90   Temp 98 F (36.7 C) (Oral)   Resp 19   Ht 5\' 9"  (1.753 m)   Wt 71.2 kg   SpO2 99%   BMI 23.18 kg/m  Filed Weights   07/03/18 2025 07/05/18 0100 07/06/18 0300  Weight: 65.3 kg 68.3 kg 71.2 kg    Estimated body mass index is 23.18 kg/m as calculated from the following:   Height as of this encounter: 5\' 9"  (1.753 m).   Weight as of this encounter: 71.2 kg.  LABS: CBC:    Component Value Date/Time   WBC 8.7 07/06/2018 0257   HGB 12.4 (L) 07/06/2018 0257   HCT 36.9 (L) 07/06/2018 0257   PLT 156 07/06/2018 0257   MCV 93.9 07/06/2018 0257   NEUTROABS 9.4 (H) 07/03/2018 1506   LYMPHSABS 2.1 07/03/2018 1506   MONOABS 1.1 (H) 07/03/2018 1506   EOSABS 0.0 07/03/2018 1506   BASOSABS 0.0 07/03/2018 1506   Comprehensive Metabolic Panel:    Component Value Date/Time   NA 130 (L) 07/06/2018 0257   K 3.7 07/06/2018 0257   CL 101 07/06/2018 0257   CO2 21 (L) 07/06/2018 0257   BUN 9 07/06/2018 0257   CREATININE 0.83 07/06/2018 0257   GLUCOSE 100 (H) 07/06/2018 0257   CALCIUM 8.2 (L) 07/06/2018 0257   AST 71 (H) 07/06/2018 0257   ALT 71 (H) 07/06/2018 0257   ALKPHOS 50 07/06/2018 0257   BILITOT 0.9 07/06/2018 0257   PROT 5.3 (L) 07/06/2018 0257   ALBUMIN 2.7 (L) 07/06/2018 0257    RADIOGRAPHIC  STUDIES: US Abdomen Complete  Result Date: 07/04/2018 CLINICAL DATA:  Abdominal pain EXAM: ABDOMEN ULTRASOUND COMPLETE COMPARISON:  03/02/2018 FINDINGS: Gallbladder: Normally distended without  stones or wall thickening. No pericholecystic fluid or sonographic Murphy sign. Common bile duct: Diameter: Normal caliber 3 mm diameter Liver: Echogenic parenchyma, likely fatty infiltration though this can be seen with cirrhosis and certain infiltrative disorders. No focal hepatic mass or nodularity. Portal vein is patent on color Doppler imaging with normal direction of blood flow towards the liver. IVC: Normal appearance Pancreas: Portions of the pancreatic head through mid tail normal appearance, remainder obscured by bowel gas. Spleen: Normal appearance, 5.5 cm length Right Kidney: Length: 12.6 cm. Normal morphology without mass or hydronephrosis. Left Kidney: Length: 11.1 cm. Normal morphology without mass or hydronephrosis. Abdominal aorta: Proximal and mid portions are normal in caliber. Distally the aorta is obscured by bowel gas. Other findings: No free fluid IMPRESSION: Incomplete pancreatic and aortic visualization. Probable fatty infiltration of liver as above. No acute abnormalities otherwise identified. Electronically Signed   By: Lavonia Dana M.D.   On: 07/04/2018 16:28   Dg Chest Port 1 View  Result Date: 07/06/2018 CLINICAL DATA:  Respiratory failure EXAM: PORTABLE CHEST 1 VIEW COMPARISON:  July 03, 2018 FINDINGS: There is airspace consolidation throughout much of the left upper lobe. There is more patchy opacity in the right mid lung region. There is interstitial prominence with probable mild pulmonary edema in the bases. Heart is borderline enlarged with pulmonary vascularity normal. No adenopathy. There is aortic atherosclerosis. No bone lesions. IMPRESSION: Airspace consolidation throughout much of the left upper lobe and to a much lesser degree in the right mid lung, likely foci of pneumonia.  Suspect mild basilar interstitial edema. Stable cardiac prominence. There is aortic atherosclerosis. Aortic Atherosclerosis (ICD10-I70.0). Electronically Signed   By: Lowella Grip III M.D.   On: 07/06/2018 08:39   Dg Chest Portable 1 View  Result Date: 07/03/2018 CLINICAL DATA:  Syncopal episode, hypertension EXAM: PORTABLE CHEST 1 VIEW COMPARISON:  04/17/2018 FINDINGS: Defibrillator pads overlie the chest. Normal heart size and vascularity. Mild hyperinflation, suspect COPD/emphysema. Negative for edema, effusion, collapse, consolidation, or pneumothorax. Trachea is midline. Aorta is atherosclerotic. Bones are osteopenic IMPRESSION: No active disease. Electronically Signed   By: Jerilynn Mages.  Shick M.D.   On: 07/03/2018 16:50    PERFORMANCE STATUS (ECOG) : 1 - Symptomatic but completely ambulatory  Review of Systems As noted above. Otherwise, a complete review of systems is negative.  Physical Exam General: Well-developed, well-nourished, no acute distress. Eyes: Pink conjunctiva, anicteric sclera. HEENT: Normocephalic, moist mucous membranes, clear oropharnyx. Lungs: CTA ant fields Heart: Regular rate and rhythm. No rubs, murmurs, or gallops. Abdomen: Soft, nontender, nondistended. No organomegaly noted, normoactive bowel sounds. Musculoskeletal: No edema, cyanosis, or clubbing. Neuro: Alert, answering all questions appropriately. Cranial nerves grossly intact. Skin: No rashes or petechiae noted. Psych: Normal affect.  IMPRESSION: I had a lengthy meeting with patient and reviewed with him his social/medical history and his medical goals.  This is patient's third hospitalization for alcohol related issues in the past 4 months.  He had a short stay at rehab and behavioral health in June 2019, and he reports that this resulted in a period of sobriety lasting approximately 5-1/2 weeks.  Patient identified a trigger related to his brother that resulted in relapse with sustained daily alcohol use.   Patient is able to identify several triggers for continued alcohol use such as his past history of anxiety and OCD and also his social isolation.  He lives at home alone and says that since retirement last year, he has occupied his time with drinking alcohol.  He tried a support group through the AA and attended 2 meetings but stopped regularly attending.  Patient is not currently established with either community psychiatry or psychotherapy services.  I strongly encouraged patient to consider establishing care under a psychiatrist in addition to therapy and a support group.  We talked about patient's current medical problems.  He was aware of the results of his cardiac catheterization.  He verbalized his intent to maintain sobriety.  He is in agreement with the current treatment plan.  We talked about CODE STATUS and advanced care planning.  He says his sister is his healthcare power of attorney.  He has a living will.  He says that he has talked to his sister about his wishes regarding healthcare decisions and that he would not want any attempts at resuscitation or to have his life prolonged artificially.  He says that it is his wish to be a DO NOT RESUSCITATE.  I will change his CODE STATUS to reflect this wish.  I tried calling his sister but had difficulty talking with her due to her poor cell reception.  She says that she is on the way to the hospital and I will speak with her when she arrives.  I would recommend that patient is followed in the community by palliative care.  He would particularly benefit from a Education officer, museum following him in the home.  PLAN: 1. DNR 2. Recommend community palliative care services including a home SW 3. Disposition unclear. Would benefit from PT evaluation.  4. Recommend outpatient psychiatric evaluation and psychotherapy.  5. Consult inpatient SW 6. Will speak with sister when she arrives  Patient expressed understanding and was in agreement with this plan.    Time In: 0800 Time Out: 0900 Time Total: 60 minutes  Visit consisted of counseling and education dealing with the complex and emotionally intense issues of symptom management and palliative care in the setting of serious and potentially life-threatening illness.Greater than 50%  of this time was spent counseling and coordinating care related to the above assessment and plan.  Signed by: Altha Harm, [place], NP-C, [place] (Work Cell)

## 2018-07-06 NOTE — Progress Notes (Signed)
62 year old man, heavy drinker, smoker admitted 9/25 with fatigue malaise, decreased appetite and hypotension improved with fluids and pressors. Echo and cardiac cath confirmed cardiomyopathy, no significant coronary artery disease. He developed severe alcohol withdrawal requiring transient Precedex infusion which is now off.  On exam-out of bed, alert and interactive, no tremors, no JVD, no edema, S1-S2 normal no S3, clear lungs.  Chest x-ray personally reviewed which shows cardiomegaly, faint left mid zone infiltrate.  Labs reviewed which show normal electrolytes, peak troponin of 6.5 which is now come down to 0.9, decreasing leukocytosis.  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  Alcohol withdrawal-resolved, off Precedex, can use Ativan as needed.  Left mid zone infiltrate, could be fluid, he does have yellow sputum we will give doxycycline for 5 days.  He understands the importance of alcohol abstinence and smoking cessation  Transferred to telemetry and to triad 9/29  Kara Mead MD. [facility]. [facility] Pager [phone] If no response call [phone]   07/06/2018

## 2018-07-06 NOTE — Progress Notes (Signed)
I met with patient and his sister.  I reviewed with them patient's current medical problems and we had a discussion about his history of alcohol use.  Sister confirms that she is his healthcare power of attorney.  She also confirms that he has a living will and would not want to be resuscitated.  She intends to speak with him regarding his wishes for future medical decisions.  She would be interested in him having outpatient psychiatric follow-up.  She states her primary goal for patient would be for him to maintain sobriety.  Both patient and family seem to recognize the potential impact of continued alcohol use.

## 2018-07-07 DIAGNOSIS — F1027 Alcohol dependence with alcohol-induced persisting dementia: Secondary | ICD-10-CM

## 2018-07-07 DIAGNOSIS — L8990 Pressure ulcer of unspecified site, unspecified stage: Secondary | ICD-10-CM

## 2018-07-07 LAB — BASIC METABOLIC PANEL
ANION GAP: 9 (ref 5–15)
BUN: 9 mg/dL (ref 8–23)
CALCIUM: 7.9 mg/dL — AB (ref 8.9–10.3)
CO2: 22 mmol/L (ref 22–32)
CREATININE: 0.8 mg/dL (ref 0.61–1.24)
Chloride: 97 mmol/L — ABNORMAL LOW (ref 98–111)
GFR calc non Af Amer: >60 mL/min (ref >60)
Glucose, Bld: 106 mg/dL — ABNORMAL HIGH (ref 70–99)
Potassium: 3.1 mmol/L — ABNORMAL LOW (ref 3.5–5.1)
SODIUM: 128 mmol/L — AB (ref 135–145)

## 2018-07-07 LAB — URINALYSIS, ROUTINE W REFLEX MICROSCOPIC
Bacteria, UA: NONE SEEN
Bilirubin Urine: NEGATIVE
Glucose, UA: NEGATIVE mg/dL
Ketones, ur: NEGATIVE mg/dL
Leukocytes, UA: NEGATIVE
Nitrite: NEGATIVE
PROTEIN: NEGATIVE mg/dL
Specific Gravity, Urine: 1.006 (ref 1.005–1.030)
pH: 7 (ref 5.0–8.0)

## 2018-07-07 LAB — SODIUM, URINE, RANDOM
SODIUM UR: 29 mmol/L
SODIUM UR: 30 mmol/L

## 2018-07-07 LAB — VITAMIN B12: Vitamin B-12: 309 pg/mL (ref 180–914)

## 2018-07-07 LAB — FOLATE: Folate: 47.7 ng/mL (ref >5.9)

## 2018-07-07 LAB — RAPID URINE DRUG SCREEN, HOSP PERFORMED
AMPHETAMINES: NOT DETECTED
BENZODIAZEPINES: NOT DETECTED
Barbiturates: NOT DETECTED
Cocaine: NOT DETECTED
Opiates: NOT DETECTED
Tetrahydrocannabinol: NOT DETECTED

## 2018-07-07 LAB — OSMOLALITY: OSMOLALITY: 265 mosm/kg — AB (ref 275–295)

## 2018-07-07 LAB — OSMOLALITY, URINE: Osmolality, Ur: 248 mOsm/kg — ABNORMAL LOW (ref 300–900)

## 2018-07-07 LAB — VITAMIN B1: Vitamin B1 (Thiamine): 140.5 nmol/L (ref 66.5–200.0)

## 2018-07-07 MED ORDER — THIAMINE HCL 100 MG/ML IJ SOLN: 100.0000 mg | Freq: Every day | INTRAMUSCULAR | Status: DC

## 2018-07-07 MED ORDER — DOXYCYCLINE HYCLATE 100 MG PO TABS
100.0000 mg | ORAL_TABLET | Freq: Two times a day (BID) | ORAL | Status: DC
  Administered 2018-07-07 – 2018-07-09 (×5): 100 mg via ORAL
  Filled 2018-07-07 (×5): qty 1

## 2018-07-07 MED ORDER — IPRATROPIUM BROMIDE 0.02 % IN SOLN
0.5000 mg | Freq: Four times a day (QID) | RESPIRATORY_TRACT | Status: DC
  Administered 2018-07-07 – 2018-07-08 (×6): 0.5 mg via RESPIRATORY_TRACT
  Filled 2018-07-07 (×7): qty 2.5

## 2018-07-07 MED ORDER — FOLIC ACID 1 MG PO TABS: 1.0000 mg | ORAL_TABLET | Freq: Every day | ORAL | Status: DC

## 2018-07-07 MED ORDER — ADULT MULTIVITAMIN W/MINERALS CH: 1.0000 | ORAL_TABLET | Freq: Every day | ORAL | Status: DC

## 2018-07-07 MED ORDER — INFLUENZA VAC SPLIT QUAD 0.5 ML IM SUSY: 0.5000 mL | PREFILLED_SYRINGE | INTRAMUSCULAR | Status: DC | PRN

## 2018-07-07 MED ORDER — PREDNISONE 20 MG PO TABS
40.0000 mg | ORAL_TABLET | Freq: Every day | ORAL | Status: DC
  Administered 2018-07-07 – 2018-07-09 (×3): 40 mg via ORAL
  Filled 2018-07-07 (×3): qty 2

## 2018-07-07 MED ORDER — VITAMIN B-1 100 MG PO TABS: 100.0000 mg | ORAL_TABLET | Freq: Every day | ORAL | Status: DC

## 2018-07-07 MED ORDER — LORAZEPAM 1 MG PO TABS
1.0000 mg | ORAL_TABLET | Freq: Four times a day (QID) | ORAL | Status: DC | PRN
  Administered 2018-07-07: 1 mg via ORAL
  Filled 2018-07-07: qty 1

## 2018-07-07 MED ORDER — POTASSIUM CHLORIDE CRYS ER 20 MEQ PO TBCR
40.0000 meq | EXTENDED_RELEASE_TABLET | Freq: Once | ORAL | Status: AC
  Administered 2018-07-07: 40 meq via ORAL
  Filled 2018-07-07: qty 2

## 2018-07-07 MED ORDER — FLUTICASONE FUROATE-VILANTEROL 100-25 MCG/INH IN AEPB
1.0000 | INHALATION_SPRAY | Freq: Every day | RESPIRATORY_TRACT | Status: DC
  Administered 2018-07-08: 1 via RESPIRATORY_TRACT
  Filled 2018-07-07 (×2): qty 28

## 2018-07-07 MED ORDER — LORAZEPAM 2 MG/ML IJ SOLN: 1.0000 mg | Freq: Four times a day (QID) | INTRAMUSCULAR | Status: DC | PRN

## 2018-07-07 MED ORDER — LEVALBUTEROL HCL 0.63 MG/3ML IN NEBU
0.6300 mg | INHALATION_SOLUTION | Freq: Four times a day (QID) | RESPIRATORY_TRACT | Status: DC
  Administered 2018-07-07 – 2018-07-08 (×6): 0.63 mg via RESPIRATORY_TRACT
  Filled 2018-07-07 (×7): qty 3

## 2018-07-07 MED ORDER — MOMETASONE FURO-FORMOTEROL FUM 100-5 MCG/ACT IN AERO: 2.0000 | INHALATION_SPRAY | Freq: Two times a day (BID) | RESPIRATORY_TRACT | Status: DC

## 2018-07-07 MED ORDER — LEVALBUTEROL HCL 0.63 MG/3ML IN NEBU: 0.6300 mg | INHALATION_SOLUTION | RESPIRATORY_TRACT | Status: DC | PRN

## 2018-07-07 NOTE — Progress Notes (Signed)
Pt has unsteady gait. Pt stated he dose not feel weaker or one side. Grips symmetrical no tremors noted. Pt denies any dizziness.  Pt ambulated in the hall using a walker. 02 sats dropped down to 92%. Md aware. Physical therapy ordered. Will cont to monitor pt.

## 2018-07-07 NOTE — Progress Notes (Signed)
Progress Note  Patient Name: Oscar Phillips Date of Encounter: 07/07/2018  Primary Cardiologist: Minus Breeding, MD   Subjective   Lying flat.  Denies shortness of breath and chest pain.  Inpatient Medications    Scheduled Meds: . atorvastatin  80 mg Oral q1800  . chlorhexidine  15 mL Mouth Rinse BID  . folic acid  1 mg Oral Daily  . heparin  5,000 Units Subcutaneous Q8H  . lisinopril  10 mg Oral Daily  . mouth rinse  15 mL Mouth Rinse q12n4p  . metoprolol succinate  50 mg Oral Daily  . multivitamin with minerals  1 tablet Oral Daily  . nicotine  14 mg Transdermal Daily  . sodium chloride flush  3 mL Intravenous Q12H  . thiamine  100 mg Oral Daily  . tiotropium  1 capsule Inhalation Daily   Continuous Infusions: . sodium chloride 10 mL/hr at 07/05/18 0400  . sodium chloride     PRN Meds: sodium chloride, acetaminophen, ipratropium-albuterol, LORazepam, ondansetron (ZOFRAN) IV, sodium chloride flush   Vital Signs    Vitals:   07/06/18 2001 07/07/18 0130 07/07/18 0449 07/07/18 0816  BP: 117/75 132/77 128/81 124/76  Pulse: 90 (!) 105 (!) 102 99  Resp: 19  (!) 22   Temp: 98.3 F (36.8 C) 98.6 F (37 C) 97.7 F (36.5 C) 98.8 F (37.1 C)  TempSrc: Oral Oral Oral Oral  SpO2: 94% 94% 94% 94%  Weight:      Height:        Intake/Output Summary (Last 24 hours) at 07/07/2018 0846 Last data filed at 07/07/2018 0518 Gross per 24 hour  Intake 600 ml  Output 995 ml  Net -395 ml   Filed Weights   07/03/18 2025 07/05/18 0100 07/06/18 0300  Weight: 65.3 kg 68.3 kg 71.2 kg    Telemetry    Sinus rhythm/tachycardia- Personally Reviewed  ECG    Most recently on 07/04/2018 reveals right bundle, left anterior hemiblock, and precordial T wave abnormality.- Personally Reviewed  Physical Exam  Good skin color GEN: No acute distress.   Neck: No JVD Cardiac: RRR, no murmurs, rubs, or gallops.  Respiratory: Clear to auscultation bilaterally. GI: Soft, nontender,  non-distended  MS: No edema; No deformity. Neuro:  Nonfocal  Psych: Normal affect   Labs    Chemistry Recent Labs  Lab 07/03/18 1506  07/05/18 0256 07/06/18 0257 07/07/18 0338  NA 126*   < > 132* 130* 128*  K 2.9*   < > 3.8 3.7 3.1*  CL 88*   < > 104 101 97*  CO2 24   < > 21* 21* 22  GLUCOSE 170*   < > 92 100* 106*  BUN 11   < > 9 9 9   CREATININE 1.65*   < > 0.75 0.83 0.80  CALCIUM 8.7*   < > 8.0* 8.2* 7.9*  PROT 6.6  --   --  5.3*  --   ALBUMIN 3.8  --   --  2.7*  --   AST 87*  --   --  71*  --   ALT 94*  --   --  71*  --   ALKPHOS 60  --   --  50  --   BILITOT 1.8*  --   --  0.9  --   GFRNONAA 43*   < > >60 >60 >60  GFRAA 50*   < > >60 >60 >60  ANIONGAP 14   < > 7 8 9    < > =  values in this interval not displayed.     Hematology Recent Labs  Lab 07/03/18 1506 07/04/18 0327 07/06/18 0257  WBC 12.6* 11.9* 8.7  RBC 4.75 4.37 3.93*  HGB 15.3 14.0 12.4*  HCT 43.7 41.2 36.9*  MCV 92.0 94.3 93.9  MCH 32.2 32.0 31.6  MCHC 35.0 34.0 33.6  RDW 13.0 12.8 12.8  PLT 216 160 156    Cardiac Enzymes Recent Labs  Lab 07/03/18 2141 07/04/18 0220 07/04/18 1017 07/06/18 0257  TROPONINI 5.86* 6.47* 4.63* 0.97*    Recent Labs  Lab 07/03/18 1501  TROPIPOC 5.36*     BNP Recent Labs  Lab 07/06/18 0257  BNP 1,708.4*     DDimer No results for input(s): DDIMER in the last 168 hours.   Radiology    Dg Chest Port 1 View  Result Date: 07/06/2018 CLINICAL DATA:  Respiratory failure EXAM: PORTABLE CHEST 1 VIEW COMPARISON:  July 03, 2018 FINDINGS: There is airspace consolidation throughout much of the left upper lobe. There is more patchy opacity in the right mid lung region. There is interstitial prominence with probable mild pulmonary edema in the bases. Heart is borderline enlarged with pulmonary vascularity normal. No adenopathy. There is aortic atherosclerosis. No bone lesions. IMPRESSION: Airspace consolidation throughout much of the left upper lobe and to a  much lesser degree in the right mid lung, likely foci of pneumonia. Suspect mild basilar interstitial edema. Stable cardiac prominence. There is aortic atherosclerosis. Aortic Atherosclerosis (ICD10-I70.0). Electronically Signed   By: Lowella Grip III M.D.   On: 07/06/2018 08:39    Cardiac Studies   Cardiac catheterization 07/04/2018:   Widely patent left main.  Segmental 50% LAD narrowing.  Large first diagonal with 70% stenosis in the mid to distal vessel.  Large normal ramus intermedius  Circumflex coronary gives origin to 3 tiny obtuse marginal branches.  50-60 % stenosis noted in the mid body of the circumflex.  Dominant right coronary with luminal irregularities.  Apical ballooning identified on left ventriculography consistent with Takotsubo cardiomyopathy  Severe left ventricular systolic dysfunction with EF less than 25% and LVEDP 24 mmHg  Cardiogenic shock on presentation now improving and patient is off pressor therapy.   RECOMMENDATIONS:    Convert heparin to DVT prophylaxis.  Renin-angiotensin and sympathetic nervous system blockade as tolerated by blood pressure to facilitate LV recovery.  Diuresis as needed to prevent pulmonary congestion.  Alcohol cessation      Patient Profile     62 y.o. male  with moderate nonobstructive CAD and suspected takotsubo syndrome (versus small NSTEMI, troponin 6.5) in the setting of hypotension/hypovolemia, alcohol withdrawal, and smoking.  Assessment & Plan    1. 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  Currently no evidence of volume overload/CHF.  Repeat EKG today.  Currently on ACE inhibitor therapy and Toprol.  Cardiac rehab phase 1. 2. Acute systolic heart failure with no clinical evidence of volume overload currently 3. Alcohol withdrawal -seems well compensated     For questions or updates, please contact [place] Please consult [email or web address] for contact info under         Signed, Sinclair Grooms, MD  07/07/2018, 8:46 AM

## 2018-07-07 NOTE — Progress Notes (Addendum)
[facility]                                                                              Patient Demographics  Oscar Phillips, is a 62 y.o. male, DOB - 1956/01/16, JEH:[id number]  Admit date - 07/03/2018   Admitting Physician Laurin Coder, MD  Outpatient Primary MD for the patient is Janith Lima, MD  Outpatient specialists:   LOS - 4  days   Medical records reviewed and are as summarized below:    Chief Complaint  Patient presents with  . Loss of Consciousness  . Hypotension       Brief summary   Patient is a 62 year old male with a history of tobacco abuse, COPD, alcoholism presented with 9-day history of generalized fatigue, not been eating for a week with no appetite, diarrhea.  Patient was hypotensive in the ED, presented severely hypovolemic.  BP improved after fluid resuscitation and vasopressors but eventually weaned off after 6 L of fluids.  He was found to have acute kidney injury, elevated troponins.  Cardiology was consulted.  2D echo and cardiac cath confirmed cardiomyopathy but no obstructive CAD.  Patient also developed severe alcohol withdrawals requiring transient Precedex infusion in ICU. Patient is now transferred to hospitalist service, assumed care on 9/29.   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 -Cardiac cath showed severe left ventricular systolic dysfunction, EF less than 25%, no obstructive CAD consistent with Takotsubo's cardiomyopathy  Active Problems: Hypovolemic shock, hypotension -Improved, BP currently stable  Acute systolic and diastolic CHF -2D echo with EF of 35 to 40% with grade 1 diastolic  dysfunction.  Cardiac cath with no obstructive CAD. -Currently stable, no evidence of volume overload -Cardiology following  Alcohol withdrawals -Patient required Precedex drip in ICU, currently stable, no agitation, continue CIWA scale with Ativan -Obtain B12, folate, B1 levels  COPD with exacerbation -On exam, wheezing, states he has known diagnosis of COPD emphysema -Patient also coughing, chest x-ray on 9/28 had shown airspace and consolidation throughout much of the left upper lobe and right midlung likely foci of pneumonia -Placed on doxycycline. -Patient placed on scheduled Xopenex and Atrovent nebs, prednisone 40 mg daily, breo inhaler  Nicotine abuse Recommended strongly to quit smoking, continue nicotine patch      Pressure injury of skin Stage I left heel, wound care per nursing  Hyponatremia Obtain serum osmolarity, urine osmolarity, UNA,  Code Status: DNR DVT Prophylaxis: Heparin subcu Family Communication: Discussed in detail with the patient, all imaging results, lab results explained to the patient    Disposition Plan: Currently wheezing, placed on scheduled nebs, obtain home O2 evaluation, PT OT.  Possible DC next 24 to 48 hours  Time Spent in minutes 35 minutes  Procedures:   Cardiac catheterization 07/04/2018:   Widely patent left main.  Segmental 50% LAD narrowing. Large first diagonal with 70%  stenosis in the mid to distal vessel.  Large normal ramus intermedius  Circumflex coronary gives origin to 3 tiny obtuse marginal branches. 50-60 % stenosis noted in the mid body of the circumflex.  Dominant right coronary with luminal irregularities.  Apical ballooning identified on left ventriculography consistent with Takotsubo cardiomyopathy  Severe left ventricular systolic dysfunction with EF less than 25% and LVEDP 24 mmHg   Consultants:   Cardiology CCM  Antimicrobials:   Doxycycline 9/29   Medications  Scheduled Meds: .  atorvastatin  80 mg Oral q1800  . chlorhexidine  15 mL Mouth Rinse BID  . doxycycline  100 mg Oral Q12H  . fluticasone furoate-vilanterol  1 puff Inhalation Daily  . folic acid  1 mg Oral Daily  . heparin  5,000 Units Subcutaneous Q8H  . levalbuterol  0.63 mg Nebulization Q6H   And  . ipratropium  0.5 mg Nebulization Q6H  . lisinopril  10 mg Oral Daily  . mouth rinse  15 mL Mouth Rinse q12n4p  . metoprolol succinate  50 mg Oral Daily  . multivitamin with minerals  1 tablet Oral Daily  . nicotine  14 mg Transdermal Daily  . predniSONE  40 mg Oral Q breakfast  . sodium chloride flush  3 mL Intravenous Q12H  . thiamine  100 mg Oral Daily   Continuous Infusions: . sodium chloride 10 mL/hr at 07/05/18 0400  . sodium chloride     PRN Meds:.sodium chloride, acetaminophen, levalbuterol, LORazepam **OR** LORazepam, ondansetron (ZOFRAN) IV, sodium chloride flush   Antibiotics   Anti-infectives (From admission, onward)   Start     Dose/Rate Route Frequency Ordered Stop   07/07/18 1030  doxycycline (VIBRA-TABS) tablet 100 mg     100 mg Oral Every 12 hours 07/07/18 0940     07/03/18 1630  vancomycin (VANCOCIN) 1,250 mg in sodium chloride 0.9 % 250 mL IVPB     1,250 mg 166.7 mL/hr over 90 Minutes Intravenous  Once 07/03/18 1536 07/03/18 1816   07/03/18 1515  ceFEPIme (MAXIPIME) 2 g in sodium chloride 0.9 % 100 mL IVPB     2 g 200 mL/hr over 30 Minutes Intravenous  Once 07/03/18 1507 07/03/18 1600        Subjective:   Oscar Phillips was seen and examined today.  Coughing, wheezing.  No fevers or chills.  No chest pain.  Patient denies dizziness, abdominal pain, N/V/D/C, new weakness, numbess, tingling. No acute events overnight.    Objective:   Vitals:   07/07/18 0130 07/07/18 0449 07/07/18 0816 07/07/18 1055  BP: 132/77 128/81 124/76   Pulse: (!) 105 (!) 102 99 (!) 101  Resp:  (!) 22  20  Temp: 98.6 F (37 C) 97.7 F (36.5 C) 98.8 F (37.1 C)   TempSrc: Oral Oral Oral     SpO2: 94% 94% 94% 93%  Weight:      Height:        Intake/Output Summary (Last 24 hours) at 07/07/2018 1250 Last data filed at 07/07/2018 0518 Gross per 24 hour  Intake 600 ml  Output 995 ml  Net -395 ml     Wt Readings from Last 3 Encounters:  07/06/18 71.2 kg  04/17/18 67.1 kg  04/01/18 65.8 kg     Exam  General: Alert and oriented x 3, NAD  Eyes:   HEENT:  Atraumatic, normocephalic  Cardiovascular: S1 S2 auscultated,  Regular rate and rhythm.  Respiratory: Bilateral expiratory wheezing  Gastrointestinal: Soft, nontender, nondistended, + bowel sounds  Ext: no pedal edema bilaterally  Neuro: no new deficits  Musculoskeletal: No digital cyanosis, clubbing  Skin: No rashes  Psych: Normal affect and demeanor, alert and oriented x3    Data Reviewed:  I have personally reviewed following labs and imaging studies  Micro Results Recent Results (from the past 240 hour(s))  Blood culture (routine x 2)     Status: None (Preliminary result)   Collection Time: 07/03/18  3:07 PM  Result Value Ref Range Status   Specimen Description   Final    BLOOD LEFT ANTECUBITAL Performed at [facility] [address], [place], [place] [address]    Special Requests   Final    BOTTLES DRAWN AEROBIC AND ANAEROBIC Blood Culture adequate volume Performed at [facility] [address], [place], [place] [address]    Culture   Final    NO GROWTH 4 DAYS Performed at [facility], [place] [address], [place], [place] [address]    Report Status PENDING  Incomplete  Blood culture (routine x 2)     Status: None (Preliminary result)   Collection Time: 07/03/18  3:13 PM  Result Value Ref Range Status   Specimen Description   Final    BLOOD LEFT FOREARM Performed at [facility] [address] [place], [place] [address]    Special Requests   Final    BOTTLES DRAWN AEROBIC AND ANAEROBIC Blood Culture adequate  volume Performed at [facility] [address] [place], [place] [address]    Culture   Final    NO GROWTH 4 DAYS Performed at [facility], [place] [address] [place], [place] [address]    Report Status PENDING  Incomplete  MRSA PCR Screening     Status: None   Collection Time: 07/04/18  1:06 AM  Result Value Ref Range Status   MRSA by PCR NEGATIVE NEGATIVE Final    Comment:        The GeneXpert MRSA Assay (FDA approved for NASAL specimens only), is one component of a comprehensive MRSA colonization surveillance program. It is not intended to diagnose MRSA infection nor to guide or monitor treatment for MRSA infections. Performed at [facility], [place] [address] [place], [place] [address]     Radiology Reports US Abdomen Complete  Result Date: 07/04/2018 CLINICAL DATA:  Abdominal pain EXAM: ABDOMEN ULTRASOUND COMPLETE COMPARISON:  03/02/2018 FINDINGS: Gallbladder: Normally distended without stones or wall thickening. No pericholecystic fluid or sonographic Murphy sign. Common bile duct: Diameter: Normal caliber 3 mm diameter Liver: Echogenic parenchyma, likely fatty infiltration though this can be seen with cirrhosis and certain infiltrative disorders. No focal hepatic mass or nodularity. Portal vein is patent on color Doppler imaging with normal direction of blood flow towards the liver. IVC: Normal appearance Pancreas: Portions of the pancreatic head through mid tail normal appearance, remainder obscured by bowel gas. Spleen: Normal appearance, 5.5 cm length Right Kidney: Length: 12.6 cm. Normal morphology without mass or hydronephrosis. Left Kidney: Length: 11.1 cm. Normal morphology without mass or hydronephrosis. Abdominal aorta: Proximal and mid portions are normal in caliber. Distally the aorta is obscured by bowel gas. Other findings: No free fluid IMPRESSION: Incomplete pancreatic and aortic visualization. Probable fatty infiltration of liver as  above. No acute abnormalities otherwise identified. Electronically Signed   By: Lavonia Dana M.D.   On: 07/04/2018 16:28   Dg Chest Port 1 View  Result Date: 07/06/2018 CLINICAL DATA:  Respiratory failure EXAM: PORTABLE CHEST 1 VIEW COMPARISON:  July 03, 2018 FINDINGS: There is airspace consolidation throughout much of the left upper lobe. There is more patchy opacity in the right mid lung region. There is interstitial prominence with probable mild pulmonary edema in the bases. Heart is borderline enlarged with pulmonary vascularity normal. No adenopathy. There is aortic atherosclerosis. No bone lesions. IMPRESSION: Airspace consolidation throughout much of the left upper lobe and to a much lesser degree in the right mid lung, likely foci of pneumonia. Suspect mild basilar interstitial edema. Stable cardiac prominence. There is aortic atherosclerosis. Aortic Atherosclerosis (ICD10-I70.0). Electronically Signed   By: Lowella Grip III M.D.   On: 07/06/2018 08:39   Dg Chest Portable 1 View  Result Date: 07/03/2018 CLINICAL DATA:  Syncopal episode, hypertension EXAM: PORTABLE CHEST 1 VIEW COMPARISON:  04/17/2018 FINDINGS: Defibrillator pads overlie the chest. Normal heart size and vascularity. Mild hyperinflation, suspect COPD/emphysema. Negative for edema, effusion, collapse, consolidation, or pneumothorax. Trachea is midline. Aorta is atherosclerotic. Bones are osteopenic IMPRESSION: No active disease. Electronically Signed   By: Jerilynn Mages.  Shick M.D.   On: 07/03/2018 16:50    Lab Data:  CBC: Recent Labs  Lab 07/03/18 1502 07/03/18 1506 07/04/18 0327 07/06/18 0257  WBC  --  12.6* 11.9* 8.7  NEUTROABS  --  9.4*  --   --   HGB 15.6 15.3 14.0 12.4*  HCT 46.0 43.7 41.2 36.9*  MCV  --  92.0 94.3 93.9  PLT  --  216 160 585   Basic Metabolic Panel: Recent Labs  Lab 07/03/18 1506 07/03/18 1510 07/04/18 0705 07/04/18 1017 07/05/18 0256 07/06/18 0257 07/07/18 0338  NA 126*  --  133*  --   132* 130* 128*  K 2.9*  --  2.9* 3.2* 3.8 3.7 3.1*  CL 88*  --  105  --  104 101 97*  CO2 24  --  21*  --  21* 21* 22  GLUCOSE 170*  --  116*  --  92 100* 106*  BUN 11  --  8  --  9 9 9   CREATININE 1.65*  --  0.88  --  0.75 0.83 0.80  CALCIUM 8.7*  --  7.6*  --  8.0* 8.2* 7.9*  MG  --  2.3  --   --  1.9 1.8  --   PHOS  --   --   --   --   --  2.8  --    GFR: Estimated Creatinine Clearance: 95.7 mL/min (by C-G formula based on SCr of 0.8 mg/dL). Liver Function Tests: Recent Labs  Lab 07/03/18 1506 07/06/18 0257  AST 87* 71*  ALT 94* 71*  ALKPHOS 60 50  BILITOT 1.8* 0.9  PROT 6.6 5.3*  ALBUMIN 3.8 2.7*   No results for input(s): LIPASE, AMYLASE in the last 168 hours. No results for input(s): AMMONIA in the last 168 hours. Coagulation Profile: Recent Labs  Lab 07/03/18 1510  INR 0.86   Cardiac Enzymes: Recent Labs  Lab 07/03/18 2141 07/04/18 0220 07/04/18 1017 07/06/18 0257  TROPONINI 5.86* 6.47* 4.63* 0.97*   BNP (last 3 results) No results for input(s): PROBNP in the last 8760 hours. HbA1C: No results for input(s): HGBA1C in the last 72 hours. CBG: Recent Labs  Lab 07/03/18 1453 07/05/18 0800  GLUCAP 175* 105*   Lipid Profile: Recent Labs    07/05/18 0256  CHOL 109  HDL 50  LDLCALC 46  TRIG 66  CHOLHDL 2.2  Thyroid Function Tests: No results for input(s): TSH, T4TOTAL, FREET4, T3FREE, THYROIDAB in the last 72 hours. Anemia Panel: No results for input(s): VITAMINB12, FOLATE, FERRITIN, TIBC, IRON, RETICCTPCT in the last 72 hours. Urine analysis:    Component Value Date/Time   COLORURINE YELLOW 04/02/2018 [place] 04/02/2018 1513   LABSPEC 1.015 04/02/2018 1513   PHURINE 6.0 04/02/2018 1513   GLUCOSEU NEGATIVE 04/02/2018 1513   GLUCOSEU NEGATIVE 05/24/2016 1036   HGBUR NEGATIVE 04/02/2018 1513   BILIRUBINUR NEGATIVE 04/02/2018 1513   KETONESUR NEGATIVE 04/02/2018 1513   PROTEINUR 30 (A) 04/02/2018 1513   UROBILINOGEN 0.2  05/24/2016 1036   NITRITE NEGATIVE 04/02/2018 1513   LEUKOCYTESUR NEGATIVE 04/02/2018 1513     Oscar Phillips M.D. [facility] 07/07/2018, 12:50 PM  Pager: [phone] Between 7am to 7pm - call Pager - [phone]  After 7pm go to [email or web address] - password [email or web address]  Call night coverage person covering after 7pm

## 2018-07-08 DIAGNOSIS — E871 Hypo-osmolality and hyponatremia: Secondary | ICD-10-CM

## 2018-07-08 LAB — BASIC METABOLIC PANEL
ANION GAP: 8 (ref 5–15)
BUN: 10 mg/dL (ref 8–23)
CO2: 23 mmol/L (ref 22–32)
Calcium: 8.1 mg/dL — ABNORMAL LOW (ref 8.9–10.3)
Chloride: 100 mmol/L (ref 98–111)
Creatinine, Ser: 0.75 mg/dL (ref 0.61–1.24)
GFR calc non Af Amer: >60 mL/min (ref >60)
Glucose, Bld: 105 mg/dL — ABNORMAL HIGH (ref 70–99)
POTASSIUM: 3.6 mmol/L (ref 3.5–5.1)
SODIUM: 131 mmol/L — AB (ref 135–145)

## 2018-07-08 LAB — CULTURE, BLOOD (ROUTINE X 2)
CULTURE: NO GROWTH
Culture: NO GROWTH
Special Requests: ADEQUATE
Special Requests: ADEQUATE

## 2018-07-08 MED ORDER — MECLIZINE HCL 25 MG PO TABS: 25.0000 mg | ORAL_TABLET | Freq: Three times a day (TID) | ORAL | Status: DC | PRN

## 2018-07-08 NOTE — Evaluation (Signed)
Physical Therapy Evaluation Patient Details Name: Oscar Phillips MRN: [id number] DOB: January 21, 1956 Today's Date: 07/08/2018   History of Present Illness  Patient is a 62 year old male with a history of tobacco abuse, COPD, alcoholism presented with 9-day history of generalized fatigue, not been eating for a week with no appetite, diarrhea.  Patient was hypotensive in the ED, presented severely hypovolemic.  BP improved after fluid resuscitation and vasopressors but eventually weaned off after 6 L of fluids.  He was found to have acute kidney injury, elevated troponins.  Cardiology was consulted.  2D echo and cardiac cath confirmed cardiomyopathy but no obstructive CAD.  Patient also developed severe alcohol withdrawals requiring transient Precedex infusion in ICU.  Clinical Impression  Pt admitted with above diagnosis. Pt currently with functional limitations due to the deficits listed below (see PT Problem List). Pt was able to ambulate with min guard assist due to slight imbalance at times.  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 Discussed use of cane for stability and pt agrees.  Also recommend Outpt PT for vestibular rehab.  Will follow acutely.  Pt will benefit from skilled PT to increase their independence and safety with mobility to allow discharge to the venue listed below.     Follow Up Recommendations Outpatient PT(vestibular rehab)    Equipment Recommendations  Cane(pt to pick up on way home - wants to buy out of pocket)    Recommendations for Other Services       Precautions / Restrictions Precautions Precautions: Fall Restrictions Weight Bearing Restrictions: No      Mobility  Bed Mobility Overal bed mobility: Independent                Transfers Overall transfer level: Independent                  Ambulation/Gait Ambulation/Gait assistance: Min guard Gait Distance (Feet): 300 Feet Assistive device: None Gait  Pattern/deviations: Step-through pattern;Decreased stride length;Drifts right/left   Gait velocity interpretation: 1.31 - 2.62 ft/sec, indicative of limited community ambulator General Gait Details: Pt with occasional drifting to right with walking with challenges of which pt self corrected.  Pt has fairly good awareness of his imbalance at times to right.   Stairs            Wheelchair Mobility    Modified Rankin (Stroke Patients Only)       Balance Overall balance assessment: Needs assistance Sitting-balance support: No upper extremity supported;Feet supported Sitting balance-Leahy Scale: Good     Standing balance support: No upper extremity supported;During functional activity Standing balance-Leahy Scale: Fair Standing balance comment: can stand statically without LOB.                              Pertinent Vitals/Pain Pain Assessment: No/denies pain    Home Living Family/patient expects to be discharged to:: Private residence Living Arrangements: Alone Available Help at Discharge: Family;Available PRN/intermittently Type of Home: House         Home Equipment: None      Prior Function Level of Independence: Independent               Hand Dominance        Extremity/Trunk Assessment   Upper Extremity Assessment Upper Extremity Assessment: Defer to OT evaluation    Lower Extremity Assessment Lower Extremity Assessment: Generalized weakness    Cervical / Trunk Assessment Cervical / Trunk Assessment: Normal  Communication  Communication: No difficulties  Cognition Arousal/Alertness: Awake/alert Behavior During Therapy: WFL for tasks assessed/performed Overall Cognitive Status: Within Functional Limits for tasks assessed                                        General Comments General comments (skin integrity, edema, etc.): Vestibular testing negative for BPPV all canals.  Pt did test positive for right  hypofunctionand initiated x 1 exercises.      Exercises Other Exercises Other Exercises: x1 exercises sitting and standing.    Assessment/Plan    PT Assessment Patient needs continued PT services  PT Problem List Decreased activity tolerance;Decreased balance;Decreased mobility;Decreased knowledge of use of DME;Decreased safety awareness;Decreased knowledge of precautions       PT Treatment Interventions DME instruction;Gait training;Stair training;Functional mobility training;Therapeutic activities;Therapeutic exercise;Balance training;Patient/family education    PT Goals (Current goals can be found in the Care Plan section)  Acute Rehab PT Goals Patient Stated Goal: to go home PT Goal Formulation: With patient Time For Goal Achievement: 07/22/18 Potential to Achieve Goals: Good    Frequency Min 3X/week   Barriers to discharge        Co-evaluation               AM-PAC PT "6 Clicks" Daily Activity  Outcome Measure Difficulty turning over in bed (including adjusting bedclothes, sheets and blankets)?: None Difficulty moving from lying on back to sitting on the side of the bed? : None Difficulty sitting down on and standing up from a chair with arms (e.g., wheelchair, bedside commode, etc,.)?: None Help needed moving to and from a bed to chair (including a wheelchair)?: None Help needed walking in hospital room?: A Little Help needed climbing 3-5 steps with a railing? : A Little 6 Click Score: 22    End of Session Equipment Utilized During Treatment: Gait belt Activity Tolerance: Patient tolerated treatment well Patient left: in bed;with call bell/phone within reach(sitting EOB) Nurse Communication: Mobility status PT Visit Diagnosis: Unsteadiness on feet (R26.81);Muscle weakness (generalized) (M62.81)    Time: 0263-7858 PT Time Calculation (min) (ACUTE ONLY): 26 min   Charges:   PT Evaluation $PT Eval Moderate Complexity: 1 Mod PT Treatments $Gait Training:  8-22 mins        [place] Pager:  [phone]  Office:  [place] 07/08/2018, 1:48 PM

## 2018-07-08 NOTE — Progress Notes (Signed)
CARDIAC REHAB PHASE I   PRE:  Rate/Rhythm: 87 SR  BP:  Supine:   Sitting: 118/64  Standing:    SaO2: 97%RA  MODE:  Ambulation: 430 ft   POST:  Rate/Rhythm: 86 SR  BP:  Supine:   Sitting: 103/58  Standing:    SaO2: 95%RA 1318-1430 Pt walked 430 ft on RA with asst x 1 and one asst to follow with rollator in case pt got dizzy. He kept saying it was his equilibrium. Stopped once to rest but did not need to sit. Education completed with pt and sister. Gave CHF booklet and reviewed zones. Stressed importance of daily weights, 2000 mg sodium restriction and 2LFR with low EF. Gave MI booklet and reviewed restrictions. Gave walking instruction. Gave fake cigarette and smoking cessation handout. Pt stated he felt that he could possibly quit alcohol but not the cigarettes. Emotional support given and encouragement.  Sister with questions and seemed very supportive.   Graylon Good, RN BSN  07/08/2018 2:58 PM

## 2018-07-08 NOTE — Progress Notes (Signed)
[facility]                                                                              Patient Demographics  Oscar Phillips, is a 62 y.o. male, DOB - 1955-12-23, EYC:[id number]  Admit date - 07/03/2018   Admitting Physician Laurin Coder, MD  Outpatient Primary MD for the patient is Janith Lima, MD  Outpatient specialists:   LOS - 5  days   Medical records reviewed and are as summarized below:    Chief Complaint  Patient presents with  . Loss of Consciousness  . Hypotension       Brief summary   Patient is a 62 year old male with a history of tobacco abuse, COPD, alcoholism presented with 9-day history of generalized fatigue, not been eating for a week with no appetite, diarrhea.  Patient was hypotensive in the ED, presented severely hypovolemic.  BP improved after fluid resuscitation and vasopressors but eventually weaned off after 6 L of fluids.  He was found to have acute kidney injury, elevated troponins.  Cardiology was consulted.  2D echo and cardiac cath confirmed cardiomyopathy but no obstructive CAD.  Patient also developed severe alcohol withdrawals requiring transient Precedex infusion in ICU. Patient is now transferred to hospitalist service, assumed care on 9/29.   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 -Cardiac cath showed severe left ventricular systolic dysfunction, EF less than 25%, no obstructive CAD consistent with Takotsubo's cardiomyopathy  Active Problems: Hypovolemic shock, hypotension -BP still currently soft, not on any antihypertensives  Acute systolic and diastolic CHF -2D echo with EF of 35 to 40% with  grade 1 diastolic dysfunction.  Cardiac cath with no obstructive CAD. -Currently appears compensated, no evidence of volume overload -Cardiology following  Vertigo -PT evaluation done, suspected right vestibular hypofunction, recommended outpatient PT for vestibular rehab -Meclizine as needed ordered  Alcohol withdrawals -Patient required Precedex drip in ICU, currently stable, no agitation, continue CIWA scale with Ativan -B12, folate within normal limits, BUN pending  COPD with exacerbation -Wheezing improving, continue scheduled Xopenex, Atrovent nebs, prednisone, Breo  -Continue doxycycline.    Nicotine abuse Recommended strongly to quit smoking, continue nicotine patch     Pressure injury of skin Stage I left heel, wound care per nursing  Hyponatremia Improving, continue current management  Code Status: DNR DVT Prophylaxis: Heparin subcu Family Communication: Discussed in detail with the patient, all imaging results, lab results explained to the patient and sister    Disposition Plan:   Time Spent in minutes 27mins   Procedures:   Cardiac catheterization 07/04/2018:   Widely patent left main.  Segmental 50% LAD narrowing. Large first diagonal with 70% stenosis in the mid to distal vessel.  Large normal ramus intermedius  Circumflex coronary gives origin to 3 tiny obtuse marginal branches. 50-60 % stenosis noted in the mid body of the circumflex.  Dominant right coronary with luminal irregularities.  Apical ballooning identified on left ventriculography consistent with Takotsubo cardiomyopathy  Severe left ventricular systolic dysfunction with EF less than 25% and LVEDP 24 mmHg   Consultants:   Cardiology CCM  Antimicrobials:   Doxycycline 9/29   Medications  Scheduled Meds: . atorvastatin  80 mg Oral q1800  . chlorhexidine  15 mL Mouth Rinse BID  . doxycycline  100 mg Oral Q12H  . fluticasone furoate-vilanterol  1 puff Inhalation Daily  .  folic acid  1 mg Oral Daily  . heparin  5,000 Units Subcutaneous Q8H  . levalbuterol  0.63 mg Nebulization Q6H   And  . ipratropium  0.5 mg Nebulization Q6H  . lisinopril  10 mg Oral Daily  . mouth rinse  15 mL Mouth Rinse q12n4p  . metoprolol succinate  50 mg Oral Daily  . multivitamin with minerals  1 tablet Oral Daily  . nicotine  14 mg Transdermal Daily  . predniSONE  40 mg Oral Q breakfast  . sodium chloride flush  3 mL Intravenous Q12H  . thiamine  100 mg Oral Daily   Continuous Infusions: . sodium chloride 10 mL/hr at 07/05/18 0400  . sodium chloride     PRN Meds:.sodium chloride, acetaminophen, Influenza vac split quadrivalent PF, levalbuterol, LORazepam **OR** LORazepam, meclizine, ondansetron (ZOFRAN) IV, sodium chloride flush   Antibiotics   Anti-infectives (From admission, onward)   Start     Dose/Rate Route Frequency Ordered Stop   07/07/18 1030  doxycycline (VIBRA-TABS) tablet 100 mg     100 mg Oral Every 12 hours 07/07/18 0940     07/03/18 1630  vancomycin (VANCOCIN) 1,250 mg in sodium chloride 0.9 % 250 mL IVPB     1,250 mg 166.7 mL/hr over 90 Minutes Intravenous  Once 07/03/18 1536 07/03/18 1816   07/03/18 1515  ceFEPIme (MAXIPIME) 2 g in sodium chloride 0.9 % 100 mL IVPB     2 g 200 mL/hr over 30 Minutes Intravenous  Once 07/03/18 1507 07/03/18 1600        Subjective:   Oscar Phillips was seen and examined today. Feeling better today, wheezing improving. No chest pain, sob. Still feeling dizzy and vertigo symptoms, states that he has had issues with vertigo in the past due to his "ear canals" .  Objective:   Vitals:   07/08/18 0840 07/08/18 1142 07/08/18 1350 07/08/18 1422  BP:   (!) 103/58 (!) 92/59  Pulse: 94 79 76 75  Resp: 20 18 (!) 22   Temp:    97.7 F (36.5 C)  TempSrc:    Oral  SpO2: 96% 96% 96% 97%  Weight:      Height:        Intake/Output Summary (Last 24 hours) at 07/08/2018 1528 Last data filed at 07/08/2018 0600 Gross per 24  hour  Intake 240 ml  Output 600 ml  Net -360 ml     Wt Readings from Last 3 Encounters:  07/08/18 68.9 kg  04/17/18 67.1 kg  04/01/18 65.8 kg     Exam   General: Alert and oriented x 3, NAD  Eyes:   HEENT:  Atraumatic, normocephalic  Cardiovascular: S1 S2 auscultated, Regular rate and rhythm. No pedal edema b/l  Respiratory: Mild scattered wheezing bilaterally, improving   Gastrointestinal: Soft, nontender, nondistended, + bowel sounds  Ext: no pedal edema bilaterally  Neuro: no new deficit  Musculoskeletal: No digital cyanosis, clubbing  Skin: No rashes  Psych: Normal affect and demeanor,  alert and oriented x3     Data Reviewed:  I have personally reviewed following labs and imaging studies  Micro Results Recent Results (from the past 240 hour(s))  Blood culture (routine x 2)     Status: None   Collection Time: 07/03/18  3:07 PM  Result Value Ref Range Status   Specimen Description   Final    BLOOD LEFT ANTECUBITAL Performed at [facility] [address] [place], [place] [address]    Special Requests   Final    BOTTLES DRAWN AEROBIC AND ANAEROBIC Blood Culture adequate volume Performed at [facility] [address], [place], [place] [address]    Culture   Final    NO GROWTH 5 DAYS Performed at [facility], [place] [address] [place], [place] [address]    Report Status 07/08/2018 FINAL  Final  Blood culture (routine x 2)     Status: None   Collection Time: 07/03/18  3:13 PM  Result Value Ref Range Status   Specimen Description   Final    BLOOD LEFT FOREARM Performed at [facility] [address] [place], [place] [address]    Special Requests   Final    BOTTLES DRAWN AEROBIC AND ANAEROBIC Blood Culture adequate volume Performed at [facility] [address], [place], [place] [address]    Culture   Final    NO GROWTH 5 DAYS Performed at [facility], [place] [address] [place], [place] [address]    Report Status 07/08/2018 FINAL  Final  MRSA PCR Screening     Status: None   Collection Time: 07/04/18  1:06 AM  Result Value Ref Range Status   MRSA by PCR NEGATIVE NEGATIVE Final    Comment:        The GeneXpert MRSA Assay (FDA approved for NASAL specimens only), is one component of a comprehensive MRSA colonization surveillance program. It is not intended to diagnose MRSA infection nor to guide or monitor treatment for MRSA infections. Performed at [facility], [place] [address], [place], [place] [address]     Radiology Reports US Abdomen Complete  Result Date: 07/04/2018 CLINICAL DATA:  Abdominal pain EXAM: ABDOMEN ULTRASOUND COMPLETE COMPARISON:  03/02/2018 FINDINGS: Gallbladder: Normally distended without stones or wall thickening. No pericholecystic fluid or sonographic Murphy sign. Common bile duct: Diameter: Normal caliber 3 mm diameter Liver: Echogenic parenchyma, likely fatty infiltration though this can be seen with cirrhosis and certain infiltrative disorders. No focal hepatic mass or nodularity. Portal vein is patent on color Doppler imaging with normal direction of blood flow towards the liver. IVC: Normal appearance Pancreas: Portions of the pancreatic head through mid tail normal appearance, remainder obscured by bowel gas. Spleen: Normal appearance, 5.5 cm length Right Kidney: Length: 12.6 cm. Normal morphology without mass or hydronephrosis. Left Kidney: Length: 11.1 cm. Normal morphology without mass or hydronephrosis. Abdominal aorta: Proximal and mid portions are normal in caliber. Distally the aorta is obscured by bowel gas. Other findings: No free fluid IMPRESSION: Incomplete pancreatic and aortic visualization. Probable fatty infiltration of liver as above. No acute abnormalities otherwise identified. Electronically Signed   By: Lavonia Dana M.D.   On: 07/04/2018 16:28   Dg Chest Port 1 View  Result Date:  07/06/2018 CLINICAL DATA:  Respiratory failure EXAM: PORTABLE CHEST 1 VIEW COMPARISON:  July 03, 2018 FINDINGS: There is airspace consolidation throughout much of the left upper lobe. There is more  patchy opacity in the right mid lung region. There is interstitial prominence with probable mild pulmonary edema in the bases. Heart is borderline enlarged with pulmonary vascularity normal. No adenopathy. There is aortic atherosclerosis. No bone lesions. IMPRESSION: Airspace consolidation throughout much of the left upper lobe and to a much lesser degree in the right mid lung, likely foci of pneumonia. Suspect mild basilar interstitial edema. Stable cardiac prominence. There is aortic atherosclerosis. Aortic Atherosclerosis (ICD10-I70.0). Electronically Signed   By: Lowella Grip III M.D.   On: 07/06/2018 08:39   Dg Chest Portable 1 View  Result Date: 07/03/2018 CLINICAL DATA:  Syncopal episode, hypertension EXAM: PORTABLE CHEST 1 VIEW COMPARISON:  04/17/2018 FINDINGS: Defibrillator pads overlie the chest. Normal heart size and vascularity. Mild hyperinflation, suspect COPD/emphysema. Negative for edema, effusion, collapse, consolidation, or pneumothorax. Trachea is midline. Aorta is atherosclerotic. Bones are osteopenic IMPRESSION: No active disease. Electronically Signed   By: Jerilynn Mages.  Shick M.D.   On: 07/03/2018 16:50    Lab Data:  CBC: Recent Labs  Lab 07/03/18 1502 07/03/18 1506 07/04/18 0327 07/06/18 0257  WBC  --  12.6* 11.9* 8.7  NEUTROABS  --  9.4*  --   --   HGB 15.6 15.3 14.0 12.4*  HCT 46.0 43.7 41.2 36.9*  MCV  --  92.0 94.3 93.9  PLT  --  216 160 324   Basic Metabolic Panel: Recent Labs  Lab 07/03/18 1510 07/04/18 0705 07/04/18 1017 07/05/18 0256 07/06/18 0257 07/07/18 0338 07/08/18 0341  NA  --  133*  --  132* 130* 128* 131*  K  --  2.9* 3.2* 3.8 3.7 3.1* 3.6  CL  --  105  --  104 101 97* 100  CO2  --  21*  --  21* 21* 22 23  GLUCOSE  --  116*  --  92 100* 106* 105*   BUN  --  8  --  9 9 9 10   CREATININE  --  0.88  --  0.75 0.83 0.80 0.75  CALCIUM  --  7.6*  --  8.0* 8.2* 7.9* 8.1*  MG 2.3  --   --  1.9 1.8  --   --   PHOS  --   --   --   --  2.8  --   --    GFR: Estimated Creatinine Clearance: 93.3 mL/min (by C-G formula based on SCr of 0.75 mg/dL). Liver Function Tests: Recent Labs  Lab 07/03/18 1506 07/06/18 0257  AST 87* 71*  ALT 94* 71*  ALKPHOS 60 50  BILITOT 1.8* 0.9  PROT 6.6 5.3*  ALBUMIN 3.8 2.7*   No results for input(s): LIPASE, AMYLASE in the last 168 hours. No results for input(s): AMMONIA in the last 168 hours. Coagulation Profile: Recent Labs  Lab 07/03/18 1510  INR 0.86   Cardiac Enzymes: Recent Labs  Lab 07/03/18 2141 07/04/18 0220 07/04/18 1017 07/06/18 0257  TROPONINI 5.86* 6.47* 4.63* 0.97*   BNP (last 3 results) No results for input(s): PROBNP in the last 8760 hours. HbA1C: No results for input(s): HGBA1C in the last 72 hours. CBG: Recent Labs  Lab 07/03/18 1453 07/05/18 0800  GLUCAP 175* 105*   Lipid Profile: No results for input(s): CHOL, HDL, LDLCALC, TRIG, CHOLHDL, LDLDIRECT in the last 72 hours. Thyroid Function Tests: No results for input(s): TSH, T4TOTAL, FREET4, T3FREE, THYROIDAB in the last 72 hours. Anemia Panel: Recent Labs    07/07/18 1420  VITAMINB12 309  FOLATE 47.7   Urine  analysis:    Component Value Date/Time   COLORURINE YELLOW 07/07/2018 1600   APPEARANCEUR CLEAR 07/07/2018 1600   LABSPEC 1.006 07/07/2018 1600   PHURINE 7.0 07/07/2018 1600   GLUCOSEU NEGATIVE 07/07/2018 1600   GLUCOSEU NEGATIVE 05/24/2016 1036   HGBUR SMALL (A) 07/07/2018 1600   BILIRUBINUR NEGATIVE 07/07/2018 1600   KETONESUR NEGATIVE 07/07/2018 1600   PROTEINUR NEGATIVE 07/07/2018 1600   UROBILINOGEN 0.2 05/24/2016 1036   NITRITE NEGATIVE 07/07/2018 1600   LEUKOCYTESUR NEGATIVE 07/07/2018 1600     Lachlyn Vanderstelt M.D. [facility] 07/08/2018, 3:28 PM  Pager: [phone] Between 7am to 7pm  - call Pager - [phone]  After 7pm go to [email or web address] - password [email or web address]  Call night coverage person covering after 7pm

## 2018-07-08 NOTE — Progress Notes (Addendum)
CSW was consulted to provide substance abuse treatment information to pt.  CSW spoke with pt and pt's sister at bedside and gave treatment options to pt.    CSW signing off.  Reed Breech LCSWA [phone]

## 2018-07-09 ENCOUNTER — Other Ambulatory Visit: Payer: Self-pay | Admitting: Internal Medicine

## 2018-07-09 DIAGNOSIS — K210 Gastro-esophageal reflux disease with esophagitis: Secondary | ICD-10-CM

## 2018-07-09 DIAGNOSIS — I5181 Takotsubo syndrome: Principal | ICD-10-CM

## 2018-07-09 LAB — BASIC METABOLIC PANEL
Anion gap: 6 (ref 5–15)
BUN: 14 mg/dL (ref 8–23)
CO2: 23 mmol/L (ref 22–32)
CREATININE: 0.86 mg/dL (ref 0.61–1.24)
Calcium: 8.1 mg/dL — ABNORMAL LOW (ref 8.9–10.3)
Chloride: 101 mmol/L (ref 98–111)
GFR calc Af Amer: >60 mL/min (ref >60)
Glucose, Bld: 101 mg/dL — ABNORMAL HIGH (ref 70–99)
Potassium: 3.5 mmol/L (ref 3.5–5.1)
SODIUM: 130 mmol/L — AB (ref 135–145)

## 2018-07-09 MED ORDER — FOLIC ACID 1 MG PO TABS: 1.0000 mg | ORAL_TABLET | Freq: Every day | ORAL | 1 refills | Status: DC

## 2018-07-09 MED ORDER — IPRATROPIUM BROMIDE 0.02 % IN SOLN
0.5000 mg | Freq: Three times a day (TID) | RESPIRATORY_TRACT | Status: DC
  Administered 2018-07-09: 0.5 mg via RESPIRATORY_TRACT
  Filled 2018-07-09: qty 2.5

## 2018-07-09 MED ORDER — DEXLANSOPRAZOLE 60 MG PO CPDR: 60.0000 mg | DELAYED_RELEASE_CAPSULE | Freq: Every day | ORAL | 1 refills | Status: DC

## 2018-07-09 MED ORDER — PREDNISONE 20 MG PO TABS: 40.0000 mg | ORAL_TABLET | Freq: Every day | ORAL | 0 refills | Status: AC

## 2018-07-09 MED ORDER — DOXYCYCLINE HYCLATE 100 MG PO TABS: 100.0000 mg | ORAL_TABLET | Freq: Two times a day (BID) | ORAL | 0 refills | Status: AC

## 2018-07-09 MED ORDER — LISINOPRIL 10 MG PO TABS: 10.0000 mg | ORAL_TABLET | Freq: Every day | ORAL | 3 refills | Status: DC

## 2018-07-09 MED ORDER — THIAMINE HCL 100 MG PO TABS: 100.0000 mg | ORAL_TABLET | Freq: Every day | ORAL | 0 refills | Status: DC

## 2018-07-09 MED ORDER — NICOTINE 14 MG/24HR TD PT24: 14.0000 mg | MEDICATED_PATCH | Freq: Every day | TRANSDERMAL | 0 refills | Status: DC

## 2018-07-09 MED ORDER — METOPROLOL SUCCINATE ER 50 MG PO TB24: 50.0000 mg | ORAL_TABLET | Freq: Every day | ORAL | 3 refills | Status: DC

## 2018-07-09 MED ORDER — TIOTROPIUM BROMIDE MONOHYDRATE 18 MCG IN CAPS
18.0000 ug | ORAL_CAPSULE | Freq: Every day | RESPIRATORY_TRACT | Status: DC
  Filled 2018-07-09: qty 5

## 2018-07-09 MED ORDER — TIOTROPIUM BROMIDE MONOHYDRATE 2.5 MCG/ACT IN AERS: 2.0000 | INHALATION_SPRAY | Freq: Every day | RESPIRATORY_TRACT | 1 refills | Status: DC

## 2018-07-09 MED ORDER — FLUTICASONE FUROATE-VILANTEROL 100-25 MCG/INH IN AEPB: 1.0000 | INHALATION_SPRAY | Freq: Every day | RESPIRATORY_TRACT | 1 refills | Status: DC

## 2018-07-09 MED ORDER — MIRTAZAPINE 7.5 MG PO TABS: 7.5000 mg | ORAL_TABLET | Freq: Every day | ORAL | Status: DC

## 2018-07-09 MED ORDER — MECLIZINE HCL 25 MG PO TABS: 25.0000 mg | ORAL_TABLET | Freq: Three times a day (TID) | ORAL | 0 refills | Status: DC | PRN

## 2018-07-09 MED ORDER — ATORVASTATIN CALCIUM 40 MG PO TABS: 40.0000 mg | ORAL_TABLET | Freq: Every day | ORAL | 3 refills | Status: DC

## 2018-07-09 MED ORDER — MIRTAZAPINE 7.5 MG PO TABS: 7.5000 mg | ORAL_TABLET | Freq: Every day | ORAL | 0 refills | Status: DC

## 2018-07-09 MED ORDER — LEVALBUTEROL HCL 0.63 MG/3ML IN NEBU
0.6300 mg | INHALATION_SOLUTION | Freq: Three times a day (TID) | RESPIRATORY_TRACT | Status: DC
  Administered 2018-07-09: 0.63 mg via RESPIRATORY_TRACT
  Filled 2018-07-09 (×2): qty 3

## 2018-07-09 MED ORDER — UNABLE TO FIND: 0 refills | Status: DC

## 2018-07-09 NOTE — Progress Notes (Signed)
Physical Therapy Treatment Patient Details Name: Oscar Phillips MRN: [id number] DOB: 1956/02/26 Today's Date: 07/09/2018    History of Present Illness Patient is a 62 year old male with a history of tobacco abuse, COPD, alcoholism presented with 9-day history of generalized fatigue, not been eating for a week with no appetite, diarrhea.  Patient was hypotensive in the ED, presented severely hypovolemic.  BP improved after fluid resuscitation and vasopressors but eventually weaned off after 6 L of fluids.  He was found to have acute kidney injury, elevated troponins.  Cardiology was consulted.  2D echo and cardiac cath confirmed cardiomyopathy but no obstructive CAD.  Patient also developed severe alcohol withdrawals requiring transient Precedex infusion in ICU.    PT Comments    Pt admitted with above diagnosis. Pt currently with functional limitations due to balance and endurance deficits. Pt was able to ambulate with challenges to balance with min guard assist to supervision.  Pt still performing exercises.  Will continue acute PT.   Pt will benefit from skilled PT to increase their independence and safety with mobility to allow discharge to the venue listed below.     Follow Up Recommendations  Outpatient PT(vestibular and balance rehab)     Equipment Recommendations  None recommended by PT    Recommendations for Other Services       Precautions / Restrictions Precautions Precautions: Fall Restrictions Weight Bearing Restrictions: No    Mobility  Bed Mobility Overal bed mobility: Independent                Transfers Overall transfer level: Independent                  Ambulation/Gait Ambulation/Gait assistance: Min guard;Supervision Gait Distance (Feet): 350 Feet Assistive device: None Gait Pattern/deviations: Step-through pattern;Decreased stride length;Drifts right/left   Gait velocity interpretation: 1.31 - 2.62 ft/sec, indicative of limited community  ambulator General Gait Details: Pt with occasional drifting to right with walking with challenges of which pt self corrected.  Pt has fairly good awareness of his imbalance at times to right.    Stairs             Wheelchair Mobility    Modified Rankin (Stroke Patients Only)       Balance Overall balance assessment: Needs assistance Sitting-balance support: No upper extremity supported;Feet supported Sitting balance-Leahy Scale: Good     Standing balance support: No upper extremity supported;During functional activity Standing balance-Leahy Scale: Fair Standing balance comment: can stand statically without LOB.                  Standardized Balance Assessment Standardized Balance Assessment : Dynamic Gait Index   Dynamic Gait Index Level Surface: Normal Change in Gait Speed: Normal Gait with Horizontal Head Turns: Mild Impairment Gait with Vertical Head Turns: Normal Gait and Pivot Turn: Normal Step Over Obstacle: Normal Step Around Obstacles: Normal Steps: Mild Impairment Total Score: 22      Cognition Arousal/Alertness: Awake/alert Behavior During Therapy: WFL for tasks assessed/performed Overall Cognitive Status: Within Functional Limits for tasks assessed                                        Exercises Other Exercises Other Exercises: x1 exercises sitting and standing.     General Comments        Pertinent Vitals/Pain Pain Assessment: No/denies pain    Home Living  Prior Function            PT Goals (current goals can now be found in the care plan section) Acute Rehab PT Goals Patient Stated Goal: to go home Progress towards PT goals: Progressing toward goals    Frequency    Min 3X/week      PT Plan Current plan remains appropriate    Co-evaluation              AM-PAC PT "6 Clicks" Daily Activity  Outcome Measure  Difficulty turning over in bed (including adjusting  bedclothes, sheets and blankets)?: None Difficulty moving from lying on back to sitting on the side of the bed? : None Difficulty sitting down on and standing up from a chair with arms (e.g., wheelchair, bedside commode, etc,.)?: None Help needed moving to and from a bed to chair (including a wheelchair)?: None Help needed walking in hospital room?: A Little Help needed climbing 3-5 steps with a railing? : A Little 6 Click Score: 22    End of Session Equipment Utilized During Treatment: Gait belt Activity Tolerance: Patient tolerated treatment well Patient left: in bed;with call bell/phone within reach Nurse Communication: Mobility status PT Visit Diagnosis: Unsteadiness on feet (R26.81);Muscle weakness (generalized) (M62.81)     Time: 9574-7340 PT Time Calculation (min) (ACUTE ONLY): 17 min  Charges:  $Gait Training: 8-22 mins                     [place] Pager:  [phone]  Office:  [place] 07/09/2018, 9:39 AM

## 2018-07-09 NOTE — Progress Notes (Addendum)
Progress Note  Patient Name: Oscar Phillips Date of Encounter: 07/09/2018  Primary Cardiologist: Minus Breeding, MD   Subjective   No chest pain or SOB, plans to be discharged today  Inpatient Medications    Scheduled Meds: . atorvastatin  80 mg Oral q1800  . chlorhexidine  15 mL Mouth Rinse BID  . doxycycline  100 mg Oral Q12H  . fluticasone furoate-vilanterol  1 puff Inhalation Daily  . folic acid  1 mg Oral Daily  . heparin  5,000 Units Subcutaneous Q8H  . levalbuterol  0.63 mg Nebulization TID   And  . ipratropium  0.5 mg Nebulization TID  . lisinopril  10 mg Oral Daily  . mouth rinse  15 mL Mouth Rinse q12n4p  . metoprolol succinate  50 mg Oral Daily  . multivitamin with minerals  1 tablet Oral Daily  . nicotine  14 mg Transdermal Daily  . predniSONE  40 mg Oral Q breakfast  . sodium chloride flush  3 mL Intravenous Q12H  . thiamine  100 mg Oral Daily   Continuous Infusions: . sodium chloride 10 mL/hr at 07/05/18 0400  . sodium chloride     PRN Meds: sodium chloride, acetaminophen, Influenza vac split quadrivalent PF, levalbuterol, LORazepam **OR** LORazepam, meclizine, ondansetron (ZOFRAN) IV, sodium chloride flush   Vital Signs    Vitals:   07/08/18 2019 07/08/18 2026 07/09/18 0500 07/09/18 0806  BP: 118/75  128/77   Pulse: 74  80 78  Resp: 16  16 20   Temp: (!) 97.4 F (36.3 C)  98.3 F (36.8 C)   TempSrc: Oral  Oral   SpO2: 97% 98% 97% 97%  Weight:   74.1 kg   Height:        Intake/Output Summary (Last 24 hours) at 07/09/2018 0919 Last data filed at 07/09/2018 0700 Gross per 24 hour  Intake 243 ml  Output 300 ml  Net -57 ml   Filed Weights   07/07/18 0449 07/08/18 0343 07/09/18 0500  Weight: 69.9 kg 68.9 kg 74.1 kg    Telemetry    SR with occ PACs - Personally Reviewed  ECG    No new - Personally Reviewed  Physical Exam   GEN: No acute distress.   Neck: No JVD Cardiac: RRR, no murmurs, rubs, or gallops.  Respiratory: Clear to  auscultation bilaterally. GI: Soft, nontender, non-distended  MS: No edema; No deformity. Neuro:  Nonfocal  Psych: Normal affect   Labs    Chemistry Recent Labs  Lab 07/03/18 1506  07/06/18 0257 07/07/18 0338 07/08/18 0341 07/09/18 0345  NA 126*   < > 130* 128* 131* 130*  K 2.9*   < > 3.7 3.1* 3.6 3.5  CL 88*   < > 101 97* 100 101  CO2 24   < > 21* 22 23 23   GLUCOSE 170*   < > 100* 106* 105* 101*  BUN 11   < > 9 9 10 14   CREATININE 1.65*   < > 0.83 0.80 0.75 0.86  CALCIUM 8.7*   < > 8.2* 7.9* 8.1* 8.1*  PROT 6.6  --  5.3*  --   --   --   ALBUMIN 3.8  --  2.7*  --   --   --   AST 87*  --  71*  --   --   --   ALT 94*  --  71*  --   --   --   ALKPHOS 60  --  50  --   --   --  BILITOT 1.8*  --  0.9  --   --   --   GFRNONAA 43*   < > >60 >60 >60 >60  GFRAA 50*   < > >60 >60 >60 >60  ANIONGAP 14   < > 8 9 8 6    < > = values in this interval not displayed.     Hematology Recent Labs  Lab 07/03/18 1506 07/04/18 0327 07/06/18 0257  WBC 12.6* 11.9* 8.7  RBC 4.75 4.37 3.93*  HGB 15.3 14.0 12.4*  HCT 43.7 41.2 36.9*  MCV 92.0 94.3 93.9  MCH 32.2 32.0 31.6  MCHC 35.0 34.0 33.6  RDW 13.0 12.8 12.8  PLT 216 160 156    Cardiac Enzymes Recent Labs  Lab 07/03/18 2141 07/04/18 0220 07/04/18 1017 07/06/18 0257  TROPONINI 5.86* 6.47* 4.63* 0.97*    Recent Labs  Lab 07/03/18 1501  TROPIPOC 5.36*     BNP Recent Labs  Lab 07/06/18 0257  BNP 1,708.4*     DDimer No results for input(s): DDIMER in the last 168 hours.   Radiology    No results found.  Cardiac Studies   Cardiac catheterization 07/04/2018:   Widely patent left main.  Segmental 50% LAD narrowing. Large first diagonal with 70% stenosis in the mid to distal vessel.  Large normal ramus intermedius  Circumflex coronary gives origin to 3 tiny obtuse marginal branches. 50-60 % stenosis noted in the mid body of the circumflex.  Dominant right coronary with luminal irregularities.  Apical  ballooning identified on left ventriculography consistent with Takotsubo cardiomyopathy  Severe left ventricular systolic dysfunction with EF less than 25% and LVEDP 24 mmHg  Cardiogenic shock on presentation now improving and patient is off pressor therapy.  RECOMMENDATIONS:   Convert heparin to DVT prophylaxis.  Renin-angiotensin and sympathetic nervous system blockade as tolerated by blood pressure to facilitate LV recovery.  Diuresis as needed to prevent pulmonary congestion.  Alcohol cessation   TTE 07/03/18 Study Conclusions  - Left ventricle: The cavity size was normal. Wall thickness was   increased in a pattern of mild LVH. Systolic function was   moderately reduced. The estimated ejection fraction was in the   range of 35% to 40%. There is akinesis of the   mid-apicalanteroseptal, anterior, anterolateral, lateral,   inferolateral, and apical myocardium. Doppler parameters are   consistent with abnormal left ventricular relaxation (grade 1   diastolic dysfunction).  Patient Profile     62 y.o. male with moderate nonobstructive CAD and suspected takotsubo syndrome (versus small NSTEMI, troponin 6.5) in the setting of hypotension/hypovolemia, alcohol withdrawal, and smoking.  Assessment & Plan    1. 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  with syncope and hypotension.   Now out of ICU.  EF 25% on ACE, toprol XL   2. Acute systolic heart failure  now + 3870 since admit and wt up from 68.9 kg yesterda to 74 kg today.  3. Alcohol withdrawal -seems well compensated per IM 4. Hyponatremia with Na 130 but up form 128 Ca+ is 8.1 per IM borderline K+  5. Elevated troponin at 6.47 with non obstruction in coronary arteries. Secondary to demand ischemia with acute medical issues.  6. Cardiac rehab has seen     Follow up in office in 7-12 days     For questions or updates, please contact [place] Please consult [email or web address] for contact info under       Signed, Cecilie Kicks, NP  07/09/2018, 9:19 AM  The patient was seen, examined and discussed with Cecilie Kicks, NP and I agree with the above.   The patient is feeling well, euvolemic today, telemetry shows SR. He is on good medical regimen, - lisinopril and toprol XL, he has an appointment arranged for 07/19/2018 with Dr Percival Spanish, if BP allows we would add spironolactone at the time. He is being discharged today.  Ena Dawley, MD 07/09/2018

## 2018-07-09 NOTE — Progress Notes (Signed)
CARDIAC REHAB PHASE I   Talked with pt in depth about importance of alcohol cessation. Pt states this is a "wake up call", and appears to be committed to sobriety. Pt does not think he will be able to quit smoking at this time. Pt encouraged to take it a day at a time, and find out what he is interested in. Pt states over the years he has lost interest in many things he used to enjoy. Pt referred to [facility] [place].   [id number] [name], RN BSN 07/09/2018 11:05 AM

## 2018-07-09 NOTE — Care Management Note (Signed)
Case Management Note  Patient Details  Name: Oscar Phillips MRN: [id number] Date of Birth: Feb 11, 1956  Subjective/Objective: Pt presented for NStemi -hx of ETOH abuse. Pt has family support of sister and brother both living outside of [place].                     Action/Plan: PT recommendations for Outpatient PT services. CM did speak with patient in regards to Outpatient Therapy- Neuro Rehab to call the patient in regards to office visit time. No further needs from CM at this time.   Expected Discharge Date:  07/09/18               Expected Discharge Plan:  Home/Self Care  In-House Referral:  NA  Discharge planning Services  CM Consult  Post Acute Care Choice:  NA Choice offered to:  NA  DME Arranged:  N/A DME Agency:  NA  HH Arranged:  NA HH Agency:  NA  Status of Service:  Completed, signed off  If discussed at [place] of Stay Meetings, dates discussed:    Additional Comments:  Bethena Roys, RN 07/09/2018, 10:57 AM

## 2018-07-09 NOTE — Discharge Summary (Signed)
Physician Discharge Summary   Patient ID: Oscar Phillips MRN: [id number] DOB/AGE: 1956/06/12 62 y.o.  Admit date: 07/03/2018 Discharge date: 07/09/2018  Primary Care Physician:  Janith Lima, MD   Recommendations for Outpatient Follow-up:  1. Follow up with PCP in 1-2 weeks 2. Patient recommended to follow outpatient psychiatry, he needs referral from his PCP.  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 Hypovolemic shock (HCC)  Hypotension  Acute systolic and diastolic CHF  Vertigo  Depression . Hyperlipidemia with target LDL less than 100 . 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 PAD (peripheral artery disease) ([place]) . Essential hypertension Stage I pressure injury of the skin, left heel  Consults: Cardiology    Allergies:   Allergies  Allergen Reactions  . Penicillins Rash    Has patient had a PCN reaction causing immediate rash, facial/tongue/throat swelling, SOB or lightheadedness with hypotension: Yes Has patient had a PCN reaction causing severe rash involving mucus membranes or skin necrosis: No Has patient had a PCN reaction that required hospitalization: Unknown Has patient had a PCN reaction occurring within the last 10 years: Unknown If all of the above answers are "NO", then may proceed with Cephalosporin use.      DISCHARGE MEDICATIONS: Allergies as of 07/09/2018      Reactions   Penicillins Rash   Has patient had a PCN reaction causing immediate rash, facial/tongue/throat swelling, SOB or lightheadedness with hypotension: Yes Has patient had a PCN reaction causing severe rash involving mucus membranes or skin necrosis: No Has patient had a PCN reaction that required hospitalization: Unknown Has patient had a PCN reaction  occurring within the last 10 years: Unknown If all of the above answers are "NO", then may proceed with Cephalosporin use.      Medication List    STOP taking these medications   amLODipine 10 MG tablet Commonly known as:  NORVASC   ARIPiprazole 5 MG tablet Commonly known as:  ABILIFY   irbesartan 150 MG tablet Commonly known as:  AVAPRO     TAKE these medications   albuterol 108 (90 Base) MCG/ACT inhaler Commonly known as:  PROVENTIL HFA;VENTOLIN HFA Inhale 2 puffs into the lungs every 6 (six) hours as needed for wheezing or shortness of breath.   atorvastatin 40 MG tablet Commonly known as:  LIPITOR Take 1 tablet (40 mg total) by mouth at bedtime.   dexlansoprazole 60 MG capsule Commonly known as:  DEXILANT Take 1 capsule (60 mg total) by mouth daily.   doxycycline 100 MG tablet Commonly known as:  VIBRA-TABS Take 1 tablet (100 mg total) by mouth 2 (two) times daily for 7 days.   fluticasone furoate-vilanterol 100-25 MCG/INH Aepb Commonly known as:  BREO ELLIPTA Inhale 1 puff into the lungs daily.   folic acid 1 MG tablet Commonly known as:  FOLVITE Take 1 tablet (1 mg total) by mouth daily.   gabapentin 100 MG capsule Commonly known as:  NEURONTIN Take 2 capsules (200 mg total) by mouth 3 (three) times daily.   lisinopril 10 MG tablet Commonly known as:  PRINIVIL,ZESTRIL Take 1 tablet (10 mg total) by mouth daily.   meclizine 25 MG tablet Commonly known as:  ANTIVERT Take 1 tablet (25 mg total) by mouth 3 (three) times daily as needed for dizziness (also available over the counter).   Melatonin 3 MG  Tabs Take 3 mg by mouth at bedtime.   metoprolol succinate 50 MG 24 hr tablet Commonly known as:  TOPROL-XL Take 1 tablet (50 mg total) by mouth daily. Take with or immediately following a meal.   mirtazapine 7.5 MG tablet Commonly known as:  REMERON Take 1 tablet (7.5 mg total) by mouth at bedtime.   nicotine 14 mg/24hr patch Commonly known as:   NICODERM CQ - dosed in mg/24 hours Place 1 patch (14 mg total) onto the skin daily.   predniSONE 20 MG tablet Commonly known as:  DELTASONE Take 2 tablets (40 mg total) by mouth daily with breakfast for 3 days. Start taking on:  07/10/2018   thiamine 100 MG tablet Take 1 tablet (100 mg total) by mouth daily.   Tiotropium Bromide Monohydrate 2.5 MCG/ACT Aers Inhale 2 puffs into the lungs daily.   UNABLE TO FIND OUTPATIENT PHYSICAL THERAPY (VESTIBULAR REHAB)  DIAGNOSIS: VERTIGO        Brief H and P: For complete details please refer to admission H and P, but in briefPatient is a 62 year old male with a history of tobacco abuse, COPD, alcoholism presented with 9-day history of generalized fatigue, not been eating for a week with no appetite, diarrhea.  Patient was hypotensive in the ED, presented severely hypovolemic.  BP improved after fluid resuscitation and vasopressors but eventually weaned off after 6 L of fluids.  He was found to have acute kidney injury, elevated troponins.  Cardiology was consulted.  2D echo and cardiac cath confirmed cardiomyopathy but no obstructive CAD.  Patient also developed severe alcohol withdrawals requiring transient Precedex infusion in ICU. Patient is now transferred to hospitalist service, assumed care on 9/29.   Hospital Course:  Elevated troponins likely due to Takotsubo cardiomyopathy - in the setting of alcohol withdrawal and severe hypovolemia, hypotension -Patient was admitted to ICU, troponin 6.5, cardiology was consulted -Patient underwent 2D echo, showed EF of 35 to 40% with grade 1 diastolic dysfunction, regional wall motion abnormalities. -Underwent cardiac cath showed severe left ventricular systolic dysfunction, EF less than 25%, no obstructive CAD consistent with Takotsubo's cardiomyopathy -Currently doing well, patient placed on beta-blocker, lisinopril, statin.  He will follow-up with cardiology, appointment scheduled.    Hypovolemic shock, hypotension -BP currently stable 135/58, continue metoprolol, lisinopril  Acute systolic and diastolic CHF likely precipitated due to Takotsubo syndrome -2D echo with EF of 35 to 40% with grade 1 diastolic dysfunction.  Cardiac cath with no obstructive CAD. 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, recommended outpatient PT for vestibular rehab -Meclizine as needed  Alcohol withdrawals -Patient required Precedex drip in ICU, currently stable, no agitation, continue CIWA scale with Ativan -B12, folate within normal limits, follow B1 level patient being discharged on thiamine daily  COPD with exacerbation -Has much improved patient placed on albuterol inhaler as needed, Spiriva, Breo -Placed on doxycycline, prednisone 40 mg daily for total 5 days   Nicotine abuse Recommended strongly to quit smoking, continue nicotine patch     Pressure injury of skin Stage I left heel, wound care per nursing  Hyponatremia Sodium 130 at the time of discharge, alert and oriented, follow BMET  Depression Patient had a recent admission in June 2019 to [facility].  He states that he does not take Abilify and Remeron had worked good in the past.  He was discharged on  Remeron 7.5 mg at bedtime, does not have further refills. Recommended patient to follow-up with his PCP and request referral to outpatient psychiatry  Day of Discharge S: Currently feeling better, wants to go home, no chest pain, shortness of breath, feeling back to baseline  BP (!) 135/58 (BP Location: Right Arm)   Pulse 77   Temp 97.8 F (36.6 C) (Oral)   Resp 20   Ht 5\' 9"  (1.753 m)   Wt 74.1 kg   SpO2 96%   BMI 24.12 kg/m   Physical Exam: General: Alert and awake oriented x3 not in any acute distress. HEENT: anicteric sclera, pupils  reactive to light and accommodation CVS: S1-S2 clear no murmur rubs or gallops Chest: clear to auscultation bilaterally, no wheezing rales or rhonchi Abdomen: soft nontender, nondistended, normal bowel sounds Extremities: no cyanosis, clubbing or edema noted bilaterally Neuro: Cranial nerves II-XII intact, no focal neurological deficits   The results of significant diagnostics from this hospitalization (including imaging, microbiology, ancillary and laboratory) are listed below for reference.      Procedures/Studies:  US Abdomen Complete  Result Date: 07/04/2018 CLINICAL DATA:  Abdominal pain EXAM: ABDOMEN ULTRASOUND COMPLETE COMPARISON:  03/02/2018 FINDINGS: Gallbladder: Normally distended without stones or wall thickening. No pericholecystic fluid or sonographic Murphy sign. Common bile duct: Diameter: Normal caliber 3 mm diameter Liver: Echogenic parenchyma, likely fatty infiltration though this can be seen with cirrhosis and certain infiltrative disorders. No focal hepatic mass or nodularity. Portal vein is patent on color Doppler imaging with normal direction of blood flow towards the liver. IVC: Normal appearance Pancreas: Portions of the pancreatic head through mid tail normal appearance, remainder obscured by bowel gas. Spleen: Normal appearance, 5.5 cm length Right Kidney: Length: 12.6 cm. Normal morphology without mass or hydronephrosis. Left Kidney: Length: 11.1 cm. Normal morphology without mass or hydronephrosis. Abdominal aorta: Proximal and mid portions are normal in caliber. Distally the aorta is obscured by bowel gas. Other findings: No free fluid IMPRESSION: Incomplete pancreatic and aortic visualization. Probable fatty infiltration of liver as above. No acute abnormalities otherwise identified. Electronically Signed   By: Lavonia Dana M.D.   On: 07/04/2018 16:28   Dg Chest Port 1 View  Result Date: 07/06/2018 CLINICAL DATA:  Respiratory failure EXAM: PORTABLE CHEST 1 VIEW  COMPARISON:  July 03, 2018 FINDINGS: There is airspace consolidation throughout much of the left upper lobe. There is more patchy opacity in the right mid lung region. There is interstitial prominence with probable mild pulmonary edema in the bases. Heart is borderline enlarged with pulmonary vascularity normal. No adenopathy. There is aortic atherosclerosis. No bone lesions. IMPRESSION: Airspace consolidation throughout much of the left upper lobe and to a much lesser degree in the right mid lung, likely foci of pneumonia. Suspect mild basilar interstitial edema. Stable cardiac prominence. There is aortic atherosclerosis. Aortic Atherosclerosis (ICD10-I70.0). Electronically Signed   By: Lowella Grip III M.D.   On: 07/06/2018 08:39   Dg Chest Portable 1 View  Result Date: 07/03/2018 CLINICAL DATA:  Syncopal episode, hypertension EXAM: PORTABLE CHEST 1 VIEW COMPARISON:  04/17/2018 FINDINGS: Defibrillator pads overlie the chest. Normal heart size and vascularity. Mild hyperinflation, suspect COPD/emphysema. Negative for edema, effusion, collapse, consolidation, or pneumothorax. Trachea is midline. Aorta is atherosclerotic. Bones are osteopenic IMPRESSION: No active disease. Electronically Signed   By: Jerilynn Mages.  Shick M.D.   On: 07/03/2018 16:50       LAB RESULTS: Basic Metabolic Panel: Recent Labs  Lab 07/06/18 0257  07/08/18 0341  07/09/18 0345  NA 130*   < > 131* 130*  K 3.7   < > 3.6 3.5  CL 101   < > 100 101  CO2 21*   < > 23 23  GLUCOSE 100*   < > 105* 101*  BUN 9   < > 10 14  CREATININE 0.83   < > 0.75 0.86  CALCIUM 8.2*   < > 8.1* 8.1*  MG 1.8  --   --   --   PHOS 2.8  --   --   --    < > = values in this interval not displayed.   Liver Function Tests: Recent Labs  Lab 07/03/18 1506 07/06/18 0257  AST 87* 71*  ALT 94* 71*  ALKPHOS 60 50  BILITOT 1.8* 0.9  PROT 6.6 5.3*  ALBUMIN 3.8 2.7*   No results for input(s): LIPASE, AMYLASE in the last 168 hours. No results for  input(s): AMMONIA in the last 168 hours. CBC: Recent Labs  Lab 07/03/18 1506 07/04/18 0327 07/06/18 0257  WBC 12.6* 11.9* 8.7  NEUTROABS 9.4*  --   --   HGB 15.3 14.0 12.4*  HCT 43.7 41.2 36.9*  MCV 92.0 94.3 93.9  PLT 216 160 156   Cardiac Enzymes: Recent Labs  Lab 07/04/18 1017 07/06/18 0257  TROPONINI 4.63* 0.97*   BNP: Invalid input(s): POCBNP CBG: Recent Labs  Lab 07/03/18 1453 07/05/18 0800  GLUCAP 175* 105*      Disposition and Follow-up: Discharge Instructions    Amb Referral to Cardiac Rehabilitation   Complete by:  As directed    Diagnosis:  NSTEMI   Ambulatory referral to Physical Therapy   Complete by:  As directed    Outpatient Physical therapy evaluation and treatment (vestibular)   Diet - low sodium heart healthy   Complete by:  As directed    Increase activity slowly   Complete by:  As directed        DISPOSITION: [place]    Minus Breeding, MD Follow up on 07/19/2018.   Specialty:  Cardiology Why:  at 8:20 AM Contact information: [address] [place] [address] [phone]        Janith Lima, MD. Schedule an appointment as soon as possible for a visit in 2 week(s).   Specialty:  Internal Medicine Why:  PLEASE ASK FOR REFERRAL TO PSYCHIATRY  Contact information: [address] [place] [address] [phone]        [facility] Follow up.   Specialty:  Rehabilitation Why:  Office to call the patient with an appointment time. If office has not called within 2-3 business days please call the office.  Contact information: [address] [place] [phone] [place] [phone]           Time coordinating discharge:  35 minutes  Signed:   Estill Cotta M.D. [facility] 07/09/2018, 12:03 PM Pager: [phone]

## 2018-07-09 NOTE — Discharge Instructions (Signed)
Weigh daily call the office if weight up 3 pounds in a day or 5 pounds in a week.   Low salt diet.

## 2018-07-10 ENCOUNTER — Telehealth (HOSPITAL_COMMUNITY): Payer: Self-pay

## 2018-07-10 ENCOUNTER — Telehealth: Payer: Self-pay | Admitting: *Deleted

## 2018-07-10 LAB — VITAMIN B1: Vitamin B1 (Thiamine): 153 nmol/L (ref 66.5–200.0)

## 2018-07-10 NOTE — Telephone Encounter (Signed)
Transition Care Management Follow-up Telephone Call   Date discharged? 07/09/18   How have you been since you were released from the hospital? Pt states he is doing alright   Do you understand why you were in the hospital? YES   Do you understand the discharge instructions? YES   Where were you discharged to? Home   Items Reviewed:  Medications reviewed: YES  Allergies reviewed: YES  Dietary changes reviewed: NO  Referrals reviewed: YES, will need to ask Dr. Ronnald Ramp for referral to Psychiatry    Functional Questionnaire:  Activities of Daily Living (ADLs):   He states he are independent in the following: ambulation, bathing and hygiene, feeding, continence, grooming, toileting and dressing States he doesn't require assistance    Any transportation issues/concerns?: NO   Any patient concerns? NO   Confirmed importance and date/time of follow-up visits scheduled YES, appt 07/22/18  Provider Appointment booked with Dr. Ronnald Ramp  Confirmed with patient if condition begins to worsen call PCP or go to the ER.  Patient was given the office number and encouraged to call back with question or concerns.  : YES

## 2018-07-10 NOTE — Telephone Encounter (Signed)
Attempted to call patient in regards to Cardiac Rehab - LM on VM 

## 2018-07-10 NOTE — Telephone Encounter (Signed)
Pt insurance is active and benefits verified through [employer]. Co-pay $0.00, DED $250.00/$250.00 met, out of pocket $600.00/$600.00 met, co-insurance 30%. No pre-authorization. Passport, 07/10/18 @ 11:19AM, REF# [id number]  Will contact patient to see if he is interested in the Cardiac Rehab Program. If interested, patient will need to complete follow up appt. Once completed, patient will be contacted for scheduling upon review by the RN Navigator.

## 2018-07-18 NOTE — Progress Notes (Signed)
Cardiology Office Note   Date:  07/19/2018   ID:  Oscar Phillips, Oscar Phillips, MRN [id number]  PCP:  Janith Lima, MD  Cardiologist:   Minus Breeding, MD   Chief Complaint  Patient presents with  . Cardiomyopathy      History of Present Illness: Oscar Phillips is a 62 y.o. male who presents for evaluation of a cardiomyopathy.  He presented with shock and was found to have a reduced EF but with no obstructive CAD as below.  Since going home he has had no new cardiovascular complaints.  He has some chronic dyspnea which she ascribes to his smoking.  He is not drinking alcohol.  His biggest issues have been with decreased appetite and anhedonia.  He wonders if he could be depressed.  His biggest issue has been anxiety in the past but not as much depression.  He is still doing the activities around his house but he has no interest in his art work.  He denies any chest pressure, neck or arm discomfort..  He has had no weight gain or edema.  He does have some dizziness and lightheadedness.   Past Medical History:  Diagnosis Date  . Abdominal bruit   . Alcoholism ([place])   . Anxiety   . B12 deficiency   . Bronchitis   . COPD (chronic obstructive pulmonary disease) ([place])   . GERD (gastroesophageal reflux disease)   . H/O trichomonal urethritis   . History of panic attacks   . History of rectal bleeding    in August 2016  . HLD (hyperlipidemia)   . HTN (hypertension)   . OCD (obsessive compulsive disorder)   . PSA elevation     Past Surgical History:  Procedure Laterality Date  . ELBOW FRACTURE SURGERY Left   . LEFT HEART CATH AND CORONARY ANGIOGRAPHY N/A 07/04/2018   Procedure: LEFT HEART CATH AND CORONARY ANGIOGRAPHY;  Surgeon: Belva Crome, MD;  Location: [facility];  Service: Cardiovascular;  Laterality: N/A;  . LUMBAR [place]    . TONSILLECTOMY  1965     Current Outpatient Medications  Medication Sig Dispense Refill  . albuterol (PROVENTIL  HFA;VENTOLIN HFA) 108 (90 Base) MCG/ACT inhaler Inhale 2 puffs into the lungs every 6 (six) hours as needed for wheezing or shortness of breath. 1 Inhaler 2  . atorvastatin (LIPITOR) 40 MG tablet Take 20 mg by mouth daily at 6 PM.    . dexlansoprazole (DEXILANT) 60 MG capsule Take 1 capsule (60 mg total) by mouth daily. 30 capsule 1  . fluticasone furoate-vilanterol (BREO ELLIPTA) 100-25 MCG/INH AEPB Inhale 1 puff into the lungs daily. 60 each 1  . folic acid (FOLVITE) 1 MG tablet Take 1 tablet (1 mg total) by mouth daily. 30 tablet 1  . gabapentin (NEURONTIN) 100 MG capsule Take 2 capsules (200 mg total) by mouth 3 (three) times daily. 180 capsule 1  . lisinopril (PRINIVIL,ZESTRIL) 10 MG tablet Take 1 tablet (10 mg total) by mouth daily. 30 tablet 3  . meclizine (ANTIVERT) 25 MG tablet Take 1 tablet (25 mg total) by mouth 3 (three) times daily as needed for dizziness (also available over the counter). 30 tablet 0  . Melatonin 3 MG TABS Take 3 mg by mouth at bedtime.    . metoprolol succinate (TOPROL-XL) 50 MG 24 hr tablet Take 1 tablet (50 mg total) by mouth daily. Take with or immediately following a meal. 30 tablet 3  . mirtazapine (REMERON) 7.5 MG  tablet Take 1 tablet (7.5 mg total) by mouth at bedtime. 30 tablet 0  . Theanine 100 MG CAPS Take 1 tablet by mouth daily.    Marland Kitchen thiamine 100 MG tablet Take 1 tablet (100 mg total) by mouth daily. 30 tablet 0  . Tiotropium Bromide Monohydrate (SPIRIVA RESPIMAT) 2.5 MCG/ACT AERS Inhale 2 puffs into the lungs daily. 12 g 1   No current facility-administered medications for this visit.     Allergies:   Penicillins    ROS:  Please see the history of present illness.   Otherwise, review of systems are positive for none.   All other systems are reviewed and negative.    PHYSICAL EXAM: VS:  BP 136/62   Pulse 92   Ht 5\' 9"  (1.753 m)   Wt 150 lb (68 kg)   SpO2 95%   BMI 22.15 kg/m  , BMI Body mass index is 22.15 kg/m. GENERAL:  Well  appearing HEENT:  Pupils equal round and reactive, fundi not visualized, oral mucosa unremarkable NECK:  No jugular venous distention, waveform within normal limits, carotid upstroke brisk and symmetric, no bruits, no thyromegaly LYMPHATICS:  No cervical, inguinal adenopathy LUNGS:  Clear to auscultation bilaterally BACK:  No CVA tenderness CHEST:  Unremarkable HEART:  PMI not displaced or sustained,S1 and S2 within normal limits, no S3, no S4, no clicks, no rubs, no murmurs ABD:  Flat, positive bowel sounds normal in frequency in pitch, no bruits, no rebound, no guarding, no midline pulsatile mass, no hepatomegaly, no splenomegaly EXT:  2 plus pulses throughout, no edema, no cyanosis no clubbing SKIN:  No rashes no nodules NEURO:  Cranial nerves II through XII grossly intact, motor grossly intact throughout PSYCH:  Cognitively intact, oriented to person place and time   CATH 9/26   Widely patent left main.  Segmental 50% LAD narrowing.  Large first diagonal with 70% stenosis in the mid to distal vessel.  Large normal ramus intermedius  Circumflex coronary gives origin to 3 tiny obtuse marginal branches.  50-60 % stenosis noted in the mid body of the circumflex.  Dominant right coronary with luminal irregularities.  Apical ballooning identified on left ventriculography consistent with Takotsubo cardiomyopathy  Severe left ventricular systolic dysfunction with EF less than 25% and LVEDP 24 mmHg  Cardiogenic shock on presentation now improving and patient is off pressor therapy.    EKG:  EKG is not ordered today.    Recent Labs: 07/03/2018: TSH 1.334 07/06/2018: ALT 71; B Natriuretic Peptide 1,708.4; Hemoglobin 12.4; Magnesium 1.8; Platelets 156 07/09/2018: BUN 14; Creatinine, Ser 0.86; Potassium 3.5; Sodium 130    Lipid Panel    Component Value Date/Time   CHOL 109 07/05/2018 0256   TRIG 66 07/05/2018 0256   HDL 50 07/05/2018 0256   CHOLHDL 2.2 07/05/2018 0256   VLDL  13 07/05/2018 0256   LDLCALC 46 07/05/2018 0256   LDLDIRECT 154.1 06/03/2010 1421      Wt Readings from Last 3 Encounters:  07/19/18 150 lb (68 kg)  07/09/18 163 lb 4.8 oz (74.1 kg)  04/17/18 148 lb (67.1 kg)      Other studies Reviewed: Additional studies/ records that were reviewed today include: Hospital records. Review of the above records demonstrates:  Please see elsewhere in the note.     ASSESSMENT AND PLAN:  NON ISCHEMIC CARDIOMYOPATHY:   This is either a Takotsubo or alcoholic cardiomyopathy.  However, currently he is not feeling well so not can titrate his medications.  I  will reduce them either.  We talked about the possible pathophysiology.  He seems to be euvolemic.  I will see him back in about 3 months and we can make plans for further med titration.  ETOH ABUSE: He says he is quit drinking and I encouraged this and he understands the importance of abstinence if this is related to his cardiomyopathy.  NONOBSTRUCTIVE CAD: He is not having any symptoms and we will manage this medically.  HTN:  This is being managed in the context of treating his CHF  TOBACCO ABUSE:   He is trying to quit.    DYSLIPIDEMIA:  I will reduce the Lipitor to 20 mg.    DEPRESSION: I think he is probably having this issue.  I have asked him to talk with Dr. Ronnald Ramp about this and he sees him on Monday.  Current medicines are reviewed at length with the patient today.  The patient does not have concerns regarding medicines.  The following changes have been made:  As above  Labs/ tests ordered today include: None No orders of the defined types were placed in this encounter.    Disposition:   FU with APP in three months.     Signed, Minus Breeding, MD  07/19/2018 8:57 AM    [facility]

## 2018-07-19 ENCOUNTER — Encounter: Payer: Self-pay | Admitting: Cardiology

## 2018-07-19 ENCOUNTER — Telehealth (HOSPITAL_COMMUNITY): Payer: Self-pay | Admitting: *Deleted

## 2018-07-19 ENCOUNTER — Ambulatory Visit: Payer: BLUE CROSS/BLUE SHIELD | Admitting: Cardiology

## 2018-07-19 VITALS — BP 136/62 | HR 92 | Ht 69.0 in | Wt 150.0 lb

## 2018-07-19 DIAGNOSIS — I5021 Acute systolic (congestive) heart failure: Secondary | ICD-10-CM

## 2018-07-19 DIAGNOSIS — I10 Essential (primary) hypertension: Secondary | ICD-10-CM | POA: Diagnosis not present

## 2018-07-19 DIAGNOSIS — E785 Hyperlipidemia, unspecified: Secondary | ICD-10-CM | POA: Diagnosis not present

## 2018-07-19 DIAGNOSIS — I2510 Atherosclerotic heart disease of native coronary artery without angina pectoris: Secondary | ICD-10-CM

## 2018-07-19 NOTE — Patient Instructions (Signed)
Medication Instructions:  DECREASE- Atorvastatin 20 mg daily  If you need a refill on your cardiac medications before your next appointment, please call your pharmacy.  Labwork: None Ordered   If you have labs (blood work) drawn today and your tests are completely normal, you will receive your results only by: Marland Kitchen MyChart Message (if you have MyChart) OR . A paper copy in the mail If you have any lab test that is abnormal or we need to change your treatment, we will call you to review the results.  Testing/Procedures: None Ordered   Follow-Up: You will need a follow up appointment in 3 Months with APP . Rhonda Barrett, PA-C . Jory Sims, DNP, ANP  At [facility], you and your health needs are our priority.  As part of our continuing mission to provide you with exceptional heart care, we have created designated Provider Care Teams.  These Care Teams include your primary Cardiologist (physician) and Advanced Practice Providers (APPs -  Physician Assistants and Nurse Practitioners) who all work together to provide you with the care you need, when you need it.   Thank you for choosing [facility] at [facility]!!

## 2018-07-19 NOTE — Telephone Encounter (Signed)
Clinical review of pt follow up appt on 07/19/18 cardiologist office note with Dr. Percival Spanish. Pt is making the expected progress in recovery.  Pt appropriate for scheduling for cardiac rehab.  Will forward to support staff for scheduling with pt  consent.  Noel Christmas

## 2018-07-22 ENCOUNTER — Ambulatory Visit: Payer: BLUE CROSS/BLUE SHIELD | Admitting: Internal Medicine

## 2018-07-22 ENCOUNTER — Other Ambulatory Visit (INDEPENDENT_AMBULATORY_CARE_PROVIDER_SITE_OTHER): Payer: BLUE CROSS/BLUE SHIELD

## 2018-07-22 ENCOUNTER — Telehealth (HOSPITAL_COMMUNITY): Payer: Self-pay

## 2018-07-22 ENCOUNTER — Encounter: Payer: Self-pay | Admitting: Internal Medicine

## 2018-07-22 VITALS — BP 130/60 | HR 90 | Temp 98.4°F | Resp 16 | Ht 69.0 in | Wt 149.2 lb

## 2018-07-22 DIAGNOSIS — J449 Chronic obstructive pulmonary disease, unspecified: Secondary | ICD-10-CM | POA: Diagnosis not present

## 2018-07-22 DIAGNOSIS — Z23 Encounter for immunization: Secondary | ICD-10-CM

## 2018-07-22 DIAGNOSIS — I2510 Atherosclerotic heart disease of native coronary artery without angina pectoris: Secondary | ICD-10-CM | POA: Diagnosis not present

## 2018-07-22 DIAGNOSIS — I10 Essential (primary) hypertension: Secondary | ICD-10-CM

## 2018-07-22 DIAGNOSIS — J4489 Other specified chronic obstructive pulmonary disease: Secondary | ICD-10-CM

## 2018-07-22 DIAGNOSIS — R972 Elevated prostate specific antigen [PSA]: Secondary | ICD-10-CM

## 2018-07-22 DIAGNOSIS — E538 Deficiency of other specified B group vitamins: Secondary | ICD-10-CM

## 2018-07-22 DIAGNOSIS — F332 Major depressive disorder, recurrent severe without psychotic features: Secondary | ICD-10-CM

## 2018-07-22 LAB — COMPREHENSIVE METABOLIC PANEL
ALK PHOS: 84 U/L (ref 39–117)
ALT: 29 U/L (ref 0–53)
AST: 18 U/L (ref 0–37)
Albumin: 4.1 g/dL (ref 3.5–5.2)
BILIRUBIN TOTAL: 0.4 mg/dL (ref 0.2–1.2)
BUN: 12 mg/dL (ref 6–23)
CO2: 33 meq/L — AB (ref 19–32)
CREATININE: 0.93 mg/dL (ref 0.40–1.50)
Calcium: 9.3 mg/dL (ref 8.4–10.5)
Chloride: 102 mEq/L (ref 96–112)
GFR: 87.24 mL/min (ref >60.00)
GLUCOSE: 101 mg/dL — AB (ref 70–99)
Potassium: 4.1 mEq/L (ref 3.5–5.1)
SODIUM: 139 meq/L (ref 135–145)
TOTAL PROTEIN: 7.2 g/dL (ref 6.0–8.3)

## 2018-07-22 LAB — PSA: PSA: 5.2

## 2018-07-22 LAB — CBC WITH DIFFERENTIAL/PLATELET
Basophils Absolute: 0.1 10*3/uL (ref 0.0–0.1)
Basophils Relative: 1.3 % (ref 0.0–3.0)
EOS ABS: 0.1 10*3/uL (ref 0.0–0.7)
EOS PCT: 0.6 % (ref 0.0–5.0)
HCT: 43.5 % (ref 39.0–52.0)
HEMOGLOBIN: 14.5 g/dL (ref 13.0–17.0)
LYMPHS PCT: 23.3 % (ref 12.0–46.0)
Lymphs Abs: 2.1 10*3/uL (ref 0.7–4.0)
MCHC: 33.3 g/dL (ref 30.0–36.0)
MCV: 94.3 fl (ref 78.0–100.0)
MONOS PCT: 7.5 % (ref 3.0–12.0)
Monocytes Absolute: 0.7 10*3/uL (ref 0.1–1.0)
NEUTROS PCT: 67.3 % (ref 43.0–77.0)
Neutro Abs: 6 10*3/uL (ref 1.4–7.7)
PLATELETS: 374 10*3/uL (ref 150.0–400.0)
RBC: 4.61 Mil/uL (ref 4.22–5.81)
RDW: 13.9 % (ref 11.5–15.5)
WBC: 8.9 10*3/uL (ref 4.0–10.5)

## 2018-07-22 MED ORDER — TIOTROPIUM BROMIDE MONOHYDRATE 2.5 MCG/ACT IN AERS: 2.0000 | INHALATION_SPRAY | Freq: Every day | RESPIRATORY_TRACT | 1 refills | Status: DC

## 2018-07-22 MED ORDER — OLMESARTAN MEDOXOMIL 40 MG PO TABS: 40.0000 mg | ORAL_TABLET | Freq: Every day | ORAL | 1 refills | Status: DC

## 2018-07-22 MED ORDER — FLUTICASONE FUROATE-VILANTEROL 100-25 MCG/INH IN AEPB: 1.0000 | INHALATION_SPRAY | Freq: Every day | RESPIRATORY_TRACT | 1 refills | Status: DC

## 2018-07-22 NOTE — Patient Instructions (Signed)
Anemia Anemia is a condition in which you do not have enough red blood cells or hemoglobin. Hemoglobin is a substance in red blood cells that carries oxygen. When you do not have enough red blood cells or hemoglobin (are anemic), your body cannot get enough oxygen and your organs may not work properly. As a result, you may feel very tired or have other problems. What are the causes? Common causes of anemia include:  Excessive bleeding. Anemia can be caused by excessive bleeding inside or outside the body, including bleeding from the intestine or from periods in women.  Poor nutrition.  Long-lasting (chronic) kidney, thyroid, and liver disease.  Bone marrow disorders.  Cancer and treatments for cancer.  HIV (human immunodeficiency virus) and AIDS (acquired immunodeficiency syndrome).  Treatments for HIV and AIDS.  Spleen problems.  Blood disorders.  Infections, medicines, and autoimmune disorders that destroy red blood cells.  What are the signs or symptoms? Symptoms of this condition include:  Minor weakness.  Dizziness.  Headache.  Feeling heartbeats that are irregular or faster than normal (palpitations).  Shortness of breath, especially with exercise.  Paleness.  Cold sensitivity.  Indigestion.  Nausea.  Difficulty sleeping.  Difficulty concentrating.  Symptoms may occur suddenly or develop slowly. If your anemia is mild, you may not have symptoms. How is this diagnosed? This condition is diagnosed based on:  Blood tests.  Your medical history.  A physical exam.  Bone marrow biopsy.  Your health care provider may also check your stool (feces) for blood and may do additional testing to look for the cause of your bleeding. You may also have other tests, including:  Imaging tests, such as a CT scan or MRI.  Endoscopy.  Colonoscopy.  How is this treated? Treatment for this condition depends on the cause. If you continue to lose a lot of blood,  you may need to be treated at a hospital. Treatment may include:  Taking supplements of iron, vitamin B12, or folic acid.  Taking a hormone medicine (erythropoietin) that can help to stimulate red blood cell growth.  Having a blood transfusion. This may be needed if you lose a lot of blood.  Making changes to your diet.  Having surgery to remove your spleen.  Follow these instructions at home:  Take over-the-counter and prescription medicines only as told by your health care provider.  Take supplements only as told by your health care provider.  Follow any diet instructions that you were given.  Keep all follow-up visits as told by your health care provider. This is important. Contact a health care provider if:  You develop new bleeding anywhere in the body. Get help right away if:  You are very weak.  You are short of breath.  You have pain in your abdomen or chest.  You are dizzy or feel faint.  You have trouble concentrating.  You have bloody or black, tarry stools.  You vomit repeatedly or you vomit up blood. Summary  Anemia is a condition in which you do not have enough red blood cells or enough of a substance in your red blood cells that carries oxygen (hemoglobin).  Symptoms may occur suddenly or develop slowly.  If your anemia is mild, you may not have symptoms.  This condition is diagnosed with blood tests as well as a medical history and physical exam. Other tests may be needed.  Treatment for this condition depends on the cause of the anemia. This information is not intended to replace advice   given to you by your health care provider. Make sure you discuss any questions you have with your health care provider. Document Released: 11/02/2004 Document Revised: 10/27/2016 Document Reviewed: 10/27/2016 Elsevier Interactive Patient Education  [employer].

## 2018-07-22 NOTE — Telephone Encounter (Signed)
Tried to speak with patient in regards to Cardiac Rehab - phone kept going in and out. Attempted twice. Will try again if patient does not return phone call.

## 2018-07-22 NOTE — Progress Notes (Signed)
Subjective:  Patient ID: Oscar Phillips, male    DOB: 02-26-1956  Age: 62 y.o. MRN: [id number]  CC: Cough and Anemia   HPI Oscar Phillips presents for f/up - He was admitted about 3 weeks ago for hyponatremia and cardiomyopathy.  It is thought the cardiomyopathy is related to alcohol toxicity.  Fortunately, he has not had a drink in the last 3 weeks.  During the hospitalization he was started on an ACE inhibitor and since then he complains that his cough has worsened.  The cough is rarely productive of clear phlegm.  He denies hemoptysis, chest pain, shortness of breath, wheezing, or diaphoresis.  He was also found to be anemic.  He is compliant with the thiamine and folate supplements.  He remains depressed and wants to try a higher dose of mirtazapine.  Outpatient Medications Prior to Visit  Medication Sig Dispense Refill  . albuterol (PROVENTIL HFA;VENTOLIN HFA) 108 (90 Base) MCG/ACT inhaler Inhale 2 puffs into the lungs every 6 (six) hours as needed for wheezing or shortness of breath. 1 Inhaler 2  . atorvastatin (LIPITOR) 40 MG tablet Take 20 mg by mouth daily at 6 PM.    . dexlansoprazole (DEXILANT) 60 MG capsule Take 1 capsule (60 mg total) by mouth daily. 30 capsule 1  . folic acid (FOLVITE) 1 MG tablet Take 1 tablet (1 mg total) by mouth daily. 30 tablet 1  . gabapentin (NEURONTIN) 100 MG capsule Take 2 capsules (200 mg total) by mouth 3 (three) times daily. 180 capsule 1  . Melatonin 3 MG TABS Take 3 mg by mouth at bedtime.    . metoprolol succinate (TOPROL-XL) 50 MG 24 hr tablet Take 1 tablet (50 mg total) by mouth daily. Take with or immediately following a meal. 30 tablet 3  . Theanine 100 MG CAPS Take 1 tablet by mouth daily.    Marland Kitchen thiamine 100 MG tablet Take 1 tablet (100 mg total) by mouth daily. 30 tablet 0  . fluticasone furoate-vilanterol (BREO ELLIPTA) 100-25 MCG/INH AEPB Inhale 1 puff into the lungs daily. 60 each 1  . lisinopril (PRINIVIL,ZESTRIL) 10 MG tablet Take 1  tablet (10 mg total) by mouth daily. 30 tablet 3  . meclizine (ANTIVERT) 25 MG tablet Take 1 tablet (25 mg total) by mouth 3 (three) times daily as needed for dizziness (also available over the counter). 30 tablet 0  . mirtazapine (REMERON) 7.5 MG tablet Take 1 tablet (7.5 mg total) by mouth at bedtime. 30 tablet 0  . Tiotropium Bromide Monohydrate (SPIRIVA RESPIMAT) 2.5 MCG/ACT AERS Inhale 2 puffs into the lungs daily. 12 g 1   No facility-administered medications prior to visit.     ROS Review of Systems  Constitutional: Positive for appetite change. Negative for diaphoresis, fatigue and unexpected weight change.  HENT: Negative.  Negative for congestion and trouble swallowing.   Eyes: Negative for visual disturbance.  Respiratory: Positive for cough and shortness of breath. Negative for choking and wheezing.   Cardiovascular: Negative for chest pain, palpitations and leg swelling.  Gastrointestinal: Negative for abdominal pain, constipation, diarrhea, nausea and vomiting.  Endocrine: Negative.   Genitourinary: Negative.  Negative for difficulty urinating and dysuria.  Musculoskeletal: Negative.  Negative for arthralgias, back pain and myalgias.  Skin: Negative.  Negative for color change, pallor, rash and wound.  Neurological: Negative.  Negative for dizziness, weakness, light-headedness and headaches.  Hematological: Negative for adenopathy. Does not bruise/bleed easily.  Psychiatric/Behavioral: Positive for decreased concentration, dysphoric mood and sleep disturbance.  Negative for confusion, hallucinations, self-injury and suicidal ideas. The patient is nervous/anxious. The patient is not hyperactive.     Objective:  BP 130/60 (BP Location: Left Arm, Patient Position: Sitting, Cuff Size: Normal)   Pulse 90   Temp 98.4 F (36.9 C) (Oral)   Resp 16   Ht 5\' 9"  (1.753 m)   Wt 149 lb 4 oz (67.7 kg)   SpO2 93%   BMI 22.04 kg/m   BP Readings from Last 3 Encounters:  07/22/18  130/60  07/19/18 136/62  07/09/18 (!) 135/58    Wt Readings from Last 3 Encounters:  07/22/18 149 lb 4 oz (67.7 kg)  07/19/18 150 lb (68 kg)  07/09/18 163 lb 4.8 oz (74.1 kg)    Physical Exam  Constitutional: He is oriented to person, place, and time. No distress.  HENT:  Mouth/Throat: Oropharynx is clear and moist. No oropharyngeal exudate.  Eyes: Conjunctivae are normal. No scleral icterus.  Neck: Normal range of motion. Neck supple. No JVD present. No thyromegaly present.  Cardiovascular: Normal rate, regular rhythm and normal heart sounds. Exam reveals no gallop and no friction rub.  No murmur heard. Pulmonary/Chest: Effort normal and breath sounds normal. No respiratory distress. He has no wheezes. He has no rales.  Abdominal: Soft. Bowel sounds are normal. He exhibits no mass. There is no hepatosplenomegaly. There is no tenderness.  Musculoskeletal: Normal range of motion. He exhibits no edema, tenderness or deformity.  Lymphadenopathy:    He has no cervical adenopathy.  Neurological: He is alert and oriented to person, place, and time.  Skin: Skin is warm and dry. No rash noted. He is not diaphoretic. No erythema.  Psychiatric: He has a normal mood and affect. His behavior is normal. Judgment and thought content normal.  Vitals reviewed.   Lab Results  Component Value Date   WBC 8.9 07/22/2018   HGB 14.5 07/22/2018   HCT 43.5 07/22/2018   PLT 374.0 07/22/2018   GLUCOSE 101 (H) 07/22/2018   CHOL 109 07/05/2018   TRIG 66 07/05/2018   HDL 50 07/05/2018   LDLDIRECT 154.1 06/03/2010   LDLCALC 46 07/05/2018   ALT 29 07/22/2018   AST 18 07/22/2018   NA 139 07/22/2018   K 4.1 07/22/2018   CL 102 07/22/2018   CREATININE 0.93 07/22/2018   BUN 12 07/22/2018   CO2 33 (H) 07/22/2018   TSH 1.334 07/03/2018   PSA 5.2 07/22/2018   INR 0.86 07/03/2018   HGBA1C 5.5 03/02/2018    US Abdomen Complete  Result Date: 07/04/2018 CLINICAL DATA:  Abdominal pain EXAM: ABDOMEN  ULTRASOUND COMPLETE COMPARISON:  03/02/2018 FINDINGS: Gallbladder: Normally distended without stones or wall thickening. No pericholecystic fluid or sonographic Murphy sign. Common bile duct: Diameter: Normal caliber 3 mm diameter Liver: Echogenic parenchyma, likely fatty infiltration though this can be seen with cirrhosis and certain infiltrative disorders. No focal hepatic mass or nodularity. Portal vein is patent on color Doppler imaging with normal direction of blood flow towards the liver. IVC: Normal appearance Pancreas: Portions of the pancreatic head through mid tail normal appearance, remainder obscured by bowel gas. Spleen: Normal appearance, 5.5 cm length Right Kidney: Length: 12.6 cm. Normal morphology without mass or hydronephrosis. Left Kidney: Length: 11.1 cm. Normal morphology without mass or hydronephrosis. Abdominal aorta: Proximal and mid portions are normal in caliber. Distally the aorta is obscured by bowel gas. Other findings: No free fluid IMPRESSION: Incomplete pancreatic and aortic visualization. Probable fatty infiltration of liver as above. No  acute abnormalities otherwise identified. Electronically Signed   By: Lavonia Dana M.D.   On: 07/04/2018 16:28   Dg Chest Portable 1 View  Result Date: 07/03/2018 CLINICAL DATA:  Syncopal episode, hypertension EXAM: PORTABLE CHEST 1 VIEW COMPARISON:  04/17/2018 FINDINGS: Defibrillator pads overlie the chest. Normal heart size and vascularity. Mild hyperinflation, suspect COPD/emphysema. Negative for edema, effusion, collapse, consolidation, or pneumothorax. Trachea is midline. Aorta is atherosclerotic. Bones are osteopenic IMPRESSION: No active disease. Electronically Signed   By: Jerilynn Mages.  Shick M.D.   On: 07/03/2018 16:50    Assessment & Plan:   Oscar Phillips was seen today for cough and anemia.  Diagnoses and all orders for this visit:  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 exacerbation ([place])- Will continue triple therapy -     Tiotropium  Bromide Monohydrate (SPIRIVA RESPIMAT) 2.5 MCG/ACT AERS; Inhale 2 puffs into the lungs daily. -     fluticasone furoate-vilanterol (BREO ELLIPTA) 100-25 MCG/INH AEPB; Inhale 1 puff into the lungs daily.  B12 deficiency- His H/H are normal, will cont B12 replacement tx -     CBC with Differential/Platelet; Future  Essential hypertension- His blood pressure is adequately well controlled but it sounds like the ACE inhibitor has caused a cough.  I have asked him to change to an ARB.  His electrolytes and renal function are normal. -     olmesartan (BENICAR) 40 MG tablet; Take 1 tablet (40 mg total) by mouth daily. -     Comprehensive metabolic panel; Future  Coronary artery disease involving native coronary artery of native heart without angina pectoris- Will continue risk factor modifications. -     olmesartan (BENICAR) 40 MG tablet; Take 1 tablet (40 mg total) by mouth daily.  PSA elevation- His PSA remains mildly elevated and his free percentage is low which means that he has a greater than 50% chance of having prostate cancer.  I have again referred him to urology to consider screening for prostate cancer. -     PSA, total and free; Future -     Ambulatory referral to Urology  Need for Tdap vaccination -     Tdap vaccine greater than or equal to 7yo IM  Severe episode of recurrent major depressive disorder, without psychotic features ([place])- Will try a higher dose of remeron. -     mirtazapine (REMERON) 15 MG tablet; Take 1 tablet (15 mg total) by mouth at bedtime.   I have discontinued Oscar Phillips's lisinopril, mirtazapine, and meclizine. I am also having him start on olmesartan and mirtazapine. Additionally, I am having him maintain his albuterol, gabapentin, Melatonin, metoprolol succinate, dexlansoprazole, folic acid, thiamine, Theanine, atorvastatin, Tiotropium Bromide Monohydrate, and fluticasone furoate-vilanterol.  Meds ordered this encounter  Medications  . Tiotropium Bromide  Monohydrate (SPIRIVA RESPIMAT) 2.5 MCG/ACT AERS    Sig: Inhale 2 puffs into the lungs daily.    Dispense:  12 g    Refill:  1  . fluticasone furoate-vilanterol (BREO ELLIPTA) 100-25 MCG/INH AEPB    Sig: Inhale 1 puff into the lungs daily.    Dispense:  90 each    Refill:  1  . olmesartan (BENICAR) 40 MG tablet    Sig: Take 1 tablet (40 mg total) by mouth daily.    Dispense:  90 tablet    Refill:  1  . mirtazapine (REMERON) 15 MG tablet    Sig: Take 1 tablet (15 mg total) by mouth at bedtime.    Dispense:  90 tablet    Refill:  1  Follow-up: Return in about 3 months (around 10/22/2018).  Scarlette Calico, MD

## 2018-07-23 MED ORDER — MIRTAZAPINE 15 MG PO TABS: 15.0000 mg | ORAL_TABLET | Freq: Every day | ORAL | 1 refills | Status: DC

## 2018-07-24 LAB — PSA, TOTAL AND FREE
PSA, % Free: 10 % (calc) — ABNORMAL LOW (ref >25)
PSA, FREE: 0.5 ng/mL
PSA, Total: 5.2 ng/mL — ABNORMAL HIGH (ref <=4.0)

## 2018-07-31 ENCOUNTER — Encounter (HOSPITAL_COMMUNITY): Payer: Self-pay

## 2018-07-31 NOTE — Telephone Encounter (Signed)
Attempted to call pt a 2nd time- LM ON VM ° °Mailed letter out °

## 2018-08-07 ENCOUNTER — Other Ambulatory Visit: Payer: Self-pay | Admitting: Internal Medicine

## 2018-08-07 NOTE — Telephone Encounter (Signed)
Attempted to call pt a 3rd time - Lm on VM

## 2018-08-30 ENCOUNTER — Encounter: Payer: Self-pay | Admitting: Internal Medicine

## 2018-09-25 DIAGNOSIS — F429 Obsessive-compulsive disorder, unspecified: Secondary | ICD-10-CM | POA: Diagnosis not present

## 2018-10-03 ENCOUNTER — Telehealth: Payer: Self-pay | Admitting: Internal Medicine

## 2018-10-03 NOTE — Telephone Encounter (Signed)
Copied from [place] [id number]. Topic: General - Inquiry >> Oct 03, 2018 11:07 AM Virl Axe D wrote: Reason for CRM: Pt stated he hasn't seen Dr. Ronnald Ramp since 07/22/18. He has a heart condition and will be seeing a specialist soon. He wants to know if Dr. Ronnald Ramp needs to see him prior to this. Please advise.

## 2018-10-07 NOTE — Telephone Encounter (Signed)
Routing to dr jones, please advise, thanks 

## 2018-11-05 ENCOUNTER — Encounter: Payer: Self-pay | Admitting: Physician Assistant

## 2018-11-05 NOTE — Progress Notes (Signed)
Cardiology Office Note   Date:  11/08/2018   ID:  Oscar Phillips, DOB 1956-02-17, MRN [id number]  PCP:  Janith Lima, MD Cardiologist:  Minus Breeding, MD 07/19/2018 Rosaria Ferries, PA-C   No chief complaint on file.   History of Present Illness: Oscar Phillips is a 63 y.o. 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   He began getting muscle twitches w/ the higher dose of Remeron, decreased to previous dose. Is seeing psychiatrist now, he will manage psych drugs. Has appt next week.   Pt c/o upper arm pain. He gets this when he moves his arms getting dressed and it is worse when he does upper body work. Does not get with walking up hills or lower body exertion.   He gets pain in his feet, not much when waking, but gets worse during the day. Gabapentin helps but he feels spacey from all the meds.   Still has no appetite.  Still smokes. Has trouble breaking the habit.   Has no desire to drink. No alcohol.  Has not taken BP meds today because he has not eaten.   He has not been exercising, says he is vegging a lot. Thinks the weight gain is from snacking.   He gets minor chest pain across his chest, not exertional, may be related to smoking.   No new DOE, no orthopnea or PND.    Past Medical History:  Diagnosis Date  . Abdominal bruit   . Alcoholism ([place])   . Anxiety   . B12 deficiency   . Bronchitis   . COPD (chronic obstructive pulmonary disease) ([place])   . GERD (gastroesophageal reflux disease)   . H/O trichomonal urethritis   . History of panic attacks   . History of rectal bleeding    in  August 2016  . HLD (hyperlipidemia)   . HTN (hypertension)   . OCD (obsessive compulsive disorder)   . PSA elevation     Past Surgical History:  Procedure Laterality Date  . ELBOW FRACTURE SURGERY Left   . LEFT HEART CATH AND CORONARY ANGIOGRAPHY N/A 07/04/2018   Procedure: LEFT HEART CATH AND CORONARY ANGIOGRAPHY;  Surgeon: Belva Crome, MD;  Location: [facility];  Service: Cardiovascular;  Laterality: N/A;  . LUMBAR [place]    . TONSILLECTOMY  1965    Current Outpatient Medications  Medication Sig Dispense Refill  . fluticasone furoate-vilanterol (BREO ELLIPTA) 100-25 MCG/INH AEPB Inhale 1 puff into the lungs daily. 90 each 1  . folic acid (FOLVITE) 1 MG tablet Take 1 tablet (1 mg total) by mouth daily. 30 tablet 1  . gabapentin (NEURONTIN) 300 MG capsule Take 1 capsule by mouth 2 (two) times daily.    . Melatonin 3 MG TABS Take 3 mg by mouth at bedtime.    . metoprolol succinate (TOPROL-XL) 50 MG 24 hr tablet Take 1 tablet (50 mg total) by mouth daily. Take with or immediately following a meal. 30 tablet 3  . mirtazapine (REMERON) 15 MG tablet Take 1 tablet (15 mg total) by mouth at bedtime. 90 tablet 1  . Theanine 100 MG CAPS  Take 1 tablet by mouth daily.    Marland Kitchen thiamine 100 MG tablet Take 1 tablet (100 mg total) by mouth daily. 30 tablet 0  . Tiotropium Bromide Monohydrate (SPIRIVA RESPIMAT) 2.5 MCG/ACT AERS Inhale 2 puffs into the lungs daily. 12 g 1   No current facility-administered medications for this visit.     Allergies:   Lisinopril and Penicillins    Social History:  The patient  reports that he has been smoking cigarettes. He has a 25.00 pack-year smoking history. He has never used smokeless tobacco. He reports current alcohol use of about 15.0 standard drinks of alcohol per week. He reports that he does not use drugs.   Family History:  The patient's family history includes Alcohol abuse in his maternal uncle; Colon cancer in his father; Dementia in his  mother; Emphysema in his paternal grandmother; Heart failure in his paternal grandmother; Hyperlipidemia in his father; Lung cancer in his maternal uncle; Stomach cancer in his father.  He indicated that his mother is alive. He indicated that his father is deceased. He indicated that his sister is alive. He indicated that his brother is alive. He indicated that the status of his paternal grandmother is unknown. He indicated that the status of his maternal uncle is unknown. He indicated that the status of his neg hx is unknown.   ROS:  Please see the history of present illness. All other systems are reviewed and negative.    PHYSICAL EXAM: VS:  BP (!) 150/85   Pulse 86   Ht 5\' 8"  (1.727 m)   Wt 158 lb (71.7 kg)   SpO2 95%   BMI 24.02 kg/m  , BMI Body mass index is 24.02 kg/m. GEN: Well nourished, well developed, male in no acute distress HEENT: normal for age  Neck: no JVD, no carotid bruit, no masses Cardiac: RRR; soft murmur LLSB, no rubs, or gallops Respiratory:  clear to auscultation bilaterally, normal work of breathing GI: soft, nontender, nondistended, + BS MS: no deformity or atrophy; no edema; distal pulses are 2+ in all 4 extremities  Skin: warm and dry, no rash Neuro:  Strength and sensation are intact Psych: euthymic mood, full affect   EKG:  EKG is ordered today. The ekg ordered today demonstrates SR, HR 82, RBBB is old. No change from 07/07/2018  Cardiac catheterization 07/04/2018:   Widely patent left main.  Segmental 50% LAD narrowing. Large first diagonal with 70% stenosis in the mid to distal vessel.  Large normal ramus intermedius  Circumflex coronary gives origin to 3 tiny obtuse marginal branches. 50-60 % stenosis noted in the mid body of the circumflex.  Dominant right coronary with luminal irregularities.  Apical ballooning identified on left ventriculography consistent with Takotsubo cardiomyopathy  Severe left ventricular systolic dysfunction  with EF less than 25% and LVEDP 24 mmHg  Cardiogenic shock on presentation now improving and patient is off pressor therapy.  RECOMMENDATIONS:   Convert heparin to DVT prophylaxis.  Renin-angiotensin and sympathetic nervous system blockade as tolerated by blood pressure to facilitate LV recovery.  Diuresis as needed to prevent pulmonary congestion.  Alcohol cessation   TTE 07/03/18 Study Conclusions  - Left ventricle: The cavity size was normal. Wall thickness was increased in a pattern of mild LVH. Systolic function was moderately reduced. The estimated ejection fraction was in the range of 35% to 40%. There is akinesis of the mid-apicalanteroseptal, anterior, anterolateral, lateral, inferolateral, and apical myocardium. Doppler parameters are consistent with abnormal left ventricular relaxation (  grade 1 diastolic dysfunction).  Recent Labs: 07/03/2018: TSH 1.334 07/06/2018: B Natriuretic Peptide 1,708.4; Magnesium 1.8 07/22/2018: ALT 29; BUN 12; Creatinine, Ser 0.93; Hemoglobin 14.5; Platelets 374.0; Potassium 4.1; Sodium 139  CBC    Component Value Date/Time   WBC 8.9 07/22/2018 1421   RBC 4.61 07/22/2018 1421   HGB 14.5 07/22/2018 1421   HCT 43.5 07/22/2018 1421   PLT 374.0 07/22/2018 1421   MCV 94.3 07/22/2018 1421   MCH 31.6 07/06/2018 0257   MCHC 33.3 07/22/2018 1421   RDW 13.9 07/22/2018 1421   LYMPHSABS 2.1 07/22/2018 1421   MONOABS 0.7 07/22/2018 1421   EOSABS 0.1 07/22/2018 1421   BASOSABS 0.1 07/22/2018 1421   CMP Latest Ref Rng & Units 07/22/2018 07/09/2018 07/08/2018  Glucose 70 - 99 mg/dL 101(H) 101(H) 105(H)  BUN 6 - 23 mg/dL 12 14 10   Creatinine 0.40 - 1.50 mg/dL 0.93 0.86 0.75  Sodium 135 - 145 mEq/L 139 130(L) 131(L)  Potassium 3.5 - 5.1 mEq/L 4.1 3.5 3.6  Chloride 96 - 112 mEq/L 102 101 100  CO2 19 - 32 mEq/L 33(H) 23 23  Calcium 8.4 - 10.5 mg/dL 9.3 8.1(L) 8.1(L)  Total Protein 6.0 - 8.3 g/dL 7.2 - -  Total Bilirubin 0.2 -  1.2 mg/dL 0.4 - -  Alkaline Phos 39 - 117 U/L 84 - -  AST 0 - 37 U/L 18 - -  ALT 0 - 53 U/L 29 - -     Lipid Panel Lab Results  Component Value Date   CHOL 109 07/05/2018   HDL 50 07/05/2018   LDLCALC 46 07/05/2018   LDLDIRECT 154.1 06/03/2010   TRIG 66 07/05/2018   CHOLHDL 2.2 07/05/2018      Wt Readings from Last 3 Encounters:  11/08/18 158 lb (71.7 kg)  07/22/18 149 lb 4 oz (67.7 kg)  07/19/18 150 lb (68 kg)     Other studies Reviewed: Additional studies/ records that were reviewed today include: office notes, hospital records and testing.  ASSESSMENT AND PLAN:  1.  NICM: No sx overload, no CHF sx. Recheck echo. Only on BB, not on ACE (cough), or ARB. Decide on med changes after echo reviewed.  If his EF is significantly low, amlodipine is not the optimal BP med  2. Chest pain: atypical, not exertional. Right now, he is comfortable getting other medical issues treated and then see if the chest pain is still there. If so, would order POET.   3. HTN: His blood pressure is elevated today, but he has not had his a.m. meds.  He was given a chart to track his blood pressure.  He uses "my chart" and will communicate the readings to [place] to see if any med changes are needed  4. CRF: His activity level is extremely poor.  Gave him a chart to start a walking program.  He will communicate the results to [place].  If his EF has normalized and he successfully begins a walking program, follow-up in 1 year.  If his EF is still decreased, he will need to be restarted on an ARB and brought back in for med titration.   Current medicines are reviewed at length with the patient today.  The patient does not have concerns regarding medicines.  The following changes have been made:  no change  Labs/ tests ordered today include:  No orders of the defined types were placed in this encounter.    Disposition:   FU with Minus Breeding, MD, timing  determined by improvement (1 yr) or lack of (3 weeks  if echo no change).   Signed, Rosaria Ferries, PA-C  11/08/2018 8:20 AM    [facility] Phone: [phone]; Fax: [phone]

## 2018-11-06 DIAGNOSIS — C4491 Basal cell carcinoma of skin, unspecified: Secondary | ICD-10-CM

## 2018-11-06 DIAGNOSIS — D229 Melanocytic nevi, unspecified: Secondary | ICD-10-CM

## 2018-11-06 DIAGNOSIS — L570 Actinic keratosis: Secondary | ICD-10-CM | POA: Diagnosis not present

## 2018-11-06 DIAGNOSIS — C44311 Basal cell carcinoma of skin of nose: Secondary | ICD-10-CM | POA: Diagnosis not present

## 2018-11-06 DIAGNOSIS — D485 Neoplasm of uncertain behavior of skin: Secondary | ICD-10-CM | POA: Diagnosis not present

## 2018-11-06 HISTORY — DX: Melanocytic nevi, unspecified: D22.9

## 2018-11-06 HISTORY — DX: Basal cell carcinoma of skin, unspecified: C44.91

## 2018-11-08 ENCOUNTER — Ambulatory Visit: Payer: BLUE CROSS/BLUE SHIELD | Admitting: Physician Assistant

## 2018-11-08 ENCOUNTER — Encounter: Payer: Self-pay | Admitting: Physician Assistant

## 2018-11-08 VITALS — BP 150/85 | HR 86 | Ht 68.0 in | Wt 158.0 lb

## 2018-11-08 DIAGNOSIS — I428 Other cardiomyopathies: Secondary | ICD-10-CM | POA: Diagnosis not present

## 2018-11-08 DIAGNOSIS — R079 Chest pain, unspecified: Secondary | ICD-10-CM

## 2018-11-08 DIAGNOSIS — I10 Essential (primary) hypertension: Secondary | ICD-10-CM | POA: Diagnosis not present

## 2018-11-08 NOTE — Patient Instructions (Addendum)
Medication Instructions:  NO CHANGES- Your physician recommends that you continue on your current medications as directed. Please refer to the Current Medication list given to you today. If you need a refill on your cardiac medications before your next appointment, please call your pharmacy.  Labwork: NONE ORDERED  When you have your labs (blood work) drawn today and your tests are completely normal, you will receive your results only by MyChart Message (if you have MyChart) -OR-  A paper copy in the mail.  If you have any lab test that is abnormal or we need to change your treatment, we will call you to review these results.  Testing/Procedures: Echocardiogram - Your physician has requested that you have an echocardiogram. Echocardiography is a painless test that uses sound waves to create images of your heart. It provides your doctor with information about the size and shape of your heart and how well your heart's chambers and valves are working. This procedure takes approximately one hour. There are no restrictions for this procedure. This will be performed at our [facility] location - [address].  Special Instructions: START WALKING PROGRAM(LOG STATUS ON LIST)-SEND MYCHART MESSAGE IN 2-3 WEEKS WITH STATUS OF WALING PROGRAM GOAL IS TO WALK 1 HOUR DAILY  TAKE AND LOG YOUR BP DAILY-BRING TO UPCOMING APPTS  PLEASE FOLLOW HEART HEALTHY DIET  Follow-Up: You may see Minus Breeding, MD Rosaria Ferries, PA-C or one of the following Advanced Practice Providers on your designated Care Team:  Rosaria Ferries, PA-C  Jory Sims, DNP, [place], you and your health needs are our priority.  As part of our continuing mission to provide you with exceptional heart care, we have created designated Provider Care Teams.  These Care Teams include your primary Cardiologist (physician) and Advanced Practice Providers (APPs -  Physician Assistants and Nurse Practitioners) who all  work together to provide you with the care you need, when you need it.  Thank you for choosing [facility]!!     HEART HEALTHY DIET Fat and Cholesterol Restricted Eating Plan Getting too much fat and cholesterol in your diet may cause health problems. Choosing the right foods helps keep your fat and cholesterol at normal levels. This can keep you from getting certain diseases. Your doctor may recommend an eating plan that includes:  Total fat: ______% or less of total calories a day.  Saturated fat: ______% or less of total calories a day.  Cholesterol: less than _________mg a day.  Fiber: ______g a day. What are tips for following this plan? Meal planning  At meals, divide your plate into four equal parts: ? Fill one-half of your plate with vegetables and green salads. ? Fill one-fourth of your plate with whole grains. ? Fill one-fourth of your plate with low-fat (lean) protein foods.  Eat fish that is high in omega-3 fats at least two times a week. This includes mackerel, tuna, sardines, and salmon.  Eat foods that are high in fiber, such as whole grains, beans, apples, broccoli, carrots, peas, and barley. General tips   Work with your doctor to lose weight if you need to.  Avoid: ? Foods with added sugar. ? Fried foods. ? Foods with partially hydrogenated oils.  Limit alcohol intake to no more than 1 drink a day for nonpregnant women and 2 drinks a day for men. One drink equals 12 oz of beer, 5 oz of wine, or 1 oz of hard liquor. Reading  food labels  Check food labels for: ? Trans fats. ? Partially hydrogenated oils. ? Saturated fat (g) in each serving. ? Cholesterol (mg) in each serving. ? Fiber (g) in each serving.  Choose foods with healthy fats, such as: ? Monounsaturated fats. ? Polyunsaturated fats. ? Omega-3 fats.  Choose grain products that have whole grains. Look for the word "whole" as the first word in the ingredient  list. Cooking  Cook foods using low-fat methods. These include baking, boiling, grilling, and broiling.  Eat more home-cooked foods. Eat at restaurants and buffets less often.  Avoid cooking using saturated fats, such as butter, cream, palm oil, palm kernel oil, and coconut oil. Recommended foods  Fruits  All fresh, canned (in natural juice), or frozen fruits. Vegetables  Fresh or frozen vegetables (raw, steamed, roasted, or grilled). Green salads. Grains  Whole grains, such as whole wheat or whole grain breads, crackers, cereals, and pasta. Unsweetened oatmeal, bulgur, barley, quinoa, or brown rice. Corn or whole wheat flour tortillas. Meats and other protein foods  Ground beef (85% or leaner), grass-fed beef, or beef trimmed of fat. Skinless chicken or [place]. Ground chicken or [place]. Pork trimmed of fat. All fish and seafood. Egg whites. Dried beans, peas, or lentils. Unsalted nuts or seeds. Unsalted canned beans. Nut butters without added sugar or oil. Dairy  Low-fat or nonfat dairy products, such as skim or 1% milk, 2% or reduced-fat cheeses, low-fat and fat-free ricotta or cottage cheese, or plain low-fat and nonfat yogurt. Fats and oils  Tub margarine without trans fats. Light or reduced-fat mayonnaise and salad dressings. Avocado. Olive, canola, sesame, or safflower oils. The items listed above may not be a complete list of foods and beverages you can eat. Contact a dietitian for more information. Foods to avoid Fruits  Canned fruit in heavy syrup. Fruit in cream or butter sauce. Fried fruit. Vegetables  Vegetables cooked in cheese, cream, or butter sauce. Fried vegetables. Grains  White bread. White pasta. White rice. Cornbread. Bagels, pastries, and croissants. Crackers and snack foods that contain trans fat and hydrogenated oils. Meats and other protein foods  Fatty cuts of meat. Ribs, chicken wings, bacon, sausage, bologna, salami, chitterlings, fatback, hot  dogs, bratwurst, and packaged lunch meats. Liver and organ meats. Whole eggs and egg yolks. Chicken and [place] with skin. Fried meat. Dairy  Whole or 2% milk, cream, half-and-half, and cream cheese. Whole milk cheeses. Whole-fat or sweetened yogurt. Full-fat cheeses. Nondairy creamers and whipped toppings. Processed cheese, cheese spreads, and cheese curds. Beverages  Alcohol. Sugar-sweetened drinks such as sodas, lemonade, and fruit drinks. Fats and oils  Butter, stick margarine, lard, shortening, ghee, or bacon fat. Coconut, palm kernel, and palm oils. Sweets and desserts  Corn syrup, sugars, honey, and molasses. Candy. Jam and jelly. Syrup. Sweetened cereals. Cookies, pies, cakes, donuts, muffins, and ice cream. The items listed above may not be a complete list of foods and beverages you should avoid. Contact a dietitian for more information. Summary  Choosing the right foods helps keep your fat and cholesterol at normal levels. This can keep you from getting certain diseases.  At meals, fill one-half of your plate with vegetables and green salads.  Eat high-fiber foods, like whole grains, beans, apples, carrots, peas, and barley.  Limit added sugar, saturated fats, alcohol, and fried foods. This information is not intended to replace advice given to you by your health care provider. Make sure you discuss any questions you have with your health care provider.  Document Released: 03/26/2012 Document Revised: 05/29/2018 Document Reviewed: 06/12/2017 Elsevier Interactive Patient Education  2019 [employer].

## 2018-11-12 DIAGNOSIS — F429 Obsessive-compulsive disorder, unspecified: Secondary | ICD-10-CM | POA: Diagnosis not present

## 2018-11-13 ENCOUNTER — Other Ambulatory Visit (HOSPITAL_COMMUNITY): Payer: Self-pay

## 2018-11-13 ENCOUNTER — Other Ambulatory Visit: Payer: Self-pay | Admitting: Internal Medicine

## 2018-11-13 MED ORDER — METOPROLOL SUCCINATE ER 50 MG PO TB24: 50.0000 mg | ORAL_TABLET | Freq: Every day | ORAL | 0 refills | Status: DC

## 2018-11-13 NOTE — Telephone Encounter (Signed)
Copied from [place] [id number]. Topic: Quick Communication - Rx Refill/Question >> Nov 13, 2018  4:19 PM Ahmed Prima L wrote: Medication: metoprolol succinate (TOPROL-XL) 50 MG 24 hr tablet  Has the patient contacted their pharmacy? Yes (Agent: If no, request that the patient contact the pharmacy for the refill.) (Agent: If yes, when and what did the pharmacy advise?)  Preferred Pharmacy (with phone number or street name): [id number] - [place], [place] - [address] AT [address] [facility] [place] [address]    Agent: Please be advised that RX refills may take up to 3 business days. We ask that you follow-up with your pharmacy.

## 2018-12-10 DIAGNOSIS — F429 Obsessive-compulsive disorder, unspecified: Secondary | ICD-10-CM | POA: Diagnosis not present

## 2019-01-13 ENCOUNTER — Ambulatory Visit: Payer: Self-pay | Admitting: *Deleted

## 2019-01-13 NOTE — Telephone Encounter (Signed)
Summary: loss of appetite    Pt called and stated that he has lost his appetite and would like a call back from the nurse regarding.      Patient states he is having a problem with his appetite. Patient has not eaten since last night. Patient normally only eats once normally. Patient usually will microwave a healthy choice.  Encourage to hydrate and use power aide or low sodium sports drink- if can. Patient has been drinking beer or wine- he does not drink liquor. Patient restarted drinking mid- March. Patient encouraged to use his support system during this stressful time. Patient is very concerned about his health conditions and this virus. Discussed high risk symptoms and he does not exihibit them. ( patient has cardiomyopathy and COPD,smoker- he does not report changes in these symptoms) Offered to make patient appointment- but he refuses at this time- he states he will call back if needed- I told him I would send this note and his provider may reach out to him. He is aware.  Reason for Disposition . Nursing judgment or information in reference  Answer Assessment - Initial Assessment Questions 1. REASON FOR CALL: "What is your main concern right now?"     Patient is concerned about all of his medical conditions- he has started drinking again. Patient is afraid he is not going to be able to eat 2. ONSET: "When did the  start?"     Today- patient states he only eats one meal /day 3. SEVERITY: "How bad is the appetite?"     Patient only eats once daily- he states he does not if he can eat- he is drinking water 4. FEVER: "Do you have a fever?"     No fever 5. OTHER SYMPTOMS: "Do you have any other new symptoms?"     No new symptoms- although he is drinking alcohol again 6. INTERVENTIONS AND RESPONSE: "What have you done so far to try to make this better? What medications have you used?"     Suggested trying to replace electrolytes- possibly asking provider about supplemental drinks 7.  PREGNANCY: "Is there any chance you are pregnant?"     n/a  Protocols used: NO GUIDELINE AVAILABLE-A-AH

## 2019-01-15 NOTE — Telephone Encounter (Signed)
LVM for pt to call back as soon as possible.   

## 2019-01-20 NOTE — Telephone Encounter (Signed)
Contacted pt and he stated that he has been able to eat but has not been able to sleep and when he does he is not able to stay asleep. Pt was due for follow up and has been scheduled for a virtual visit.    Pt is requesting a refill of metoprolol.

## 2019-01-20 NOTE — Addendum Note (Signed)
Addended by: Aviva Signs M on: 01/20/2019 11:20 AM   Modules accepted: Orders

## 2019-01-21 ENCOUNTER — Ambulatory Visit (INDEPENDENT_AMBULATORY_CARE_PROVIDER_SITE_OTHER): Payer: BLUE CROSS/BLUE SHIELD | Admitting: Internal Medicine

## 2019-01-21 ENCOUNTER — Encounter: Payer: Self-pay | Admitting: Internal Medicine

## 2019-01-21 DIAGNOSIS — J449 Chronic obstructive pulmonary disease, unspecified: Secondary | ICD-10-CM | POA: Diagnosis not present

## 2019-01-21 DIAGNOSIS — I2510 Atherosclerotic heart disease of native coronary artery without angina pectoris: Secondary | ICD-10-CM

## 2019-01-21 DIAGNOSIS — I10 Essential (primary) hypertension: Secondary | ICD-10-CM | POA: Diagnosis not present

## 2019-01-21 MED ORDER — TIOTROPIUM BROMIDE MONOHYDRATE 2.5 MCG/ACT IN AERS: 2.0000 | INHALATION_SPRAY | Freq: Every day | RESPIRATORY_TRACT | 1 refills | Status: DC

## 2019-01-21 MED ORDER — METOPROLOL SUCCINATE ER 50 MG PO TB24: 50.0000 mg | ORAL_TABLET | Freq: Every day | ORAL | 0 refills | Status: DC

## 2019-01-21 MED ORDER — FLUTICASONE FUROATE-VILANTEROL 100-25 MCG/INH IN AEPB: 1.0000 | INHALATION_SPRAY | Freq: Every day | RESPIRATORY_TRACT | 1 refills | Status: DC

## 2019-01-21 NOTE — Progress Notes (Signed)
Virtual Visit via Video Note  I connected with Oscar Phillips on 01/21/19 at  3:30 PM EDT by a video enabled telemedicine application and verified that I am speaking with the correct person using two identifiers.   I discussed the limitations of evaluation and management by telemedicine and the availability of in person appointments. The patient expressed understanding and agreed to proceed.  History of Present Illness: He checked in for virtual visit.  He was not willing to come in because of such concerns over the COVID-19 pandemic.  Unfortunately, he says he relapsed and has been drinking alcohol for the past few weeks.  It sounds like he is drinking about a 12 pack of beer per day.  He complains of anxiety and insomnia.  When he tries to stop drinking he goes into withdrawal and feels anxious and shaky.  In the last year he has done a couple of rehabs but none of them were successful.  He does not want to go back to rehab or treat the alcoholism at this time.  He has some psych meds that are being prescribed by a psychiatrist.  He requests a refill on the beta-blocker.  He also complains of recurrent cough this productive of clear phlegm.  He needs refills on his inhalers.  He has mild shortness of breath and wheezing but denies chest pain, fever, chills, hemoptysis, or purulent sputum.  He continues to smoke cigarettes.  He says he cannot deal with the stress in the world without drinking and smoking.    Observations/Objective: On the video feed he was anxious but in no respiratory distress.  He was alert and oriented.  His speech was normal in fluid.  There was some evidence of memory loss.  There was no evidence of fluid overload.   Assessment and Plan: I encouraged him to try to stop drinking and to let me know if he wants a referral for another detox and rehab.  He was not willing to pursue that at this time.  I have asked him to be more compliant with his inhalers including the LABA/LAMA/ICS.   I sent in refills for these.  I also sent a refill for the metoprolol.   Follow Up Instructions: He will let me know if he has any new or worsening symptoms.  If his symptoms persist then he will go to the emergency department.    I discussed the assessment and treatment plan with the patient. The patient was provided an opportunity to ask questions and all were answered. The patient agreed with the plan and demonstrated an understanding of the instructions.   The patient was advised to call back or seek an in-person evaluation if the symptoms worsen or if the condition fails to improve as anticipated.  I provided 25 minutes of non-face-to-face time during this encounter.   Scarlette Calico, MD

## 2019-02-06 ENCOUNTER — Emergency Department (HOSPITAL_COMMUNITY): Payer: BLUE CROSS/BLUE SHIELD

## 2019-02-06 ENCOUNTER — Other Ambulatory Visit: Payer: Self-pay

## 2019-02-06 ENCOUNTER — Encounter (HOSPITAL_COMMUNITY): Payer: Self-pay | Admitting: Emergency Medicine

## 2019-02-06 ENCOUNTER — Emergency Department (HOSPITAL_COMMUNITY)
Admission: EM | Admit: 2019-02-06 | Discharge: 2019-02-06 | Disposition: A | Discharge status: Home / Self Care | Payer: BLUE CROSS/BLUE SHIELD | Attending: Emergency Medicine | Admitting: Emergency Medicine

## 2019-02-06 DIAGNOSIS — Y906 Blood alcohol level of 120-199 mg/100 ml: Secondary | ICD-10-CM | POA: Insufficient documentation

## 2019-02-06 DIAGNOSIS — I10 Essential (primary) hypertension: Secondary | ICD-10-CM | POA: Insufficient documentation

## 2019-02-06 DIAGNOSIS — Z79899 Other long term (current) drug therapy: Secondary | ICD-10-CM | POA: Diagnosis not present

## 2019-02-06 DIAGNOSIS — R5383 Other fatigue: Secondary | ICD-10-CM | POA: Diagnosis not present

## 2019-02-06 DIAGNOSIS — I2510 Atherosclerotic heart disease of native coronary artery without angina pectoris: Secondary | ICD-10-CM | POA: Insufficient documentation

## 2019-02-06 DIAGNOSIS — F1010 Alcohol abuse, uncomplicated: Secondary | ICD-10-CM | POA: Diagnosis not present

## 2019-02-06 DIAGNOSIS — F17210 Nicotine dependence, cigarettes, uncomplicated: Secondary | ICD-10-CM | POA: Diagnosis not present

## 2019-02-06 DIAGNOSIS — J449 Chronic obstructive pulmonary disease, unspecified: Secondary | ICD-10-CM | POA: Diagnosis not present

## 2019-02-06 DIAGNOSIS — J439 Emphysema, unspecified: Secondary | ICD-10-CM | POA: Diagnosis not present

## 2019-02-06 LAB — TSH: TSH: 0.422 u[IU]/mL (ref 0.350–4.500)

## 2019-02-06 LAB — CBC WITH DIFFERENTIAL/PLATELET
Abs Immature Granulocytes: 0.03 10*3/uL (ref 0.00–0.07)
Basophils Absolute: 0 10*3/uL (ref 0.0–0.1)
Basophils Relative: 1 %
Eosinophils Absolute: 0 10*3/uL (ref 0.0–0.5)
Eosinophils Relative: 1 %
HCT: 54.9 % — ABNORMAL HIGH (ref 39.0–52.0)
Hemoglobin: 18.3 g/dL — ABNORMAL HIGH (ref 13.0–17.0)
Immature Granulocytes: 0 %
Lymphocytes Relative: 24 %
Lymphs Abs: 2 10*3/uL (ref 0.7–4.0)
MCH: 31.1 pg (ref 26.0–34.0)
MCHC: 33.3 g/dL (ref 30.0–36.0)
MCV: 93.2 fL (ref 80.0–100.0)
Monocytes Absolute: 0.4 10*3/uL (ref 0.1–1.0)
Monocytes Relative: 5 %
Neutro Abs: 5.7 10*3/uL (ref 1.7–7.7)
Neutrophils Relative %: 69 %
Platelets: 270 10*3/uL (ref 150–400)
RBC: 5.89 MIL/uL — ABNORMAL HIGH (ref 4.22–5.81)
RDW: 15.1 % (ref 11.5–15.5)
WBC: 8.3 10*3/uL (ref 4.0–10.5)
nRBC: 0 % (ref 0.0–0.2)

## 2019-02-06 LAB — ETHANOL: Alcohol, Ethyl (B): 169 mg/dL — ABNORMAL HIGH (ref <10)

## 2019-02-06 LAB — COMPREHENSIVE METABOLIC PANEL
ALT: 113 U/L — ABNORMAL HIGH (ref 0–44)
AST: 94 U/L — ABNORMAL HIGH (ref 15–41)
Albumin: 4.6 g/dL (ref 3.5–5.0)
Alkaline Phosphatase: 79 U/L (ref 38–126)
Anion gap: 12 (ref 5–15)
BUN: <5 mg/dL — ABNORMAL LOW (ref 8–23)
CO2: 25 mmol/L (ref 22–32)
Calcium: 9.1 mg/dL (ref 8.9–10.3)
Chloride: 96 mmol/L — ABNORMAL LOW (ref 98–111)
Creatinine, Ser: 0.66 mg/dL (ref 0.61–1.24)
GFR calc Af Amer: >60 mL/min (ref >60)
GFR calc non Af Amer: >60 mL/min (ref >60)
Glucose, Bld: 80 mg/dL (ref 70–99)
Potassium: 4 mmol/L (ref 3.5–5.1)
Sodium: 133 mmol/L — ABNORMAL LOW (ref 135–145)
Total Bilirubin: 0.7 mg/dL (ref 0.3–1.2)
Total Protein: 8.1 g/dL (ref 6.5–8.1)

## 2019-02-06 LAB — T4, FREE: Free T4: 0.88 ng/dL (ref 0.82–1.77)

## 2019-02-06 MED ORDER — CHLORDIAZEPOXIDE HCL 25 MG PO CAPS
100.0000 mg | ORAL_CAPSULE | Freq: Once | ORAL | Status: AC
  Administered 2019-02-06: 100 mg via ORAL
  Filled 2019-02-06: qty 4

## 2019-02-06 MED ORDER — VITAMIN B-1 100 MG PO TABS
100.0000 mg | ORAL_TABLET | Freq: Once | ORAL | Status: AC
  Administered 2019-02-06: 100 mg via ORAL
  Filled 2019-02-06: qty 1

## 2019-02-06 MED ORDER — CHLORDIAZEPOXIDE HCL 25 MG PO CAPS: ORAL_CAPSULE | ORAL | 0 refills | Status: DC

## 2019-02-06 MED ORDER — NICOTINE 21 MG/24HR TD PT24
21.0000 mg | MEDICATED_PATCH | Freq: Every day | TRANSDERMAL | Status: DC
  Administered 2019-02-06: 18:00:00 21 mg via TRANSDERMAL
  Filled 2019-02-06: qty 1
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
## 2019-02-06 NOTE — ED Triage Notes (Signed)
Pt reports been very fatigued for the past month, his brother and sister feels patient needs to come and stay the night at the hospital. Reports is an alcoholic that drinks on average 12 pack beer everyday, had at least 4 beers today. States he traveled to an from [place] from October to March of this year, denies cough, congestion, SOB, vomiting, states that loose stools is normal for his ETOH use.

## 2019-02-06 NOTE — ED Provider Notes (Addendum)
[facility] DEPT Provider Note   CSN: [id number] Arrival date & time: 02/06/19  1609    History   Chief Complaint Chief Complaint  Patient presents with   Fatigue    HPI Oscar Phillips is a 63 y.o. male.     63 yo M with a chief complaint of feeling fatigued.  He said this been going on for many weeks.  He has trouble fully describing his symptoms but says it feels a little bit like when you overexert yourself but worse.  He states that he has been having trouble with alcoholism had quit at some point but unfortunately has built up his tolerance to where now he is drinking about a 12 pack a day.  Prefers beer.  He denies any infectious symptoms denies cough congestion or fever denies vomiting or diarrhea denies abdominal pain.  Has had decreased oral intake over the past couple weeks.  Says he just does not feel like eating.  Has a prior history of alcohol induced cardiomyopathy.  Was told to stop drinking to prevent this.  He denies any lower extremity edema.  He denies exposure to the novel coronavirus.  The history is provided by the patient.  Illness  Severity:  Moderate Onset quality:  Gradual Duration:  2 months Timing:  Constant Progression:  Worsening Chronicity:  Recurrent Associated symptoms: fatigue   Associated symptoms: no abdominal pain, no chest pain, no congestion, no diarrhea, no fever, no headaches, no myalgias, no rash, no shortness of breath and no vomiting     Past Medical History:  Diagnosis Date   Abdominal bruit    Alcoholism ([place])    Anxiety    B12 deficiency    Bronchitis    COPD (chronic obstructive pulmonary disease) (H[facility])    GERD (gastroesophageal reflux disease)    H/O trichomonal urethritis    History of panic attacks    History of rectal bleeding    in August 2016   HLD (hyperlipidemia)    HTN (hypertension)    OCD (obsessive compulsive disorder)    PSA elevation     Patient Active  Problem List   Diagnosis Date Noted   Coronary artery disease involving native coronary artery of native heart without angina pectoris 07/19/2018   Esophageal dysphagia 04/17/2018   PAD (peripheral artery disease) ([place]) 04/17/2018   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 ([place]) 04/17/2018   GAD (generalized anxiety disorder)    MDD (major depressive disorder), recurrent episode, severe ([place]) 04/01/2018   Chronic alcohol abuse 03/02/2018   Colon cancer screening 05/24/2016   PSA elevation 08/23/2015   Routine general medical examination at a health care facility 05/20/2015   Essential hypertension 05/20/2015   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 exacerbation ([place]) 05/20/2015   Hyperlipidemia with target LDL less than 100 05/20/2015   B12 deficiency 09/21/2010   Irritable bowel syndrome 09/20/2010   TOBACCO USE 06/03/2010   GERD 06/03/2010    Past Surgical History:  Procedure Laterality Date   ELBOW FRACTURE SURGERY Left    LEFT HEART CATH AND CORONARY ANGIOGRAPHY N/A 07/04/2018   Procedure: LEFT HEART CATH AND CORONARY ANGIOGRAPHY;  Surgeon: Belva Crome, MD;  Location: [place] [facility];  Service: Cardiovascular;  Laterality: N/A;   [place] Medications    Prior to Admission medications   Medication Sig Start Date End Date Taking? Authorizing Provider  chlordiazePOXIDE (LIBRIUM)  25 MG capsule 50mg  PO TID x 1D, then 25-50mg  PO BID X 1D, then 25-50mg  PO QD X 1D 02/06/19   Deno Etienne, DO  fluticasone furoate-vilanterol (BREO ELLIPTA) 100-25 MCG/INH AEPB Inhale 1 puff into the lungs daily. 01/21/19   Janith Lima, MD  folic acid (FOLVITE) 1 MG tablet Take 1 tablet (1 mg total) by mouth daily. 07/09/18   Rai, Ripudeep Raliegh Ip, MD  gabapentin (NEURONTIN) 300 MG capsule Take 1 capsule by mouth 2 (two) times daily. 10/23/18   [provider]  Melatonin 3 MG TABS Take 3 mg by mouth at  bedtime.    [provider]  metoprolol succinate (TOPROL-XL) 50 MG 24 hr tablet Take 1 tablet (50 mg total) by mouth daily. Take with or immediately following a meal. 01/21/19   Janith Lima, MD  mirtazapine (REMERON) 15 MG tablet Take 1 tablet (15 mg total) by mouth at bedtime. 07/23/18   Janith Lima, MD  Theanine 100 MG CAPS Take 1 tablet by mouth daily.    [provider]  Tiotropium Bromide Monohydrate (SPIRIVA RESPIMAT) 2.5 MCG/ACT AERS Inhale 2 puffs into the lungs daily. 01/21/19   Janith Lima, MD    Family History Family History  Problem Relation Age of Onset   Hyperlipidemia Father    Colon cancer Father    Stomach cancer Father    Dementia Mother    Alcohol abuse Maternal Uncle    Lung cancer Maternal Uncle    Heart failure Paternal Grandmother    Emphysema Paternal Grandmother    Diabetes Neg Hx    Drug abuse Neg Hx    Early death Neg Hx    Heart disease Neg Hx    Hypertension Neg Hx    Kidney disease Neg Hx    Stroke Neg Hx     Social History Social History   Tobacco Use   Smoking status: Current Every Day Smoker    Packs/day: 1.00    Years: 25.00    Pack years: 25.00    Types: Cigarettes   Smokeless tobacco: Never Used  Substance Use Topics   Alcohol use: Yes    Alcohol/week: 15.0 standard drinks    Types: 15 Glasses of wine per week   Drug use: No     Allergies   Lisinopril and Penicillins   Review of Systems Review of Systems  Constitutional: Positive for fatigue. Negative for chills and fever.  HENT: Negative for congestion and facial swelling.   Eyes: Negative for discharge and visual disturbance.  Respiratory: Negative for shortness of breath.   Cardiovascular: Negative for chest pain and palpitations.  Gastrointestinal: Negative for abdominal pain, diarrhea and vomiting.  Musculoskeletal: Negative for arthralgias and myalgias.  Skin: Negative for color change and rash.  Neurological: Negative  for tremors, syncope and headaches.  Psychiatric/Behavioral: Negative for confusion and dysphoric mood.     Physical Exam Updated Vital Signs BP (!) 156/93    Pulse 76    Temp 98.5 F (36.9 C) (Oral)    Resp 15    SpO2 98%   Physical Exam Vitals signs and nursing note reviewed.  Constitutional:      Appearance: He is well-developed.  HENT:     Head: Normocephalic and atraumatic.  Eyes:     Pupils: Pupils are equal, round, and reactive to light.  Neck:     Musculoskeletal: Normal range of motion and neck supple.     Vascular: No JVD.  Cardiovascular:  Rate and Rhythm: Normal rate and regular rhythm.     Heart sounds: No murmur. No friction rub. No gallop.   Pulmonary:     Effort: No respiratory distress.     Breath sounds: No wheezing.  Abdominal:     General: There is no distension.     Tenderness: There is no guarding or rebound.  Musculoskeletal: Normal range of motion.  Skin:    Coloration: Skin is not pale.     Findings: No rash.  Neurological:     Mental Status: He is alert and oriented to person, place, and time.  Psychiatric:        Behavior: Behavior normal.      ED Treatments / Results  Labs (all labs ordered are listed, but only abnormal results are displayed) Labs Reviewed  CBC WITH DIFFERENTIAL/PLATELET - Abnormal; Notable for the following components:      Result Value   RBC 5.89 (*)    Hemoglobin 18.3 (*)    HCT 54.9 (*)    All other components within normal limits  COMPREHENSIVE METABOLIC PANEL - Abnormal; Notable for the following components:   Sodium 133 (*)    Chloride 96 (*)    BUN <5 (*)    AST 94 (*)    ALT 113 (*)    All other components within normal limits  ETHANOL - Abnormal; Notable for the following components:   Alcohol, Ethyl (B) 169 (*)    All other components within normal limits  TSH  T4, FREE    EKG EKG Interpretation  Date/Time:  Thursday February 06 2019 18:45:42 EDT Ventricular Rate:  88 PR Interval:    QRS  Duration: 134 QT Interval:  388 QTC Calculation: 470 R Axis:   -99 Text Interpretation:  Sinus rhythm Consider right atrial enlargement RBBB and LAFB ST elevation suggests acute pericarditis No significant change since last tracing Confirmed by Deno Etienne [phone]) on 02/06/2019 6:47:30 PM   Radiology Dg Chest 2 View  Result Date: 02/06/2019 CLINICAL DATA:  63 year old male with fatigue EXAM: CHEST - 2 VIEW COMPARISON:  07/06/2018 FINDINGS: Cardiomediastinal silhouette unchanged. Compared to the prior plain film there is improved aeration with resolution of the left-sided findings. Coarsened interstitial markings persist. Stigmata of emphysema, with increased retrosternal airspace, flattened hemidiaphragms, increased AP diameter, and hyperinflation on the AP view. No confluent airspace disease, pneumothorax, or pleural effusion. No displaced fracture. IMPRESSION: Emphysema without evidence of acute cardiopulmonary disease Electronically Signed   By: Corrie Mckusick D.O.   On: 02/06/2019 18:34    Procedures Procedures (including critical care time)  Medications Ordered in ED Medications  nicotine (NICODERM CQ - dosed in mg/24 hours) patch 21 mg (21 mg Transdermal Patch Applied 02/06/19 1827)  thiamine (VITAMIN B-1) tablet 100 mg (100 mg Oral Given 02/06/19 1827)  chlordiazePOXIDE (LIBRIUM) capsule 100 mg (100 mg Oral Given 02/06/19 1827)     Initial Impression / Assessment and Plan / ED Course  I have reviewed the triage vital signs and the nursing notes.  Pertinent labs & imaging results that were available during my care of the patient were reviewed by me and considered in my medical decision making (see chart for details).        63 yo M with a chief complaint of generalized fatigue.  As the patient is a primary beer drinker high we will evaluate his electrolytes for hyponatremia, CBC to check for anemia.  With his history of cardiomyopathy will obtain a chest x-ray to  evaluate for height  size though clinically the patient has no signs of heart failure.  I discussed with him that typically we do not place people into rehab from this facility though if the patient is willing I will contact TTS to have them evaluate.   Labs have returned, no significant electrolyte abnormality or anemia.  TSH normal.  CXR with COPD.  Medically clear.  TTS feels the patient is safe to seek outpatient rehab on his own.  8:42 PM:  I have discussed the diagnosis/risks/treatment options with the patient and believe the pt to be eligible for discharge home to follow-up with PCP, rehab. We also discussed returning to the ED immediately if new or worsening sx occur. We discussed the sx which are most concerning (e.g., sudden worsening pain, fever, inability to tolerate by mouth) that necessitate immediate return. Medications administered to the patient during their visit and any new prescriptions provided to the patient are listed below.  Medications given during this visit Medications  nicotine (NICODERM CQ - dosed in mg/24 hours) patch 21 mg (21 mg Transdermal Patch Applied 02/06/19 1827)  thiamine (VITAMIN B-1) tablet 100 mg (100 mg Oral Given 02/06/19 1827)  chlordiazePOXIDE (LIBRIUM) capsule 100 mg (100 mg Oral Given 02/06/19 1827)     The patient appears reasonably screen and/or stabilized for discharge and I doubt any other medical condition or other [facility] requiring further screening, evaluation, or treatment in the ED at this time prior to discharge.    Final Clinical Impressions(s) / ED Diagnoses   Final diagnoses:  Fatigue, unspecified type  Alcohol abuse    ED Discharge Orders         Ordered    chlordiazePOXIDE (LIBRIUM) 25 MG capsule     02/06/19 2041           Deno Etienne, DO 02/06/19 2042    Deno Etienne, DO 02/18/19 0710

## 2019-02-06 NOTE — ED Notes (Addendum)
Pt eating sandwich and drinking water without difficulty.

## 2019-02-06 NOTE — ED Notes (Signed)
Bed: WA08 Expected date:  Expected time:  Means of arrival:  Comments: 

## 2019-02-06 NOTE — ED Notes (Signed)
Pt in bathroom

## 2019-02-06 NOTE — ED Notes (Signed)
Pt tolerated cup of water.

## 2019-02-06 NOTE — ED Notes (Signed)
TTS machine at bedside. 

## 2019-02-06 NOTE — ED Notes (Signed)
Pt has been cleared by TTS and is sending over resources on drug and alcohol abuse via fax.

## 2019-02-11 ENCOUNTER — Telehealth: Payer: Self-pay | Admitting: Internal Medicine

## 2019-02-11 ENCOUNTER — Ambulatory Visit (INDEPENDENT_AMBULATORY_CARE_PROVIDER_SITE_OTHER): Payer: BLUE CROSS/BLUE SHIELD | Admitting: Internal Medicine

## 2019-02-11 DIAGNOSIS — F10231 Alcohol dependence with withdrawal delirium: Secondary | ICD-10-CM

## 2019-02-11 DIAGNOSIS — J449 Chronic obstructive pulmonary disease, unspecified: Secondary | ICD-10-CM | POA: Diagnosis not present

## 2019-02-11 DIAGNOSIS — F10931 Alcohol use, unspecified with withdrawal delirium: Secondary | ICD-10-CM

## 2019-02-11 MED ORDER — CHLORDIAZEPOXIDE HCL 25 MG PO CAPS: 25.0000 mg | ORAL_CAPSULE | Freq: Three times a day (TID) | ORAL | 0 refills | Status: DC | PRN

## 2019-02-11 MED ORDER — FLUTICASONE-UMECLIDIN-VILANT 100-62.5-25 MCG/INH IN AEPB: 1.0000 | INHALATION_SPRAY | Freq: Every day | RESPIRATORY_TRACT | 1 refills | Status: DC

## 2019-02-11 NOTE — Progress Notes (Signed)
Virtual Visit via Video Note  I connected with Oscar Phillips on 02/11/19 at  2:30 PM EDT by a video enabled telemedicine application and verified that I am speaking with the correct person using two identifiers.   I discussed the limitations of evaluation and management by telemedicine and the availability of in person appointments. The patient expressed understanding and agreed to proceed.  History of Present Illness: He checked in for a virtual visit.  He was recently seen in the ED for complaints of fatigue and was found to have alcoholic hepatitis.  He has since moved to [place] to live with his siblings.  He has decided to go to the [facility] clinic at [place] in [facility] for intense treatment of his alcoholism. He leaves in 3 days.  He tells me his last drink of alcohol was 5 days ago.  In the ED he was given a prescription of Librium to help him detox.  He complains that he is about to run out of that and feels like he is still detoxing with anxiety, panic, and insomnia.  He is taking Gabapentin, BuSpar, and Remeron but he says those medications are not helping.  He denies chest pain, shortness of breath, hallucinations, delusions, suicidality, or homicidality.  He also complains that his current combination of inhalers are too expensive and he wants to try a less expensive inhaler.  He is currently using a combination of a LABA/LAMA/ICS.  He has had a mild productive cough recently but he denies wheezing or shortness of breath.    Observations/Objective: He appeared older than his stated age.  He was anxious.  His speech was normal.  He was calm, cooperative, and appropriate.  He was not hallucinating.   Lab Results  Component Value Date   WBC 8.3 02/06/2019   HGB 18.3 (H) 02/06/2019   HCT 54.9 (H) 02/06/2019   PLT 270 02/06/2019   GLUCOSE 80 02/06/2019   CHOL 109 07/05/2018   TRIG 66 07/05/2018   HDL 50 07/05/2018   LDLDIRECT 154.1 06/03/2010   LDLCALC 46 07/05/2018    ALT 113 (H) 02/06/2019   AST 94 (H) 02/06/2019   NA 133 (L) 02/06/2019   K 4.0 02/06/2019   CL 96 (L) 02/06/2019   CREATININE 0.66 02/06/2019   BUN <5 (L) 02/06/2019   CO2 25 02/06/2019   TSH 0.422 02/06/2019   PSA 5.2 07/22/2018   INR 0.86 07/03/2018   HGBA1C 5.5 03/02/2018     Assessment and Plan: I ordered a refill of the Librium and sent it to a pharmacy in [place].  He will continue to take this as needed until he is admitted into rehab. I also consolidated his triple inhaler into 1 inhaler that is approved by his insurance.  He agrees to continue to abstain from alcohol intake.     Follow Up Instructions: He and his siblings will let me know if he develops any new or worsening symptoms.  He agrees to abstain from any further alcohol intake.  He agrees to take the Librium as needed.  He agrees to follow-up with his plan to be admitted into rehab later this week.    I discussed the assessment and treatment plan with the patient. The patient was provided an opportunity to ask questions and all were answered. The patient agreed with the plan and demonstrated an understanding of the instructions.   The patient was advised to call back or seek an in-person evaluation if the symptoms worsen or  if the condition fails to improve as anticipated.  I provided 30 minutes of non-face-to-face time during this encounter.   Scarlette Calico, MD

## 2019-02-11 NOTE — Telephone Encounter (Signed)
Copied from [place] [id number]. Topic: General - Other >> Feb 11, 2019 10:44 AM Lennox Solders wrote: Reason for CRM: pt is calling and he can not afford the breo. Pt would like generic medication similar to breo that dr Ronnald Ramp recommends. Pt pharm mention generic advair

## 2019-02-12 ENCOUNTER — Encounter: Payer: Self-pay | Admitting: Internal Medicine

## 2019-02-14 IMAGING — DX DG CHEST 2V
2 series · 2 of 2 positions shown · non-contrast
Comparison: Chest radiograph 03/02/2018

CLINICAL DATA: Cough, history of COPD.

EXAM:
CHEST - 2 VIEW

[chest pa]
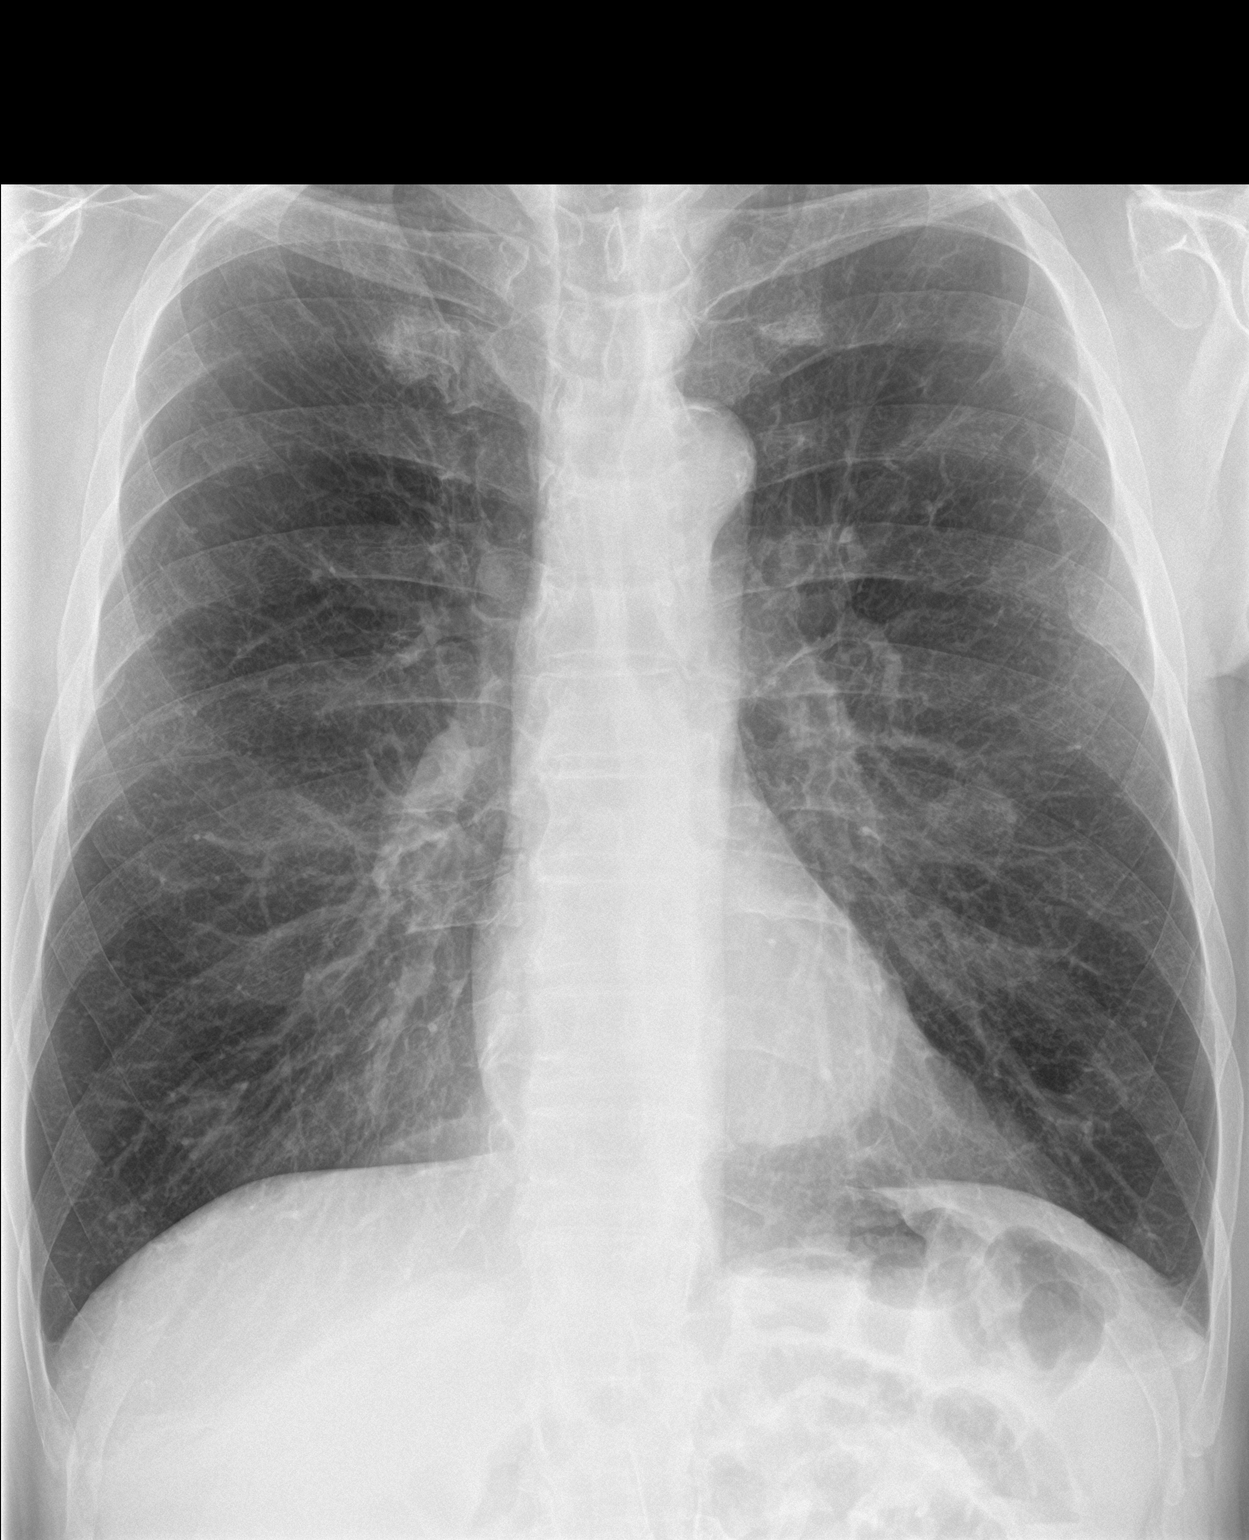

[chest lat]
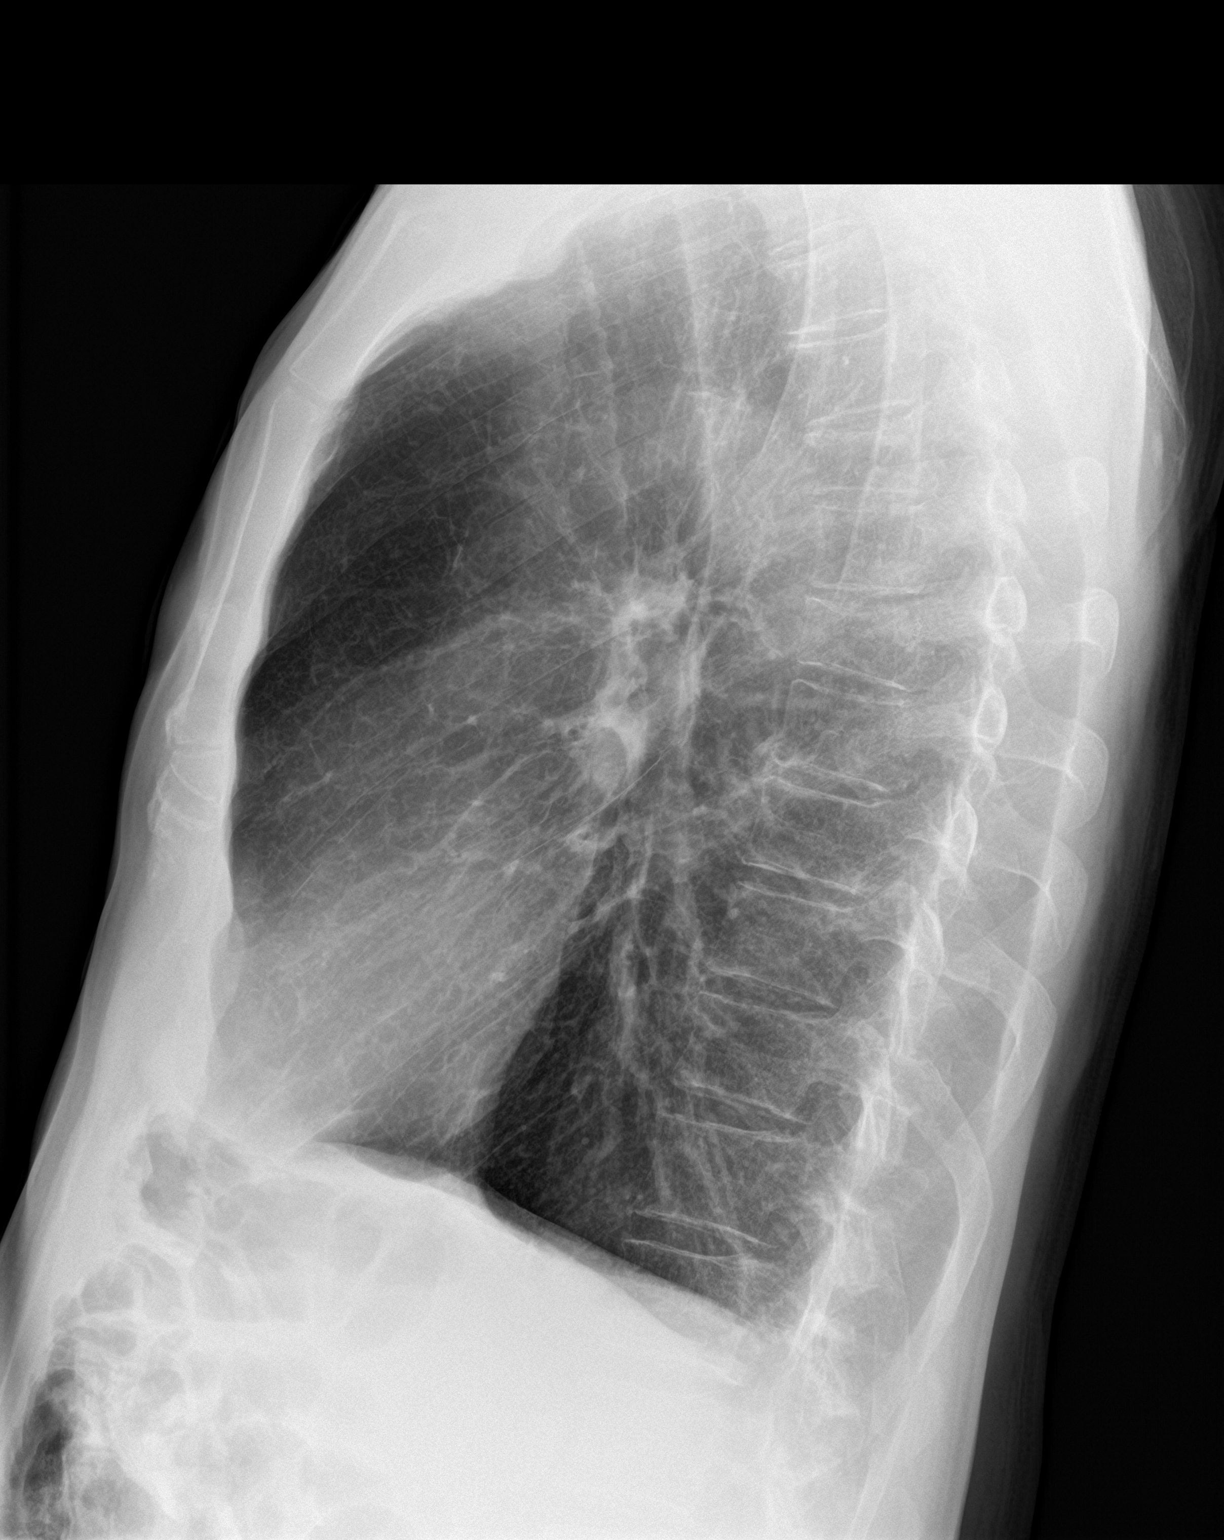

[2 of 2 positions shown; findings below may reference images not displayed]

FINDINGS: Stable cardiac and mediastinal contours. No consolidative pulmonary
opacities. No pleural effusion or pneumothorax. Regional skeleton is
unremarkable.
IMPRESSION: No acute cardiopulmonary process.

## 2019-02-16 DIAGNOSIS — Z1159 Encounter for screening for other viral diseases: Secondary | ICD-10-CM | POA: Diagnosis not present

## 2019-04-14 ENCOUNTER — Other Ambulatory Visit: Payer: Self-pay

## 2019-04-14 ENCOUNTER — Other Ambulatory Visit (INDEPENDENT_AMBULATORY_CARE_PROVIDER_SITE_OTHER): Payer: BC Managed Care – PPO

## 2019-04-14 ENCOUNTER — Encounter: Payer: Self-pay | Admitting: Internal Medicine

## 2019-04-14 ENCOUNTER — Ambulatory Visit (INDEPENDENT_AMBULATORY_CARE_PROVIDER_SITE_OTHER): Payer: BC Managed Care – PPO | Admitting: Internal Medicine

## 2019-04-14 VITALS — BP 158/70 | HR 86 | Temp 98.5°F | Ht 68.0 in | Wt 151.8 lb

## 2019-04-14 DIAGNOSIS — F1010 Alcohol abuse, uncomplicated: Secondary | ICD-10-CM

## 2019-04-14 DIAGNOSIS — E538 Deficiency of other specified B group vitamins: Secondary | ICD-10-CM

## 2019-04-14 DIAGNOSIS — E785 Hyperlipidemia, unspecified: Secondary | ICD-10-CM | POA: Diagnosis not present

## 2019-04-14 DIAGNOSIS — R972 Elevated prostate specific antigen [PSA]: Secondary | ICD-10-CM

## 2019-04-14 DIAGNOSIS — I10 Essential (primary) hypertension: Secondary | ICD-10-CM

## 2019-04-14 DIAGNOSIS — F1096 Alcohol use, unspecified with alcohol-induced persisting amnestic disorder: Secondary | ICD-10-CM

## 2019-04-14 DIAGNOSIS — I2510 Atherosclerotic heart disease of native coronary artery without angina pectoris: Secondary | ICD-10-CM

## 2019-04-14 DIAGNOSIS — M65332 Trigger finger, left middle finger: Secondary | ICD-10-CM

## 2019-04-14 DIAGNOSIS — J449 Chronic obstructive pulmonary disease, unspecified: Secondary | ICD-10-CM | POA: Diagnosis not present

## 2019-04-14 DIAGNOSIS — I739 Peripheral vascular disease, unspecified: Secondary | ICD-10-CM

## 2019-04-14 LAB — VITAMIN B12: Vitamin B-12: 271 pg/mL (ref 211–911)

## 2019-04-14 LAB — CBC WITH DIFFERENTIAL/PLATELET
Basophils Absolute: 0.1 10*3/uL (ref 0.0–0.1)
Basophils Relative: 0.5 % (ref 0.0–3.0)
Eosinophils Absolute: 0.1 10*3/uL (ref 0.0–0.7)
Eosinophils Relative: 1.2 % (ref 0.0–5.0)
HCT: 45.1 % (ref 39.0–52.0)
Hemoglobin: 15.3 g/dL (ref 13.0–17.0)
Lymphocytes Relative: 26 % (ref 12.0–46.0)
Lymphs Abs: 2.7 10*3/uL (ref 0.7–4.0)
MCHC: 34 g/dL (ref 30.0–36.0)
MCV: 92.2 fl (ref 78.0–100.0)
Monocytes Absolute: 0.7 10*3/uL (ref 0.1–1.0)
Monocytes Relative: 6.7 % (ref 3.0–12.0)
Neutro Abs: 6.9 10*3/uL (ref 1.4–7.7)
Neutrophils Relative %: 65.6 % (ref 43.0–77.0)
Platelets: 291 10*3/uL (ref 150.0–400.0)
RBC: 4.89 Mil/uL (ref 4.22–5.81)
RDW: 13.9 % (ref 11.5–15.5)
WBC: 10.6 10*3/uL — ABNORMAL HIGH (ref 4.0–10.5)

## 2019-04-14 LAB — BASIC METABOLIC PANEL
BUN: 10 mg/dL (ref 6–23)
CO2: 27 mEq/L (ref 19–32)
Calcium: 9.5 mg/dL (ref 8.4–10.5)
Chloride: 98 mEq/L (ref 96–112)
Creatinine, Ser: 0.84 mg/dL (ref 0.40–1.50)
GFR: 92.1 mL/min (ref >60.00)
Glucose, Bld: 95 mg/dL (ref 70–99)
Potassium: 3.9 mEq/L (ref 3.5–5.1)
Sodium: 135 mEq/L (ref 135–145)

## 2019-04-14 LAB — LIPID PANEL
Cholesterol: 207 mg/dL — ABNORMAL HIGH (ref 0–200)
HDL: 43.9 mg/dL (ref >39.00)
LDL Cholesterol: 128 mg/dL — ABNORMAL HIGH (ref 0–99)
NonHDL: 163.49
Total CHOL/HDL Ratio: 5
Triglycerides: 177 mg/dL — ABNORMAL HIGH (ref 0.0–149.0)
VLDL: 35.4 mg/dL (ref 0.0–40.0)

## 2019-04-14 LAB — TSH: TSH: 1.56 u[IU]/mL (ref 0.35–4.50)

## 2019-04-14 LAB — PSA: PSA: 4.6

## 2019-04-14 LAB — FOLATE: Folate: >23.9 ng/mL (ref >5.9)

## 2019-04-14 MED ORDER — EDARBI 80 MG PO TABS: 1.0000 | ORAL_TABLET | Freq: Every day | ORAL | 1 refills | Status: DC

## 2019-04-14 MED ORDER — TRELEGY ELLIPTA 100-62.5-25 MCG/INH IN AEPB: 1.0000 | INHALATION_SPRAY | Freq: Every day | RESPIRATORY_TRACT | 1 refills | Status: DC

## 2019-04-14 MED ORDER — BREO ELLIPTA 100-25 MCG/INH IN AEPB: 1.0000 | INHALATION_SPRAY | Freq: Every day | RESPIRATORY_TRACT | 1 refills | Status: DC

## 2019-04-14 NOTE — Patient Instructions (Signed)

## 2019-04-14 NOTE — Progress Notes (Signed)
Subjective:  Patient ID: Oscar Phillips, male    DOB: 1956/04/04  Age: 63 y.o. MRN: [id number]  CC: Hypertension, Hyperlipidemia, and Coronary Artery Disease   HPI Oscar Phillips presents for f/up - He was recently discharged from a 30-day treatment program in [place] for alcoholism.  He tells me he is starting to feel better.  His last alcohol intake was over 2 months ago.  He has been trying to control his blood pressure with metoprolol and 50 mg of losartan.  He complains of a trigger finger in his left middle finger.  He tells me he has a poor appetite but he does not think he is losing weight.  He complains of constipation but denies abdominal pain, nausea, vomiting, diarrhea, melena, or bright red blood per rectum.  He has persistent cough and shortness of breath.  The cough is nonproductive.  He denies any recent episodes of chest pain.  Outpatient Medications Prior to Visit  Medication Sig Dispense Refill  . busPIRone (BUSPAR) 10 MG tablet TK 1 T PO  BID    . gabapentin (NEURONTIN) 300 MG capsule Take 1 capsule by mouth 2 (two) times daily.    . hydrOXYzine (ATARAX/VISTARIL) 25 MG tablet Take 1 Tablet BY MOUTH FOUR TIMES DAILY AS NEEDED FOR withdrawal symptoms    . metoprolol succinate (TOPROL-XL) 50 MG 24 hr tablet Take 1 tablet (50 mg total) by mouth daily. Take with or immediately following a meal. 90 tablet 0  . mirtazapine (REMERON) 15 MG tablet Take 1 tablet (15 mg total) by mouth at bedtime. 90 tablet 1  . Multiple Vitamins-Minerals (CERTAVITE/ANTIOXIDANTS) TABS Take 1 Tablet BY MOUTH EVERY DAY for 30 days    . Thiamine Mononitrate 100 MG TABS Take 1 Tablet BY MOUTH TWICE DAILY for 14 days    . BREO ELLIPTA 100-25 MCG/INH AEPB INL 1 PUFF ITL D    . Fluticasone-Umeclidin-Vilant (TRELEGY ELLIPTA) 100-62.5-25 MCG/INH AEPB Inhale 1 puff into the lungs daily. 90 each 1  . losartan (COZAAR) 25 MG tablet TK 2 TS PO D    . Melatonin 3 MG TABS Take 3 mg by mouth at bedtime.    .  chlordiazePOXIDE (LIBRIUM) 25 MG capsule Take 1 capsule (25 mg total) by mouth 3 (three) times daily as needed for anxiety. 21 capsule 0  . folic acid (FOLVITE) 1 MG tablet Take 1 tablet (1 mg total) by mouth daily. 30 tablet 1  . Theanine 100 MG CAPS Take 1 tablet by mouth daily.     No facility-administered medications prior to visit.     ROS Review of Systems  Constitutional: Positive for appetite change and unexpected weight change (wt loss). Negative for chills, diaphoresis, fatigue and fever.  HENT: Negative.  Negative for trouble swallowing.   Respiratory: Positive for cough and shortness of breath. Negative for chest tightness and wheezing.   Cardiovascular: Negative for chest pain, palpitations and leg swelling.  Gastrointestinal: Negative for abdominal pain, constipation, diarrhea, nausea and vomiting.  Endocrine: Negative.   Genitourinary: Negative.  Negative for difficulty urinating.  Musculoskeletal: Negative.  Negative for myalgias.  Skin: Negative.  Negative for color change and pallor.  Neurological: Negative.  Negative for dizziness, weakness, light-headedness and headaches.  Hematological: Negative for adenopathy. Does not bruise/bleed easily.  Psychiatric/Behavioral: Positive for confusion, decreased concentration, dysphoric mood and sleep disturbance. Negative for self-injury and suicidal ideas. The patient is nervous/anxious.     Objective:  BP (!) 158/70 (BP Location: Left Arm, Patient Position: Sitting,  Cuff Size: Normal)   Pulse 86   Temp 98.5 F (36.9 C) (Oral)   Ht 5\' 8"  (1.727 m)   Wt 151 lb 12 oz (68.8 kg)   SpO2 95%   BMI 23.07 kg/m   BP Readings from Last 3 Encounters:  04/14/19 (!) 158/70  02/06/19 130/73  11/08/18 (!) 150/85    Wt Readings from Last 3 Encounters:  04/14/19 151 lb 12 oz (68.8 kg)  11/08/18 158 lb (71.7 kg)  07/22/18 149 lb 4 oz (67.7 kg)    Physical Exam Vitals signs reviewed.  Constitutional:      General: He is not in  acute distress.    Appearance: He is not ill-appearing, toxic-appearing or diaphoretic.  HENT:     Nose: Nose normal.     Mouth/Throat:     Mouth: Mucous membranes are moist.     Pharynx: No oropharyngeal exudate.  Eyes:     General: No scleral icterus.    Conjunctiva/sclera: Conjunctivae normal.  Neck:     Musculoskeletal: Normal range of motion. No neck rigidity or muscular tenderness.  Cardiovascular:     Rate and Rhythm: Normal rate and regular rhythm.     Heart sounds: No murmur.  Pulmonary:     Effort: Pulmonary effort is normal.     Breath sounds: No stridor. No wheezing, rhonchi or rales.  Abdominal:     General: Abdomen is flat. Bowel sounds are normal. There is no distension.     Palpations: There is no hepatomegaly or splenomegaly.     Tenderness: There is no abdominal tenderness.  Musculoskeletal: Normal range of motion.     Right lower leg: No edema.     Left lower leg: No edema.  Lymphadenopathy:     Cervical: No cervical adenopathy.  Skin:    General: Skin is warm and dry.     Coloration: Skin is not pale.  Neurological:     General: No focal deficit present.     Mental Status: Mental status is at baseline.  Psychiatric:        Attention and Perception: He is inattentive.        Mood and Affect: Mood is anxious. Mood is not depressed or elated. Affect is flat. Affect is not labile.        Speech: Speech normal. Speech is not delayed or tangential.        Behavior: Behavior is slowed and withdrawn. Behavior is cooperative.        Thought Content: Thought content normal. Thought content is not paranoid or delusional. Thought content does not include homicidal or suicidal ideation.        Cognition and Memory: Cognition is impaired. Memory is impaired. He exhibits impaired recent memory and impaired remote memory.        Judgment: Judgment normal.     Lab Results  Component Value Date   WBC 10.6 (H) 04/14/2019   HGB 15.3 04/14/2019   HCT 45.1 04/14/2019    PLT 291.0 04/14/2019   GLUCOSE 95 04/14/2019   CHOL 207 (H) 04/14/2019   TRIG 177.0 (H) 04/14/2019   HDL 43.90 04/14/2019   LDLDIRECT 154.1 06/03/2010   LDLCALC 128 (H) 04/14/2019   ALT 15 04/15/2019   AST 22 04/15/2019   NA 135 04/14/2019   K 3.9 04/14/2019   CL 98 04/14/2019   CREATININE 0.84 04/14/2019   BUN 10 04/14/2019   CO2 27 04/14/2019   TSH 1.56 04/14/2019   PSA 4.6 04/14/2019  INR 0.86 07/03/2018   HGBA1C 5.5 03/02/2018    Dg Chest 2 View  Result Date: 02/06/2019 CLINICAL DATA:  63 year old male with fatigue EXAM: CHEST - 2 VIEW COMPARISON:  07/06/2018 FINDINGS: Cardiomediastinal silhouette unchanged. Compared to the prior plain film there is improved aeration with resolution of the left-sided findings. Coarsened interstitial markings persist. Stigmata of emphysema, with increased retrosternal airspace, flattened hemidiaphragms, increased AP diameter, and hyperinflation on the AP view. No confluent airspace disease, pneumothorax, or pleural effusion. No displaced fracture. IMPRESSION: Emphysema without evidence of acute cardiopulmonary disease Electronically Signed   By: Corrie Mckusick D.O.   On: 02/06/2019 18:34    Assessment & Plan:   Mohab was seen today for hypertension, hyperlipidemia and coronary artery disease.  Diagnoses and all orders for this visit:  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 exacerbation ([place])- For maximum symptom relief and to reduce the risk of exacerbations I have asked him to use a triple inhaler. -     Discontinue: BREO ELLIPTA 100-25 MCG/INH AEPB; Inhale 1 puff into the lungs daily. -     Fluticasone-Umeclidin-Vilant (TRELEGY ELLIPTA) 100-62.5-25 MCG/INH AEPB; Inhale 1 puff into the lungs daily.  Coronary artery disease involving native coronary artery of native heart without angina pectoris- He has had no recent episodes of angina.  Will continue to work on risk factor modifications. -     Lipid panel; Future -     Azilsartan Medoxomil  (EDARBI) 80 MG TABS; Take 1 tablet (80 mg total) by mouth daily. -     rosuvastatin (CRESTOR) 20 MG tablet; Take 1 tablet (20 mg total) by mouth daily.  Essential hypertension- His blood pressure is not adequately well controlled.  I have asked him to upgrade to a more potent ARB. -     Basic metabolic panel; Future -     TSH; Future -     Azilsartan Medoxomil (EDARBI) 80 MG TABS; Take 1 tablet (80 mg total) by mouth daily.  B12 deficiency- His B12 level is borderline low.  I have asked him to continue taking the high-dose oral B12 supplement -     CBC with Differential/Platelet; Future -     Vitamin B12; Future -     Folate; Future -     cyanocobalamin 2000 MCG tablet; Take 1 tablet (2,000 mcg total) by mouth daily.  Hyperlipidemia with target LDL less than 100- He has not achieved his LDL goal and has an elevated ASCVD risk score.  I have asked him to restart the statin. -     Lipid panel; Future -     rosuvastatin (CRESTOR) 20 MG tablet; Take 1 tablet (20 mg total) by mouth daily. 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  He has been free from alcohol for 2 months.  I expect that this may improve over the next year or 2 if he continues to abstain from alcohol intake. -     Vitamin B1; Future -     Vitamin B12; Future -     Folate; Future  PSA elevation- His PSA has not risen over the last year.  This is a reassuring sign that he does not have prostate cancer. -     PSA, total and free; Future  Chronic alcohol abuse- Improvement noted. -     Vitamin B1; Future -     Hepatic function panel; Future  PAD (peripheral artery disease) ([place])- Will continue treating  risk factor modifications. -     rosuvastatin (CRESTOR) 20 MG tablet; Take 1 tablet (20 mg total) by mouth daily.  Trigger middle finger of left hand -     Ambulatory referral to Orthopedic Surgery   I have discontinued Walker Shoff's  Melatonin, folic acid, Theanine, chlordiazePOXIDE, Breo Ellipta, losartan, and Breo Ellipta. I am also having him start on Edarbi, rosuvastatin, and cyanocobalamin. Additionally, I am having him maintain his mirtazapine, gabapentin, metoprolol succinate, Thiamine Mononitrate, busPIRone, hydrOXYzine, CertaVite/Antioxidants, and Trelegy Ellipta.  Meds ordered this encounter  Medications  . DISCONTD: BREO ELLIPTA 100-25 MCG/INH AEPB    Sig: Inhale 1 puff into the lungs daily.    Dispense:  90 each    Refill:  1  . Fluticasone-Umeclidin-Vilant (TRELEGY ELLIPTA) 100-62.5-25 MCG/INH AEPB    Sig: Inhale 1 puff into the lungs daily.    Dispense:  90 each    Refill:  1  . Azilsartan Medoxomil (EDARBI) 80 MG TABS    Sig: Take 1 tablet (80 mg total) by mouth daily.    Dispense:  90 tablet    Refill:  1  . rosuvastatin (CRESTOR) 20 MG tablet    Sig: Take 1 tablet (20 mg total) by mouth daily.    Dispense:  90 tablet    Refill:  1  . cyanocobalamin 2000 MCG tablet    Sig: Take 1 tablet (2,000 mcg total) by mouth daily.    Dispense:  90 tablet    Refill:  1     Follow-up: Return in about 3 months (around 07/15/2019).  Scarlette Calico, MD

## 2019-04-15 ENCOUNTER — Other Ambulatory Visit (INDEPENDENT_AMBULATORY_CARE_PROVIDER_SITE_OTHER): Payer: BC Managed Care – PPO

## 2019-04-15 ENCOUNTER — Encounter: Payer: Self-pay | Admitting: Internal Medicine

## 2019-04-15 DIAGNOSIS — E785 Hyperlipidemia, unspecified: Secondary | ICD-10-CM | POA: Diagnosis not present

## 2019-04-15 DIAGNOSIS — F1010 Alcohol abuse, uncomplicated: Secondary | ICD-10-CM

## 2019-04-15 DIAGNOSIS — M65332 Trigger finger, left middle finger: Secondary | ICD-10-CM | POA: Insufficient documentation

## 2019-04-15 LAB — HEPATIC FUNCTION PANEL
ALT: 15 U/L (ref 0–53)
AST: 22 U/L (ref 0–37)
Albumin: 4.8 g/dL (ref 3.5–5.2)
Alkaline Phosphatase: 55 U/L (ref 39–117)
Bilirubin, Direct: 0.1 mg/dL (ref 0.0–0.3)
Total Bilirubin: 0.8 mg/dL (ref 0.2–1.2)
Total Protein: 7.5 g/dL (ref 6.0–8.3)

## 2019-04-15 MED ORDER — ROSUVASTATIN CALCIUM 20 MG PO TABS: 20.0000 mg | ORAL_TABLET | Freq: Every day | ORAL | 1 refills | Status: DC

## 2019-04-15 MED ORDER — CYANOCOBALAMIN 2000 MCG PO TABS: 2000.0000 ug | ORAL_TABLET | Freq: Every day | ORAL | 1 refills | Status: DC

## 2019-04-20 LAB — PSA, TOTAL AND FREE
PSA, % Free: 15 % (calc) — ABNORMAL LOW (ref >25)
PSA, Free: 0.7 ng/mL
PSA, Total: 4.6 ng/mL — ABNORMAL HIGH (ref <=4.0)

## 2019-04-20 LAB — VITAMIN B1: Vitamin B1 (Thiamine): 60 nmol/L — ABNORMAL HIGH (ref 8–30)

## 2019-04-21 ENCOUNTER — Encounter: Payer: Self-pay | Admitting: Internal Medicine

## 2019-05-01 ENCOUNTER — Encounter: Payer: Self-pay | Admitting: Orthopaedic Surgery

## 2019-05-01 ENCOUNTER — Other Ambulatory Visit: Payer: Self-pay

## 2019-05-01 ENCOUNTER — Ambulatory Visit (INDEPENDENT_AMBULATORY_CARE_PROVIDER_SITE_OTHER): Payer: BC Managed Care – PPO | Admitting: Orthopaedic Surgery

## 2019-05-01 VITALS — Ht 68.0 in | Wt 155.0 lb

## 2019-05-01 DIAGNOSIS — M65332 Trigger finger, left middle finger: Secondary | ICD-10-CM | POA: Diagnosis not present

## 2019-05-01 MED ORDER — METHYLPREDNISOLONE ACETATE 40 MG/ML IJ SUSP
13.3300 mg | INTRAMUSCULAR | Status: AC | PRN
  Administered 2019-05-01: 13.33 mg

## 2019-05-01 MED ORDER — BUPIVACAINE HCL 0.5 % IJ SOLN
0.3300 mL | INTRAMUSCULAR | Status: AC | PRN
  Administered 2019-05-01: .33 mL

## 2019-05-01 MED ORDER — LIDOCAINE HCL 1 % IJ SOLN
0.3000 mL | INTRAMUSCULAR | Status: AC | PRN
  Administered 2019-05-01: 10:00:00 .3 mL

## 2019-05-01 NOTE — Progress Notes (Signed)
Office Visit Note   Patient: Oscar Phillips           Date of Birth: 25-Mar-1956           MRN: [id number] Visit Date: 05/01/2019              Requested by: Janith Lima, MD [address] [place],  [place] [address] PCP: Janith Lima, MD   Assessment & Plan: Visit Diagnoses:  1. Trigger finger, left middle finger     Plan: Impression is left middle trigger finger.  Injection was performed today into the A1 pulley.  Patient tolerated well.  We will see him back as needed.  Follow-Up Instructions: Return if symptoms worsen or fail to improve.   Orders:  No orders of the defined types were placed in this encounter.  No orders of the defined types were placed in this encounter.     Procedures: Hand/UE Inj: L long A1 for trigger finger on 05/01/2019 9:38 AM Indications: pain Details: 25 G needle Medications: 0.3 mL lidocaine 1 %; 0.33 mL bupivacaine 0.5 %; 13.33 mg methylPREDNISolone acetate 40 MG/ML Outcome: tolerated well, no immediate complications Consent was given by the patient. Patient was prepped and draped in the usual sterile fashion.       Clinical Data: No additional findings.   Subjective: Chief Complaint  Patient presents with  . Left Middle Finger - Pain    [name] is a very pleasant 63 year old gentleman who comes in for evaluation of a left middle trigger finger.  He is right-hand dominant.  This is been ongoing for the last several months and occasionally it will get locked up and will cause significant pain.  Denies any numbness and tingling.  He is retired.   Review of Systems  Constitutional: Negative.   All other systems reviewed and are negative.    Objective: Vital Signs: Ht 5\' 8"  (1.727 m)   Wt 155 lb (70.3 kg)   BMI 23.57 kg/m   Physical Exam Vitals signs and nursing note reviewed.  Constitutional:      Appearance: He is well-developed.  HENT:     Head: Normocephalic and atraumatic.  Eyes:     Pupils:  Pupils are equal, round, and reactive to light.  Neck:     Musculoskeletal: Neck supple.  Pulmonary:     Effort: Pulmonary effort is normal.  Abdominal:     Palpations: Abdomen is soft.  Musculoskeletal: Normal range of motion.  Skin:    General: Skin is warm.  Neurological:     Mental Status: He is alert and oriented to person, place, and time.  Psychiatric:        Behavior: Behavior normal.        Thought Content: Thought content normal.        Judgment: Judgment normal.     Ortho Exam Left hand exam shows a tender nodule near the A1 pulley of the middle finger.  There is triggering but no locking of the finger.  Neurovascularly intact. Specialty Comments:  No specialty comments available.  Imaging: No results found.   PMFS History: Patient Active Problem List   Diagnosis Date Noted  . Trigger middle finger of left hand 04/15/2019  . 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 Coronary artery disease involving native coronary artery of native heart without angina pectoris 07/19/2018  . Esophageal dysphagia 04/17/2018  . PAD (peripheral artery disease) ([place]) 04/17/2018  . 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 GAD (generalized anxiety disorder)   . MDD (major depressive disorder), recurrent episode, severe ([place]) 04/01/2018  . Chronic alcohol abuse 03/02/2018  . Colon cancer screening 05/24/2016  . PSA elevation 08/23/2015  . Routine general medical examination at a health care facility 05/20/2015  . Essential hypertension 05/20/2015  . 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 exacerbation ([place]) 05/20/2015  . Hyperlipidemia with target LDL less than 100 05/20/2015  . B12 deficiency 09/21/2010  . Irritable bowel syndrome 09/20/2010  . TOBACCO USE 06/03/2010  . GERD 06/03/2010   Past Medical History:  Diagnosis Date  . Abdominal bruit   . Alcoholism ([place])   . Anxiety   . B12 deficiency   . Bronchitis    . COPD (chronic obstructive pulmonary disease) ([place])   . GERD (gastroesophageal reflux disease)   . H/O trichomonal urethritis   . History of panic attacks   . History of rectal bleeding    in August 2016  . HLD (hyperlipidemia)   . HTN (hypertension)   . OCD (obsessive compulsive disorder)   . PSA elevation     Family History  Problem Relation Age of Onset  . Hyperlipidemia Father   . Colon cancer Father   . Stomach cancer Father   . Dementia Mother   . Alcohol abuse Maternal Uncle   . Lung cancer Maternal Uncle   . Heart failure Paternal Grandmother   . Emphysema Paternal Grandmother   . Diabetes Neg Hx   . Drug abuse Neg Hx   . Early death Neg Hx   . Heart disease Neg Hx   . Hypertension Neg Hx   . Kidney disease Neg Hx   . Stroke Neg Hx     Past Surgical History:  Procedure Laterality Date  . ELBOW FRACTURE SURGERY Left   . LEFT HEART CATH AND CORONARY ANGIOGRAPHY N/A 07/04/2018   Procedure: LEFT HEART CATH AND CORONARY ANGIOGRAPHY;  Surgeon: Belva Crome, MD;  Location: [facility];  Service: Cardiovascular;  Laterality: N/A;  . LUMBAR [place]    . TONSILLECTOMY  1965   Social History   Occupational History  . Occupation: Web designer, Retired    Fish farm manager: [employer]  Tobacco Use  . Smoking status: Current Every Day Smoker    Packs/day: 1.00    Years: 25.00    Pack years: 25.00    Types: Cigarettes  . Smokeless tobacco: Never Used  Substance and Sexual Activity  . Alcohol use: Yes    Alcohol/week: 15.0 standard drinks    Types: 15 Glasses of wine per week  . Drug use: No  . Sexual activity: Not Currently

## 2019-05-14 ENCOUNTER — Other Ambulatory Visit: Payer: Self-pay | Admitting: Internal Medicine

## 2019-05-14 DIAGNOSIS — F332 Major depressive disorder, recurrent severe without psychotic features: Secondary | ICD-10-CM

## 2019-05-14 DIAGNOSIS — I2510 Atherosclerotic heart disease of native coronary artery without angina pectoris: Secondary | ICD-10-CM

## 2019-05-14 DIAGNOSIS — I10 Essential (primary) hypertension: Secondary | ICD-10-CM

## 2019-05-14 MED ORDER — BUSPIRONE HCL 10 MG PO TABS: ORAL_TABLET | ORAL | 1 refills | Status: DC

## 2019-05-14 MED ORDER — METOPROLOL SUCCINATE ER 50 MG PO TB24: 50.0000 mg | ORAL_TABLET | Freq: Every day | ORAL | 1 refills | Status: DC

## 2019-05-20 ENCOUNTER — Other Ambulatory Visit: Payer: Self-pay | Admitting: Internal Medicine

## 2019-05-20 DIAGNOSIS — F332 Major depressive disorder, recurrent severe without psychotic features: Secondary | ICD-10-CM

## 2019-05-20 MED ORDER — BUSPIRONE HCL 10 MG PO TABS: ORAL_TABLET | ORAL | 1 refills | Status: DC

## 2019-06-13 DIAGNOSIS — F410 Panic disorder [episodic paroxysmal anxiety] without agoraphobia: Secondary | ICD-10-CM | POA: Diagnosis not present

## 2019-07-10 ENCOUNTER — Other Ambulatory Visit: Payer: Self-pay | Admitting: Internal Medicine

## 2019-07-10 DIAGNOSIS — I10 Essential (primary) hypertension: Secondary | ICD-10-CM

## 2019-07-10 DIAGNOSIS — I2510 Atherosclerotic heart disease of native coronary artery without angina pectoris: Secondary | ICD-10-CM

## 2019-07-25 DIAGNOSIS — F410 Panic disorder [episodic paroxysmal anxiety] without agoraphobia: Secondary | ICD-10-CM | POA: Diagnosis not present

## 2019-07-25 DIAGNOSIS — F3342 Major depressive disorder, recurrent, in full remission: Secondary | ICD-10-CM | POA: Diagnosis not present

## 2019-08-04 ENCOUNTER — Encounter: Payer: Self-pay | Admitting: Internal Medicine

## 2019-08-04 ENCOUNTER — Ambulatory Visit (INDEPENDENT_AMBULATORY_CARE_PROVIDER_SITE_OTHER): Payer: BC Managed Care – PPO | Admitting: Internal Medicine

## 2019-08-04 ENCOUNTER — Other Ambulatory Visit: Payer: Self-pay

## 2019-08-04 VITALS — BP 176/80 | HR 104 | Temp 98.2°F | Resp 16 | Ht 68.0 in | Wt 165.0 lb

## 2019-08-04 DIAGNOSIS — I10 Essential (primary) hypertension: Secondary | ICD-10-CM | POA: Diagnosis not present

## 2019-08-04 DIAGNOSIS — J449 Chronic obstructive pulmonary disease, unspecified: Secondary | ICD-10-CM

## 2019-08-04 MED ORDER — NEBIVOLOL HCL 5 MG PO TABS: 5.0000 mg | ORAL_TABLET | Freq: Every day | ORAL | 0 refills | Status: DC

## 2019-08-04 MED ORDER — ALBUTEROL SULFATE HFA 108 (90 BASE) MCG/ACT IN AERS: 2.0000 | INHALATION_SPRAY | Freq: Four times a day (QID) | RESPIRATORY_TRACT | 3 refills | Status: DC | PRN

## 2019-08-04 NOTE — Progress Notes (Signed)
Subjective:  Patient ID: Oscar Phillips, male    DOB: Aug 27, 1956  Age: 63 y.o. MRN: [id number]  CC: Hypertension and COPD   HPI Noyan Toledo presents for f/up -he stopped taking metoprolol several weeks ago.  He is not actually sure why or when.  He has not been monitoring his blood pressure.  He has been walking 3 to 6 miles every other day and though he does experience some shortness of breath and DOE he does not experience chest pain, diaphoresis, dizziness, lightheadedness, or fatigue.  He wants prescription for a rescue albuterol inhaler.  He continues to drink at least 6 glasses of wine per day.  Outpatient Medications Prior to Visit  Medication Sig Dispense Refill   busPIRone (BUSPAR) 10 MG tablet TK 1 T PO  BID 270 tablet 1   cyanocobalamin 2000 MCG tablet Take 1 tablet (2,000 mcg total) by mouth daily. 90 tablet 1   EDARBI 80 MG TABS TAKE ONE TABLET BY MOUTH DAILY.  90 tablet 1   Fluticasone-Umeclidin-Vilant (TRELEGY ELLIPTA) 100-62.5-25 MCG/INH AEPB Inhale 1 puff into the lungs daily. 90 each 1   gabapentin (NEURONTIN) 300 MG capsule Take 1 capsule by mouth 2 (two) times daily.     hydrOXYzine (ATARAX/VISTARIL) 25 MG tablet Take 1 Tablet BY MOUTH FOUR TIMES DAILY AS NEEDED FOR withdrawal symptoms     mirtazapine (REMERON) 15 MG tablet Take 1 tablet (15 mg total) by mouth at bedtime. 90 tablet 1   Multiple Vitamins-Minerals (CERTAVITE/ANTIOXIDANTS) TABS Take 1 Tablet BY MOUTH EVERY DAY for 30 days     rosuvastatin (CRESTOR) 20 MG tablet Take 1 tablet (20 mg total) by mouth daily. 90 tablet 1   Thiamine Mononitrate 100 MG TABS Take 1 Tablet BY MOUTH TWICE DAILY for 14 days     metoprolol succinate (TOPROL-XL) 50 MG 24 hr tablet Take 1 tablet (50 mg total) by mouth daily. 90 tablet 1   No facility-administered medications prior to visit.     ROS Review of Systems  Constitutional: Negative for appetite change, chills, diaphoresis and fatigue.  HENT: Negative.     Eyes: Negative for visual disturbance.  Respiratory: Positive for shortness of breath and wheezing. Negative for cough and chest tightness.   Cardiovascular: Negative for chest pain, palpitations and leg swelling.  Gastrointestinal: Negative for abdominal pain, constipation, diarrhea, nausea and vomiting.  Genitourinary: Negative.   Musculoskeletal: Negative for arthralgias and myalgias.  Skin: Negative.  Negative for color change.  Neurological: Negative for dizziness, weakness and light-headedness.  Hematological: Negative for adenopathy. Does not bruise/bleed easily.  Psychiatric/Behavioral: Negative.     Objective:  BP (!) 176/80 (BP Location: Left Arm, Patient Position: Sitting, Cuff Size: Normal)    Pulse (!) 104    Temp 98.2 F (36.8 C) (Oral)    Resp 16    Ht 5\' 8"  (1.727 m)    Wt 165 lb (74.8 kg)    SpO2 94%    BMI 25.09 kg/m   BP Readings from Last 3 Encounters:  08/04/19 (!) 176/80  04/14/19 (!) 158/70  02/06/19 130/73    Wt Readings from Last 3 Encounters:  08/04/19 165 lb (74.8 kg)  05/01/19 155 lb (70.3 kg)  04/14/19 151 lb 12 oz (68.8 kg)    Physical Exam Vitals signs reviewed.  Constitutional:      General: He is not in acute distress.    Appearance: He is not ill-appearing, toxic-appearing or diaphoretic.  HENT:     Nose: Nose normal.  Mouth/Throat:     Mouth: Mucous membranes are moist.  Eyes:     General: No scleral icterus.    Conjunctiva/sclera: Conjunctivae normal.  Neck:     Musculoskeletal: Normal range of motion. No muscular tenderness.  Cardiovascular:     Rate and Rhythm: Normal rate.     Heart sounds: No murmur.  Pulmonary:     Effort: Pulmonary effort is normal. No tachypnea, accessory muscle usage, respiratory distress or retractions.     Breath sounds: Examination of the right-middle field reveals rhonchi. Examination of the left-middle field reveals rhonchi. Examination of the right-lower field reveals rhonchi. Examination of the  left-lower field reveals rhonchi. Rhonchi present. No decreased breath sounds, wheezing or rales.  Abdominal:     General: Abdomen is flat. Bowel sounds are normal. There is no distension.     Palpations: There is no mass.     Tenderness: There is no abdominal tenderness.  Musculoskeletal: Normal range of motion.     Right lower leg: No edema.     Left lower leg: No edema.  Lymphadenopathy:     Cervical: No cervical adenopathy.  Skin:    General: Skin is warm and dry.  Neurological:     General: No focal deficit present.     Mental Status: He is alert.     Lab Results  Component Value Date   WBC 10.6 (H) 04/14/2019   HGB 15.3 04/14/2019   HCT 45.1 04/14/2019   PLT 291.0 04/14/2019   GLUCOSE 95 04/14/2019   CHOL 207 (H) 04/14/2019   TRIG 177.0 (H) 04/14/2019   HDL 43.90 04/14/2019   LDLDIRECT 154.1 06/03/2010   LDLCALC 128 (H) 04/14/2019   ALT 15 04/15/2019   AST 22 04/15/2019   NA 135 04/14/2019   K 3.9 04/14/2019   CL 98 04/14/2019   CREATININE 0.84 04/14/2019   BUN 10 04/14/2019   CO2 27 04/14/2019   TSH 1.56 04/14/2019   PSA 4.6 04/14/2019   INR 0.86 07/03/2018   HGBA1C 5.5 03/02/2018    Dg Chest 2 View  Result Date: 02/06/2019 CLINICAL DATA:  63 year old male with fatigue EXAM: CHEST - 2 VIEW COMPARISON:  07/06/2018 FINDINGS: Cardiomediastinal silhouette unchanged. Compared to the prior plain film there is improved aeration with resolution of the left-sided findings. Coarsened interstitial markings persist. Stigmata of emphysema, with increased retrosternal airspace, flattened hemidiaphragms, increased AP diameter, and hyperinflation on the AP view. No confluent airspace disease, pneumothorax, or pleural effusion. No displaced fracture. IMPRESSION: Emphysema without evidence of acute cardiopulmonary disease Electronically Signed   By: Corrie Mckusick D.O.   On: 02/06/2019 18:34    Assessment & Plan:   Lavere was seen today for hypertension and copd.  Diagnoses  and all orders for this visit:  Essential hypertension- His blood pressure is not adequately well controlled.  I have asked him to restart a beta-blocker but I want him to use a cardioselective 1 so I gave him samples of nebivolol.  Will start at 5 mg a day.  Will increase the dose as needed and as tolerated.  I have asked him to stay on the current dose of the ARB. -     nebivolol (BYSTOLIC) 5 MG tablet; Take 1 tablet (5 mg total) by mouth daily.  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 exacerbation ([place])- He will continue using the LABA/LAMA/ICS inhaler and will use albuterol as needed. -     albuterol (VENTOLIN HFA) 108 (90 Base) MCG/ACT inhaler; Inhale 2 puffs into the  lungs every 6 (six) hours as needed for wheezing or shortness of breath.   I have discontinued Shain Abraha's metoprolol succinate. I am also having him start on albuterol and nebivolol. Additionally, I am having him maintain his mirtazapine, gabapentin, Thiamine Mononitrate, hydrOXYzine, CertaVite/Antioxidants, Trelegy Ellipta, rosuvastatin, cyanocobalamin, busPIRone, and Edarbi.  Meds ordered this encounter  Medications   albuterol (VENTOLIN HFA) 108 (90 Base) MCG/ACT inhaler    Sig: Inhale 2 puffs into the lungs every 6 (six) hours as needed for wheezing or shortness of breath.    Dispense:  18 g    Refill:  3   nebivolol (BYSTOLIC) 5 MG tablet    Sig: Take 1 tablet (5 mg total) by mouth daily.    Dispense:  63 tablet    Refill:  0     Follow-up: Return in about 2 months (around 10/04/2019).  Scarlette Calico, MD

## 2019-08-04 NOTE — Patient Instructions (Signed)

## 2019-08-18 ENCOUNTER — Other Ambulatory Visit: Payer: Self-pay | Admitting: Internal Medicine

## 2019-08-18 DIAGNOSIS — J449 Chronic obstructive pulmonary disease, unspecified: Secondary | ICD-10-CM

## 2019-08-18 MED ORDER — TRELEGY ELLIPTA 100-62.5-25 MCG/INH IN AEPB: 1.0000 | INHALATION_SPRAY | Freq: Every day | RESPIRATORY_TRACT | 1 refills | Status: DC

## 2019-09-26 ENCOUNTER — Other Ambulatory Visit: Payer: Self-pay | Admitting: Internal Medicine

## 2019-09-26 DIAGNOSIS — J449 Chronic obstructive pulmonary disease, unspecified: Secondary | ICD-10-CM

## 2019-09-26 MED ORDER — TRELEGY ELLIPTA 100-62.5-25 MCG/INH IN AEPB: 1.0000 | INHALATION_SPRAY | Freq: Every day | RESPIRATORY_TRACT | 1 refills | Status: DC

## 2019-10-15 ENCOUNTER — Other Ambulatory Visit: Payer: Self-pay | Admitting: Internal Medicine

## 2019-10-15 DIAGNOSIS — I739 Peripheral vascular disease, unspecified: Secondary | ICD-10-CM

## 2019-10-15 DIAGNOSIS — I2510 Atherosclerotic heart disease of native coronary artery without angina pectoris: Secondary | ICD-10-CM

## 2019-10-15 DIAGNOSIS — E785 Hyperlipidemia, unspecified: Secondary | ICD-10-CM

## 2019-11-14 DIAGNOSIS — F410 Panic disorder [episodic paroxysmal anxiety] without agoraphobia: Secondary | ICD-10-CM | POA: Diagnosis not present

## 2019-11-17 ENCOUNTER — Telehealth: Payer: Self-pay

## 2019-11-17 ENCOUNTER — Other Ambulatory Visit: Payer: Self-pay | Admitting: Internal Medicine

## 2019-11-17 DIAGNOSIS — J449 Chronic obstructive pulmonary disease, unspecified: Secondary | ICD-10-CM

## 2019-11-17 NOTE — Telephone Encounter (Signed)
New message    Medication Requested:  Is medication on med list (if no, inform pt they may need an appointment):   Fluticasone-Umeclidin-Vilant (TRELEGY ELLIPTA) 100-62.5-25 MCG/INH AEPB  albuterol (VENTOLIN HFA) 108 (90 Base) MCG/ACT inhaler  Is medication a controled (yes = last OV with PCP): no  Is the OV > than 4 months (yes = schedule an appt if one is not already made): last seen  08/04/19   Pharmacy ([address], [place]): [facility] on [address]

## 2019-11-18 MED ORDER — ALBUTEROL SULFATE HFA 108 (90 BASE) MCG/ACT IN AERS: 2.0000 | INHALATION_SPRAY | Freq: Four times a day (QID) | RESPIRATORY_TRACT | 2 refills | Status: DC | PRN

## 2019-11-18 NOTE — Telephone Encounter (Signed)
Late entry - called pharmacy and confirmed that rx for Trelegy. Pharmacy stated that pt has already picked up the prescription.   Albuterol is out of refill. erx sent.

## 2020-01-19 ENCOUNTER — Other Ambulatory Visit: Payer: Self-pay | Admitting: Internal Medicine

## 2020-01-19 DIAGNOSIS — J449 Chronic obstructive pulmonary disease, unspecified: Secondary | ICD-10-CM

## 2020-01-19 DIAGNOSIS — J4489 Other specified chronic obstructive pulmonary disease: Secondary | ICD-10-CM

## 2020-02-05 ENCOUNTER — Other Ambulatory Visit: Payer: Self-pay | Admitting: Internal Medicine

## 2020-02-05 DIAGNOSIS — I2510 Atherosclerotic heart disease of native coronary artery without angina pectoris: Secondary | ICD-10-CM

## 2020-02-05 DIAGNOSIS — I10 Essential (primary) hypertension: Secondary | ICD-10-CM

## 2020-03-23 ENCOUNTER — Telehealth: Payer: Self-pay

## 2020-03-23 NOTE — Telephone Encounter (Signed)
Pt contacted - pt stated that he experienced a bout of chest pain that felt like indigestion. Pt stated that it lasted a short while and then went away. Informed pt that it is recommended that he go to the ED due to the type of pain he described. Pt stated that he did not want to go to the ED and that if it happened again, then he would. Pt stated that the pain was not in the center of his chest and that he was not currently experiencing any pain. Pt denied any SOB, DOE or palpitations. Offered to transfer patient to the nurse triage line, pt declined.   Pt scheduled with PCP tomorrow. Informed pt to seek immediate medical attention if the pain reoccurs prior to his appointment.

## 2020-03-23 NOTE — Telephone Encounter (Signed)
New message    The patient did not want to disclose any information will discuss when the CMA calls back.

## 2020-03-24 ENCOUNTER — Ambulatory Visit (INDEPENDENT_AMBULATORY_CARE_PROVIDER_SITE_OTHER): Payer: BC Managed Care – PPO

## 2020-03-24 ENCOUNTER — Encounter: Payer: Self-pay | Admitting: Internal Medicine

## 2020-03-24 ENCOUNTER — Ambulatory Visit: Payer: BC Managed Care – PPO | Admitting: Internal Medicine

## 2020-03-24 ENCOUNTER — Other Ambulatory Visit: Payer: Self-pay

## 2020-03-24 VITALS — BP 180/90 | HR 118 | Temp 98.5°F | Resp 16 | Ht 68.0 in | Wt 165.0 lb

## 2020-03-24 DIAGNOSIS — I2510 Atherosclerotic heart disease of native coronary artery without angina pectoris: Secondary | ICD-10-CM

## 2020-03-24 DIAGNOSIS — R0781 Pleurodynia: Secondary | ICD-10-CM

## 2020-03-24 DIAGNOSIS — E538 Deficiency of other specified B group vitamins: Secondary | ICD-10-CM | POA: Diagnosis not present

## 2020-03-24 DIAGNOSIS — E278 Other specified disorders of adrenal gland: Secondary | ICD-10-CM

## 2020-03-24 DIAGNOSIS — R972 Elevated prostate specific antigen [PSA]: Secondary | ICD-10-CM | POA: Diagnosis not present

## 2020-03-24 DIAGNOSIS — K2100 Gastro-esophageal reflux disease with esophagitis, without bleeding: Secondary | ICD-10-CM

## 2020-03-24 DIAGNOSIS — I10 Essential (primary) hypertension: Secondary | ICD-10-CM

## 2020-03-24 DIAGNOSIS — F1010 Alcohol abuse, uncomplicated: Secondary | ICD-10-CM

## 2020-03-24 DIAGNOSIS — R000 Tachycardia, unspecified: Secondary | ICD-10-CM

## 2020-03-24 DIAGNOSIS — R1310 Dysphagia, unspecified: Secondary | ICD-10-CM

## 2020-03-24 DIAGNOSIS — R7989 Other specified abnormal findings of blood chemistry: Secondary | ICD-10-CM | POA: Insufficient documentation

## 2020-03-24 DIAGNOSIS — R1319 Other dysphagia: Secondary | ICD-10-CM

## 2020-03-24 LAB — BASIC METABOLIC PANEL
BUN: 6 mg/dL (ref 6–23)
CO2: 24 mEq/L (ref 19–32)
Calcium: 9.4 mg/dL (ref 8.4–10.5)
Chloride: 93 mEq/L — ABNORMAL LOW (ref 96–112)
Creatinine, Ser: 0.65 mg/dL (ref 0.40–1.50)
GFR: 123.44 mL/min (ref >60.00)
Glucose, Bld: 103 mg/dL — ABNORMAL HIGH (ref 70–99)
Potassium: 3.8 mEq/L (ref 3.5–5.1)
Sodium: 130 mEq/L — ABNORMAL LOW (ref 135–145)

## 2020-03-24 LAB — HEPATIC FUNCTION PANEL
ALT: 66 U/L — ABNORMAL HIGH (ref 0–53)
AST: 53 U/L — ABNORMAL HIGH (ref 0–37)
Albumin: 4.5 g/dL (ref 3.5–5.2)
Alkaline Phosphatase: 75 U/L (ref 39–117)
Bilirubin, Direct: 0.3 mg/dL (ref 0.0–0.3)
Total Bilirubin: 0.8 mg/dL (ref 0.2–1.2)
Total Protein: 7 g/dL (ref 6.0–8.3)

## 2020-03-24 LAB — FOLATE: Folate: >24.8 ng/mL (ref >5.9)

## 2020-03-24 LAB — CBC WITH DIFFERENTIAL/PLATELET
Basophils Absolute: 0 10*3/uL (ref 0.0–0.1)
Basophils Relative: 0.3 % (ref 0.0–3.0)
Eosinophils Absolute: 0 10*3/uL (ref 0.0–0.7)
Eosinophils Relative: 0.3 % (ref 0.0–5.0)
HCT: 45.2 % (ref 39.0–52.0)
Hemoglobin: 15.8 g/dL (ref 13.0–17.0)
Lymphocytes Relative: 13.7 % (ref 12.0–46.0)
Lymphs Abs: 1.5 10*3/uL (ref 0.7–4.0)
MCHC: 34.9 g/dL (ref 30.0–36.0)
MCV: 95.1 fl (ref 78.0–100.0)
Monocytes Absolute: 0.9 10*3/uL (ref 0.1–1.0)
Monocytes Relative: 8.1 % (ref 3.0–12.0)
Neutro Abs: 8.6 10*3/uL — ABNORMAL HIGH (ref 1.4–7.7)
Neutrophils Relative %: 77.6 % — ABNORMAL HIGH (ref 43.0–77.0)
Platelets: 277 10*3/uL (ref 150.0–400.0)
RBC: 4.75 Mil/uL (ref 4.22–5.81)
RDW: 13.6 % (ref 11.5–15.5)
WBC: 11.1 10*3/uL — ABNORMAL HIGH (ref 4.0–10.5)

## 2020-03-24 LAB — PSA: PSA: 7.2

## 2020-03-24 LAB — VITAMIN B12: Vitamin B-12: 1120 pg/mL — ABNORMAL HIGH (ref 211–911)

## 2020-03-24 LAB — BRAIN NATRIURETIC PEPTIDE: Pro B Natriuretic peptide (BNP): 127 pg/mL — ABNORMAL HIGH (ref 0.0–100.0)

## 2020-03-24 LAB — TSH: TSH: 0.99 u[IU]/mL (ref 0.35–4.50)

## 2020-03-24 LAB — TROPONIN I (HIGH SENSITIVITY): High Sens Troponin I: 13 ng/L (ref 2–17)

## 2020-03-24 LAB — D-DIMER, QUANTITATIVE: D-Dimer, Quant: 1.44 mcg/mL FEU — ABNORMAL HIGH (ref <0.50)

## 2020-03-24 MED ORDER — NEBIVOLOL HCL 5 MG PO TABS: 5.0000 mg | ORAL_TABLET | Freq: Every day | ORAL | 0 refills | Status: DC

## 2020-03-24 NOTE — Progress Notes (Signed)
Subjective:  Patient ID: Oscar Phillips, male    DOB: [birth year]  Age: 64 y.o. MRN: [id number]  CC: Chest Pain  This visit occurred during the SARS-CoV-2 public health emergency.  Safety protocols were in place, including screening questions prior to the visit, additional usage of staff PPE, and extensive cleaning of exam room while observing appropriate contact time as indicated for disinfecting solutions.    HPI Oscar Phillips presents for the complaint of a 2 day hx of SSCP- He describes a pleuritic discomfort and a soreness under the top of his breastbone.  He says he took Motrin and got some relief.  He has chronic unchanged NP cough, shortness of breath and dyspnea on exertion.  He continues to smoke cigarettes and drink alcohol.  He complains of heartburn and complains of dysphagia with his vitamin tablet.  Outpatient Medications Prior to Visit  Medication Sig Dispense Refill  . albuterol (VENTOLIN HFA) 108 (90 Base) MCG/ACT inhaler Inhale 2 puffs into the lungs every 6 (six) hours as needed for wheezing or shortness of breath. 8.5 g 2  . cyanocobalamin 2000 MCG tablet Take 1 tablet (2,000 mcg total) by mouth daily. 90 tablet 1  . EDARBI 80 MG TABS TAKE ONE TABLET BY MOUTH DAILY.  90 tablet 1  . Multiple Vitamins-Minerals (CERTAVITE/ANTIOXIDANTS) TABS Take 1 Tablet BY MOUTH EVERY DAY for 30 days    . rosuvastatin (CRESTOR) 20 MG tablet TAKE 1 TABLET(20 MG) BY MOUTH DAILY 90 tablet 1  . TRELEGY ELLIPTA 100-62.5-25 MCG/INH AEPB INHALE 1 PUFF INTO THE LUNGS DAILY 120 each 1  . busPIRone (BUSPAR) 10 MG tablet TK 1 T PO  BID 270 tablet 1  . gabapentin (NEURONTIN) 300 MG capsule Take 1 capsule by mouth 2 (two) times daily.    . hydrOXYzine (ATARAX/VISTARIL) 25 MG tablet Take 1 Tablet BY MOUTH FOUR TIMES DAILY AS NEEDED FOR withdrawal symptoms    . mirtazapine (REMERON) 15 MG tablet Take 1 tablet (15 mg total) by mouth at bedtime. 90 tablet 1  . nebivolol (BYSTOLIC) 5 MG tablet Take 1  tablet (5 mg total) by mouth daily. 63 tablet 0  . Thiamine Mononitrate 100 MG TABS Take 1 Tablet BY MOUTH TWICE DAILY for 14 days     No facility-administered medications prior to visit.    ROS Review of Systems  Constitutional: Negative for appetite change, chills, diaphoresis, fatigue and fever.  HENT: Positive for trouble swallowing. Negative for sore throat and voice change.   Respiratory: Positive for cough and shortness of breath. Negative for wheezing and stridor.   Cardiovascular: Positive for chest pain. Negative for palpitations and leg swelling.  Gastrointestinal: Negative for abdominal pain, constipation, diarrhea, nausea and vomiting.  Genitourinary: Negative for dysuria, hematuria and urgency.  Musculoskeletal: Negative.  Negative for arthralgias, back pain and myalgias.  Skin: Negative.  Negative for color change, pallor and rash.  Neurological: Negative for dizziness, weakness, numbness and headaches.  Hematological: Negative for adenopathy. Does not bruise/bleed easily.  Psychiatric/Behavioral: Positive for decreased concentration, dysphoric mood and sleep disturbance. Negative for agitation, confusion, self-injury and suicidal ideas. The patient is nervous/anxious.     Objective:  BP (!) 180/90 (BP Location: Left Arm, Patient Position: Sitting, Cuff Size: Large)   Pulse (!) 118   Temp 98.5 F (36.9 C) (Oral)   Resp 16   Ht 5\' 8"  (1.727 m)   Wt 165 lb (74.8 kg)   SpO2 94%   BMI 25.09 kg/m   BP Readings from  Last 3 Encounters:  03/24/20 (!) 180/90  08/04/19 (!) 176/80  04/14/19 (!) 158/70    Wt Readings from Last 3 Encounters:  03/24/20 165 lb (74.8 kg)  08/04/19 165 lb (74.8 kg)  05/01/19 155 lb (70.3 kg)    Physical Exam Vitals reviewed.  Constitutional:      Appearance: He is well-developed.  HENT:     Nose: Nose normal.     Mouth/Throat:     Mouth: Mucous membranes are moist.  Eyes:     General: No scleral icterus.    Conjunctiva/sclera:  Conjunctivae normal.  Neck:     Thyroid: No thyromegaly.  Cardiovascular:     Rate and Rhythm: Tachycardia present. Frequent extrasystoles are present.    Heart sounds: No murmur heard.  No friction rub.     Comments: EKG- Sinus tachycardia with PSVT, 115 bpm LAD and RBBB (not new) No Q waves Pulmonary:     Effort: Pulmonary effort is normal.     Breath sounds: No stridor. No wheezing, rhonchi or rales.  Abdominal:     General: Abdomen is flat.     Palpations: There is no mass.     Tenderness: There is no abdominal tenderness. There is no guarding.     Hernia: No hernia is present.  Musculoskeletal:        General: No swelling.     Cervical back: Normal range of motion and neck supple.     Right lower leg: No edema.     Left lower leg: No edema.  Skin:    General: Skin is warm and dry.     Coloration: Skin is not pale.     Findings: No rash.  Neurological:     General: No focal deficit present.     Mental Status: He is alert and oriented to person, place, and time. Mental status is at baseline.  Psychiatric:        Attention and Perception: He is inattentive.        Mood and Affect: Mood is anxious. Affect is flat. Affect is not labile or blunt.        Speech: Speech normal. Speech is not delayed or tangential.        Behavior: Behavior normal. Behavior is cooperative.        Thought Content: Thought content normal. Thought content is not paranoid or delusional. Thought content does not include suicidal ideation.        Cognition and Memory: Cognition normal.        Judgment: Judgment normal.     Lab Results  Component Value Date   WBC 11.1 (H) 03/24/2020   HGB 15.8 03/24/2020   HCT 45.2 03/24/2020   PLT 277.0 03/24/2020   GLUCOSE 103 (H) 03/24/2020   CHOL 207 (H) 04/14/2019   TRIG 177.0 (H) 04/14/2019   HDL 43.90 04/14/2019   LDLDIRECT 154.1 06/03/2010   LDLCALC 128 (H) 04/14/2019   ALT 66 (H) 03/24/2020   AST 53 (H) 03/24/2020   NA 130 (L) 03/24/2020   K 3.8  03/24/2020   CL 93 (L) 03/24/2020   CREATININE 0.65 03/24/2020   BUN 6 03/24/2020   CO2 24 03/24/2020   TSH 0.99 03/24/2020   PSA 7.2 03/24/2020   INR 0.86 07/03/2018   HGBA1C 5.5 03/02/2018    DG Chest 2 View  Result Date: 02/06/2019 CLINICAL DATA:  64 year old male with fatigue EXAM: CHEST - 2 VIEW COMPARISON:  07/06/2018 FINDINGS: Cardiomediastinal silhouette unchanged. Compared to the  prior plain film there is improved aeration with resolution of the left-sided findings. Coarsened interstitial markings persist. Stigmata of emphysema, with increased retrosternal airspace, flattened hemidiaphragms, increased AP diameter, and hyperinflation on the AP view. No confluent airspace disease, pneumothorax, or pleural effusion. No displaced fracture. IMPRESSION: Emphysema without evidence of acute cardiopulmonary disease Electronically Signed   By: Corrie Mckusick D.O.   On: 02/06/2019 18:34   CT Angio Chest W/Cm &/Or Wo Cm  Result Date: 03/25/2020 CLINICAL DATA:  Chest pain.  Shortness of breath.  Positive D-dimer EXAM: CT ANGIOGRAPHY CHEST WITH CONTRAST TECHNIQUE: Multidetector CT imaging of the chest was performed using the standard protocol during bolus administration of intravenous contrast. Multiplanar CT image reconstructions and MIPs were obtained to evaluate the vascular anatomy. CONTRAST:  18mL OMNIPAQUE IOHEXOL 350 MG/ML SOLN COMPARISON:  Chest x-ray dated 03/24/2020 FINDINGS: Cardiovascular: Satisfactory opacification of the pulmonary arteries to the segmental level. No evidence of pulmonary embolism. Normal heart size. Left ventricular hypertrophy. Aortic atherosclerosis. No pericardial effusion. Mediastinum/Nodes: There is a 10 mm node at the right hilum seen on image 44 of series 4. No other adenopathy in the mediastinum or hilar regions are axillae. The thyroid gland, trachea and esophagus are normal. Lungs/Pleura: Extensive emphysematous changes both lungs primarily in the upper lobes. 3 mm  nodule in the right upper lobe on image 27 4 mm nodule in the right upper lobe on image 20 both on series 6. More other pulmonary nodules. No infiltrates or effusions. Upper Abdomen: Bilateral adrenal hyperplasia. Hepatic steatosis. 2.4 cm area of poorly defined low-density in the lateral aspect of the left lobe of the liver may represent an area of increased fatty infiltration or possibly a small hemangioma. This is a typical site for focal fatty infiltration. Musculoskeletal: No acute abnormality. Old mild anterior wedge deformities of several midthoracic vertebra. Review of the MIP images confirms the above findings. IMPRESSION: 1. No pulmonary emboli or other acute abnormalities. 2. Extensive emphysematous changes. 3. 10 mm node at the right hilum, nonspecific. 4. Two small nodules in the right upper lobe. No follow-up needed if patient is low-risk (and has no known or suspected primary neoplasm). Non-contrast chest CT can be considered in 12 months if patient is high-risk. This recommendation follows the consensus statement: Guidelines for Management of Incidental Pulmonary Nodules Detected on CT Images: From the Fleischner Society 2017; Radiology 2017; 284:228-243. 5. Hepatic steatosis. 6. Bilateral adrenal hyperplasia. 7. 2.4 cm area of poorly defined low-density in the lateral aspect of the left lobe of the liver, probably an area of increased fatty infiltration or possibly a small hemangioma. 8. Emphysema and aortic atherosclerosis. Aortic Atherosclerosis (ICD10-I70.0) and Emphysema (ICD10-J43.9). Electronically Signed   By: Lorriane Shire M.D.   On: 03/25/2020 14:24     Assessment & Plan:   Gail was seen today for chest pain.  Diagnoses and all orders for this visit:  Tachycardia- Will start nebivolol. -     EKG 12-Lead -     TSH; Future -     TSH -     nebivolol (BYSTOLIC) 5 MG tablet; Take 1 tablet (5 mg total) by mouth daily.  Pleuritic chest pain- His D-dimer is mildly elevated.  His  troponin is negative.  CT scan of the chest is negative for pulmonary embolism or aortic dissection.  I am concerned the chest pain is from esophageal causes.  I recommended that he start taking a PPI and to be seen by GI to see if he needs  to undergo upper endoscopy to screen for ulcer, stricture, or esophageal cancer. -     Brain natriuretic peptide; Future -     D-dimer, quantitative (not at [facility]); Future -     DG Chest 2 View; Future -     D-dimer, quantitative (not at [facility]) -     Brain natriuretic peptide -     CT Angio Chest W/Cm &/Or Wo Cm; Future  Coronary artery disease involving native coronary artery of native heart without angina pectoris- His chest pain does not sound like angina.  His troponin is negative.  His EKG is negative for ischemia.  We will continue to work on risk factor modifications. -     Troponin I (High Sensitivity); Future -     Troponin I (High Sensitivity)  Essential hypertension- His blood pressure is not adequately well controlled and the CT scan shows bilateral adrenal hyperplasia.  I have asked him to be compliant with the ARB.  Will restart nebivolol and will add indapamide to try to get better control of his blood pressure. -     CBC with Differential/Platelet; Future -     Basic metabolic panel; Future -     Basic metabolic panel -     CBC with Differential/Platelet -     nebivolol (BYSTOLIC) 5 MG tablet; Take 1 tablet (5 mg total) by mouth daily. -     indapamide (LOZOL) 1.25 MG tablet; Take 1 tablet (1.25 mg total) by mouth daily.  B12 deficiency -     CBC with Differential/Platelet; Future -     Folate; Future -     Vitamin B12; Future -     Vitamin B12 -     Folate -     CBC with Differential/Platelet  Chronic alcohol abuse- His LFTs remain elevated.  The CT scan shows fatty infiltration.  At this time he is not willing to abstain from alcohol intake.  I have asked him to continue taking a thiamine supplement. -     Hepatic function panel;  Future -     Hepatic function panel -     thiamine (VITAMIN B-1) 100 MG tablet; Take 1 Tablet BY MOUTH TWICE DAILY for 14 days  PSA elevation -     PSA, total and free; Future -     PSA, total and free -     Ambulatory referral to Urology  D-dimer, elevated -     CT Angio Chest W/Cm &/Or Wo Cm; Future  Pleurodynia -     CT Angio Chest W/Cm &/Or Wo Cm; Future  Gastroesophageal reflux disease with esophagitis without hemorrhage -     lansoprazole (PREVACID) 30 MG capsule; Take 1 capsule (30 mg total) by mouth 2 (two) times daily before a meal.  Adrenal hyperplasia ([place]) -     Ambulatory referral to Endocrinology  Esophageal dysphagia -     Ambulatory referral to Gastroenterology   I have discontinued Saralyn Pilar Ovitt's mirtazapine, gabapentin, hydrOXYzine, and busPIRone. I have also changed his thiamine. Additionally, I am having him start on indapamide and lansoprazole. Lastly, I am having him maintain his CertaVite/Antioxidants, cyanocobalamin, rosuvastatin, albuterol, Trelegy Ellipta, Edarbi, and nebivolol.  Meds ordered this encounter  Medications  . nebivolol (BYSTOLIC) 5 MG tablet    Sig: Take 1 tablet (5 mg total) by mouth daily.    Dispense:  90 tablet    Refill:  0  . indapamide (LOZOL) 1.25 MG tablet    Sig: Take 1 tablet (  1.25 mg total) by mouth daily.    Dispense:  90 tablet    Refill:  0  . lansoprazole (PREVACID) 30 MG capsule    Sig: Take 1 capsule (30 mg total) by mouth 2 (two) times daily before a meal.    Dispense:  180 capsule    Refill:  0  . thiamine (VITAMIN B-1) 100 MG tablet    Sig: Take 1 Tablet BY MOUTH TWICE DAILY for 14 days    Dispense:  90 tablet    Refill:  1   I spent 50 minutes in preparing to see the patient by review of recent labs, imaging and procedures, obtaining and reviewing separately obtained history, communicating with the patient and family or caregiver, ordering medications, tests or procedures, and documenting clinical  information in the EHR including the differential Dx, treatment, and any further evaluation and other management of 1. Tachycardia 2. Pleuritic chest pain 3. Coronary artery disease involving native coronary artery of native heart without angina pectoris 4. Essential hypertension 5. B12 deficiency 6. Chronic alcohol abuse 7. PSA elevation 8. D-dimer, elevated 9. Pleurodynia 10. Gastroesophageal reflux disease with esophagitis without hemorrhage 11. Adrenal hyperplasia ([place]) 12. Esophageal dysphagia     Follow-up: Return if symptoms worsen or fail to improve.  Scarlette Calico, MD

## 2020-03-24 NOTE — Patient Instructions (Signed)

## 2020-03-25 ENCOUNTER — Ambulatory Visit (INDEPENDENT_AMBULATORY_CARE_PROVIDER_SITE_OTHER)
Admission: RE | Admit: 2020-03-25 | Discharge: 2020-03-25 | Disposition: A | Discharge status: Home / Self Care | Payer: BC Managed Care – PPO | Source: Ambulatory Visit | Attending: Internal Medicine | Admitting: Internal Medicine

## 2020-03-25 ENCOUNTER — Telehealth: Payer: Self-pay | Admitting: Internal Medicine

## 2020-03-25 DIAGNOSIS — E278 Other specified disorders of adrenal gland: Secondary | ICD-10-CM | POA: Insufficient documentation

## 2020-03-25 DIAGNOSIS — R0602 Shortness of breath: Secondary | ICD-10-CM | POA: Diagnosis not present

## 2020-03-25 DIAGNOSIS — K2100 Gastro-esophageal reflux disease with esophagitis, without bleeding: Secondary | ICD-10-CM | POA: Insufficient documentation

## 2020-03-25 DIAGNOSIS — R0781 Pleurodynia: Secondary | ICD-10-CM

## 2020-03-25 DIAGNOSIS — R7989 Other specified abnormal findings of blood chemistry: Secondary | ICD-10-CM

## 2020-03-25 LAB — PSA, TOTAL AND FREE
PSA, % Free: 8 % (calc) — ABNORMAL LOW (ref >25)
PSA, Free: 0.6 ng/mL
PSA, Total: 7.2 ng/mL — ABNORMAL HIGH (ref <=4.0)

## 2020-03-25 MED ORDER — IOHEXOL 350 MG/ML SOLN
80.0000 mL | Freq: Once | INTRAVENOUS | Status: AC | PRN
  Administered 2020-03-25: 80 mL via INTRAVENOUS

## 2020-03-25 MED ORDER — LANSOPRAZOLE 30 MG PO CPDR: 30.0000 mg | DELAYED_RELEASE_CAPSULE | Freq: Two times a day (BID) | ORAL | 0 refills | Status: DC

## 2020-03-25 MED ORDER — INDAPAMIDE 1.25 MG PO TABS: 1.2500 mg | ORAL_TABLET | Freq: Every day | ORAL | 0 refills | Status: DC

## 2020-03-25 NOTE — Telephone Encounter (Signed)
New message:   Pt is calling and states he is unable to get into his MyChart. He states he knows this is how Dr. Ronnald Ramp communicates with him. Pt would like a phone call in reference to his CT Scan he had today. Please advise.

## 2020-03-26 ENCOUNTER — Encounter: Payer: Self-pay | Admitting: Gastroenterology

## 2020-03-26 MED ORDER — THIAMINE MONONITRATE 100 MG PO TABS: ORAL_TABLET | ORAL | 1 refills | Status: DC

## 2020-03-26 NOTE — Telephone Encounter (Signed)
Shouldn't need new referral. I reopened it and asked if they can call pt back.

## 2020-03-26 NOTE — Telephone Encounter (Signed)
Oscar Phillips, Can you verify if the GI referral needs to be reentered.    Pt contacted and informed of the CT results with all recommendations and suspicions to be evaluated. Pt stated understanding and will make the appointments as requested/recommended.

## 2020-03-30 ENCOUNTER — Telehealth: Payer: Self-pay

## 2020-03-30 NOTE — Telephone Encounter (Signed)
11 AM, 04/06/20

## 2020-03-30 NOTE — Telephone Encounter (Signed)
Spoke with patient to get him scheduled for new patient appointment-I was not able to get him in until 05/11/20 and patient is concerned about waiting that long-can I put patient in 11:15 slot on 04/28/20 04/29/20 or 05/04/20-please advise

## 2020-03-30 NOTE — Telephone Encounter (Signed)
Appointment switched

## 2020-04-06 ENCOUNTER — Telehealth: Payer: Self-pay

## 2020-04-06 ENCOUNTER — Ambulatory Visit: Payer: BC Managed Care – PPO | Admitting: Endocrinology

## 2020-04-06 NOTE — Telephone Encounter (Signed)
Key: [id number]

## 2020-04-11 ENCOUNTER — Other Ambulatory Visit: Payer: Self-pay | Admitting: Internal Medicine

## 2020-04-11 DIAGNOSIS — E785 Hyperlipidemia, unspecified: Secondary | ICD-10-CM

## 2020-04-11 DIAGNOSIS — I739 Peripheral vascular disease, unspecified: Secondary | ICD-10-CM

## 2020-04-11 DIAGNOSIS — I2510 Atherosclerotic heart disease of native coronary artery without angina pectoris: Secondary | ICD-10-CM

## 2020-04-15 ENCOUNTER — Other Ambulatory Visit: Payer: Self-pay

## 2020-04-15 ENCOUNTER — Ambulatory Visit: Payer: BC Managed Care – PPO | Admitting: Internal Medicine

## 2020-04-15 ENCOUNTER — Encounter: Payer: Self-pay | Admitting: Internal Medicine

## 2020-04-15 VITALS — BP 144/64 | HR 95 | Temp 98.7°F | Ht 68.0 in | Wt 165.2 lb

## 2020-04-15 DIAGNOSIS — E278 Other specified disorders of adrenal gland: Secondary | ICD-10-CM | POA: Diagnosis not present

## 2020-04-15 DIAGNOSIS — I10 Essential (primary) hypertension: Secondary | ICD-10-CM | POA: Diagnosis not present

## 2020-04-15 DIAGNOSIS — Z0000 Encounter for general adult medical examination without abnormal findings: Secondary | ICD-10-CM | POA: Diagnosis not present

## 2020-04-15 DIAGNOSIS — F1027 Alcohol dependence with alcohol-induced persisting dementia: Secondary | ICD-10-CM

## 2020-04-15 DIAGNOSIS — E785 Hyperlipidemia, unspecified: Secondary | ICD-10-CM | POA: Diagnosis not present

## 2020-04-15 DIAGNOSIS — J449 Chronic obstructive pulmonary disease, unspecified: Secondary | ICD-10-CM

## 2020-04-15 NOTE — Progress Notes (Addendum)
Subjective:  Patient ID: Oscar Phillips, male    DOB: 1956-09-20  Age: 64 y.o. MRN: [id number]  CC: Hypertension  This visit occurred during the SARS-CoV-2 public health emergency.  Safety protocols were in place, including screening questions prior to the visit, additional usage of staff PPE, and extensive cleaning of exam room while observing appropriate contact time as indicated for disinfecting solutions.    HPI Oscar Phillips presents for f/up - He continues to drink at least 6 beers a day and to smoke at least a pack of cigarettes a day.  He has had no more episodes of chest pain.  He has not seen the gastroenterologist, the endocrinologist, the urologist yet.  He has a cough that is rarely productive of yellow phlegm.  He denies fever, chills, or night sweats.  He tells me his blood pressure is improving and he is compliant with his antihypertensives.  He denies headache, blurred vision, DOE, diaphoresis, dizziness, lightheadedness, abdominal pain, loss of appetite, or weight loss.  Outpatient Medications Prior to Visit  Medication Sig Dispense Refill  . albuterol (VENTOLIN HFA) 108 (90 Base) MCG/ACT inhaler Inhale 2 puffs into the lungs every 6 (six) hours as needed for wheezing or shortness of breath. 8.5 g 2  . cyanocobalamin 2000 MCG tablet Take 1 tablet (2,000 mcg total) by mouth daily. 90 tablet 1  . EDARBI 80 MG TABS TAKE ONE TABLET BY MOUTH DAILY.  90 tablet 1  . imipramine (TOFRANIL) 50 MG tablet Take 50 mg by mouth at bedtime.    . indapamide (LOZOL) 1.25 MG tablet Take 1 tablet (1.25 mg total) by mouth daily. 90 tablet 0  . lansoprazole (PREVACID) 30 MG capsule Take 1 capsule (30 mg total) by mouth 2 (two) times daily before a meal. 180 capsule 0  . Multiple Vitamins-Minerals (CERTAVITE/ANTIOXIDANTS) TABS Take 1 Tablet BY MOUTH EVERY DAY for 30 days    . nebivolol (BYSTOLIC) 5 MG tablet Take 1 tablet (5 mg total) by mouth daily. 90 tablet 0  . rosuvastatin (CRESTOR) 20 MG  tablet TAKE 1 TABLET(20 MG) BY MOUTH DAILY 90 tablet 1  . thiamine (VITAMIN B-1) 100 MG tablet Take 1 Tablet BY MOUTH TWICE DAILY for 14 days 90 tablet 1  . TRELEGY ELLIPTA 100-62.5-25 MCG/INH AEPB INHALE 1 PUFF INTO THE LUNGS DAILY 120 each 1   No facility-administered medications prior to visit.    ROS Review of Systems  Constitutional: Negative for chills, diaphoresis, fatigue and fever.  HENT: Negative.  Negative for trouble swallowing.   Eyes: Negative.   Respiratory: Positive for cough. Negative for chest tightness, shortness of breath and wheezing.   Cardiovascular: Negative for chest pain, palpitations and leg swelling.  Gastrointestinal: Negative for abdominal pain, blood in stool, constipation, diarrhea, nausea and vomiting.  Endocrine: Negative.   Genitourinary: Negative.  Negative for difficulty urinating.  Musculoskeletal: Negative for arthralgias, back pain, myalgias and neck pain.  Skin: Negative.  Negative for color change and pallor.  Neurological: Negative.  Negative for dizziness, weakness, light-headedness and headaches.  Hematological: Negative.  Negative for adenopathy. Does not bruise/bleed easily.  Psychiatric/Behavioral: Positive for confusion and decreased concentration. Negative for agitation, behavioral problems, self-injury, sleep disturbance and suicidal ideas. The patient is nervous/anxious. The patient is not hyperactive.     Objective:  BP (!) 144/64 (BP Location: Left Arm, Patient Position: Sitting, Cuff Size: Normal)   Pulse 95   Temp 98.7 F (37.1 C) (Oral)   Ht 5\' 8"  (1.727 m)  Wt 165 lb 4 oz (75 kg)   SpO2 94%   BMI 25.13 kg/m   BP Readings from Last 3 Encounters:  04/15/20 (!) 144/64  03/24/20 (!) 180/90  08/04/19 (!) 176/80    Wt Readings from Last 3 Encounters:  04/15/20 165 lb 4 oz (75 kg)  03/24/20 165 lb (74.8 kg)  08/04/19 165 lb (74.8 kg)    Physical Exam Vitals reviewed.  HENT:     Nose: Nose normal.     Mouth/Throat:       Mouth: Mucous membranes are moist.  Eyes:     General: No scleral icterus.    Conjunctiva/sclera: Conjunctivae normal.  Cardiovascular:     Rate and Rhythm: Normal rate and regular rhythm.     Heart sounds: No murmur heard.   Pulmonary:     Effort: Pulmonary effort is normal. No tachypnea, accessory muscle usage or respiratory distress.     Breath sounds: No stridor. Wheezing present. No decreased breath sounds, rhonchi or rales.     Comments: diffuse, bilateral late exp wheezes Chest:     Chest wall: No tenderness.  Abdominal:     General: Abdomen is flat.     Palpations: There is no mass.     Tenderness: There is no abdominal tenderness. There is no guarding.     Hernia: No hernia is present.  Musculoskeletal:        General: Normal range of motion.     Cervical back: Neck supple.     Right lower leg: No edema.     Left lower leg: No edema.  Lymphadenopathy:     Cervical: No cervical adenopathy.  Skin:    General: Skin is warm and dry.     Coloration: Skin is not pale.  Neurological:     General: No focal deficit present.     Mental Status: He is alert.     Coordination: Coordination is intact.  Psychiatric:        Attention and Perception: He is inattentive.        Mood and Affect: Mood normal.        Speech: Speech is delayed and tangential.        Behavior: Behavior is slowed. Behavior is not agitated, aggressive, withdrawn or hyperactive. Behavior is cooperative.        Thought Content: Thought content normal. Thought content is not paranoid or delusional. Thought content does not include homicidal or suicidal ideation.        Cognition and Memory: Cognition is impaired. Memory is impaired. He exhibits impaired recent memory and impaired remote memory.        Judgment: Judgment normal.     Lab Results  Component Value Date   WBC 11.1 (H) 03/24/2020   HGB 15.8 03/24/2020   HCT 45.2 03/24/2020   PLT 277.0 03/24/2020   GLUCOSE 103 (H) 03/24/2020   CHOL 207  (H) 04/14/2019   TRIG 177.0 (H) 04/14/2019   HDL 43.90 04/14/2019   LDLDIRECT 154.1 06/03/2010   LDLCALC 128 (H) 04/14/2019   ALT 66 (H) 03/24/2020   AST 53 (H) 03/24/2020   NA 130 (L) 03/24/2020   K 3.8 03/24/2020   CL 93 (L) 03/24/2020   CREATININE 0.65 03/24/2020   BUN 6 03/24/2020   CO2 24 03/24/2020   TSH 0.99 03/24/2020   PSA 7.2 03/24/2020   INR 0.86 07/03/2018   HGBA1C 5.5 03/02/2018    CT Angio Chest W/Cm &/Or Wo Cm  Result Date:  03/25/2020 CLINICAL DATA:  Chest pain.  Shortness of breath.  Positive D-dimer EXAM: CT ANGIOGRAPHY CHEST WITH CONTRAST TECHNIQUE: Multidetector CT imaging of the chest was performed using the standard protocol during bolus administration of intravenous contrast. Multiplanar CT image reconstructions and MIPs were obtained to evaluate the vascular anatomy. CONTRAST:  15mL OMNIPAQUE IOHEXOL 350 MG/ML SOLN COMPARISON:  Chest x-ray dated 03/24/2020 FINDINGS: Cardiovascular: Satisfactory opacification of the pulmonary arteries to the segmental level. No evidence of pulmonary embolism. Normal heart size. Left ventricular hypertrophy. Aortic atherosclerosis. No pericardial effusion. Mediastinum/Nodes: There is a 10 mm node at the right hilum seen on image 44 of series 4. No other adenopathy in the mediastinum or hilar regions are axillae. The thyroid gland, trachea and esophagus are normal. Lungs/Pleura: Extensive emphysematous changes both lungs primarily in the upper lobes. 3 mm nodule in the right upper lobe on image 27 4 mm nodule in the right upper lobe on image 20 both on series 6. More other pulmonary nodules. No infiltrates or effusions. Upper Abdomen: Bilateral adrenal hyperplasia. Hepatic steatosis. 2.4 cm area of poorly defined low-density in the lateral aspect of the left lobe of the liver may represent an area of increased fatty infiltration or possibly a small hemangioma. This is a typical site for focal fatty infiltration. Musculoskeletal: No acute  abnormality. Old mild anterior wedge deformities of several midthoracic vertebra. Review of the MIP images confirms the above findings. IMPRESSION: 1. No pulmonary emboli or other acute abnormalities. 2. Extensive emphysematous changes. 3. 10 mm node at the right hilum, nonspecific. 4. Two small nodules in the right upper lobe. No follow-up needed if patient is low-risk (and has no known or suspected primary neoplasm). Non-contrast chest CT can be considered in 12 months if patient is high-risk. This recommendation follows the consensus statement: Guidelines for Management of Incidental Pulmonary Nodules Detected on CT Images: From the Fleischner Society 2017; Radiology 2017; 284:228-243. 5. Hepatic steatosis. 6. Bilateral adrenal hyperplasia. 7. 2.4 cm area of poorly defined low-density in the lateral aspect of the left lobe of the liver, probably an area of increased fatty infiltration or possibly a small hemangioma. 8. Emphysema and aortic atherosclerosis. Aortic Atherosclerosis (ICD10-I70.0) and Emphysema (ICD10-J43.9). Electronically Signed   By: Lorriane Shire M.D.   On: 03/25/2020 14:24    Assessment & Plan:   Coreon was seen today for hypertension.  Diagnoses and all orders for this visit:  Adrenal hyperplasia ([place])- I will screen him for hyperaldosteronism, hypercortisolism, and pheochromocytoma. -     Aldosterone + renin activity w/ ratio; Future -     Cortisol; Future -     Metanephrines, plasma; Future -     Basic metabolic panel; Future -     Basic metabolic panel -     Metanephrines, plasma -     Cortisol -     Aldosterone + renin activity w/ ratio  Essential hypertension- His blood pressure has improved.  I will monitor his electrolytes and renal function. -     Basic metabolic panel; Future -     Basic metabolic panel  Hyperlipidemia with target LDL less than - He is doing well on the statin.  I will check a lipid panel to see if he has achieved his LDL goal.  Routine  general medical examination at a health care facility- Exam completed, labs ordered, he has been referred for colon cancer screening, vaccines reviewed and updated, patient education material was given. 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  He has entered rehab previously and is not willing to enter rehab again at this time.  He will let me know if he changes his mind.  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 exacerbation ([place])- He was encouraged to quit smoking.  Will continue the triple inhaler therapy with Trelegy.   I am having Bettey Costa maintain his CertaVite/Antioxidants, cyanocobalamin, albuterol, Trelegy Ellipta, Edarbi, nebivolol, indapamide, lansoprazole, thiamine, rosuvastatin, and imipramine.  No orders of the defined types were placed in this encounter.  In addition to time spent on CPE, I spent 50 minutes in preparing to see the patient by review of recent labs, imaging and procedures, obtaining and reviewing separately obtained history, communicating with the patient and family or caregiver, ordering medications, tests or procedures, and documenting clinical information in the EHR including the differential Dx, treatment, and any further evaluation and other management of 1. Adrenal hyperplasia ([place]) 2. Essential hypertension 3. Hyperlipidemia with target LDL less than 100 4. 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 exacerbation ([place])     Follow-up: No follow-ups on file.  Scarlette Calico, MD

## 2020-04-18 ENCOUNTER — Encounter: Payer: Self-pay | Admitting: Internal Medicine

## 2020-04-18 NOTE — Patient Instructions (Signed)

## 2020-04-21 LAB — BASIC METABOLIC PANEL
BUN: 11 mg/dL (ref 7–25)
CO2: 29 mmol/L (ref 20–32)
Calcium: 9.7 mg/dL (ref 8.6–10.3)
Chloride: 88 mmol/L — ABNORMAL LOW (ref 98–110)
Creat: 0.74 mg/dL (ref 0.70–1.25)
Glucose, Bld: 103 mg/dL — ABNORMAL HIGH (ref 65–99)
Potassium: 3.9 mmol/L (ref 3.5–5.3)
Sodium: 125 mmol/L — ABNORMAL LOW (ref 135–146)

## 2020-04-21 LAB — METANEPHRINES, PLASMA
Metanephrine, Free: 78 pg/mL — ABNORMAL HIGH (ref <=57)
Normetanephrine, Free: 265 pg/mL — ABNORMAL HIGH (ref <=148)
Total Metanephrines-Plasma: 343 pg/mL — ABNORMAL HIGH (ref <=205)

## 2020-04-21 LAB — LIPID PANEL
Cholesterol: 137 mg/dL (ref <200)
HDL: 51 mg/dL (ref >=40)
LDL Cholesterol (Calc): 65 mg/dL (calc)
Non-HDL Cholesterol (Calc): 86 mg/dL (calc) (ref <130)
Total CHOL/HDL Ratio: 2.7 (calc) (ref <5.0)
Triglycerides: 124 mg/dL (ref <150)

## 2020-04-21 LAB — ALDOSTERONE + RENIN ACTIVITY W/ RATIO
ALDO / PRA Ratio: 2.3 Ratio (ref 0.9–28.9)
Aldosterone: 5 ng/dL
Renin Activity: 2.18 ng/mL/h (ref 0.25–5.82)

## 2020-04-21 LAB — CORTISOL: Cortisol, Plasma: 12.1 ug/dL

## 2020-04-23 ENCOUNTER — Other Ambulatory Visit: Payer: Self-pay

## 2020-04-23 ENCOUNTER — Ambulatory Visit (INDEPENDENT_AMBULATORY_CARE_PROVIDER_SITE_OTHER): Payer: BC Managed Care – PPO | Admitting: Endocrinology

## 2020-04-23 ENCOUNTER — Encounter: Payer: Self-pay | Admitting: Endocrinology

## 2020-04-23 VITALS — BP 146/72 | HR 111 | Ht 68.0 in | Wt 164.1 lb

## 2020-04-23 DIAGNOSIS — E278 Other specified disorders of adrenal gland: Secondary | ICD-10-CM

## 2020-04-23 LAB — CORTISOL
Cortisol, Plasma: 13.2 ug/dL
Cortisol, Plasma: 42.4 ug/dL

## 2020-04-23 MED ORDER — COSYNTROPIN 0.25 MG IJ SOLR
0.2500 mg | Freq: Once | INTRAMUSCULAR | Status: AC
  Administered 2020-04-23: 0.25 mg via INTRAMUSCULAR

## 2020-04-23 NOTE — Progress Notes (Signed)
Subjective:    Patient ID: Oscar Phillips, male    DOB: 30-Sep-1956, 64 y.o.   MRN: [id number]  HPI Pt is ref by Dr Ronnald Ramp, for adrenal hyperplasia.  Pt says he was the product of normal delivery and neonatal period.  He has no h/o adrenal disease.  He takes no steroids, except for inhaler.  Main symptoms are fatigue and anxiety.  He has no children.  No h/o infertility.   Past Medical History:  Diagnosis Date  . Abdominal bruit   . Alcoholism ([place])   . Anxiety   . B12 deficiency   . Bronchitis   . COPD (chronic obstructive pulmonary disease) ([place])   . GERD (gastroesophageal reflux disease)   . H/O trichomonal urethritis   . History of panic attacks   . History of rectal bleeding    in August 2016  . HLD (hyperlipidemia)   . HTN (hypertension)   . OCD (obsessive compulsive disorder)   . PSA elevation     Past Surgical History:  Procedure Laterality Date  . ELBOW FRACTURE SURGERY Left   . LEFT HEART CATH AND CORONARY ANGIOGRAPHY N/A 07/04/2018   Procedure: LEFT HEART CATH AND CORONARY ANGIOGRAPHY;  Surgeon: Belva Crome, MD;  Location: [facility];  Service: Cardiovascular;  Laterality: N/A;  . LUMBAR [place]    . TONSILLECTOMY  1965    Social History   Socioeconomic History  . Marital status: Single    Spouse name: Not on file  . Number of children: 0  . Years of education: Not on file  . Highest education level: Not on file  Occupational History  . Occupation: Web designer, Retired    Fish farm manager: [employer]  Tobacco Use  . Smoking status: Current Every Day Smoker    Packs/day: 1.00    Years: 25.00    Pack years: 25.00    Types: Cigarettes  . Smokeless tobacco: Never Used  Substance and Sexual Activity  . Alcohol use: Yes    Alcohol/week: 56.0 standard drinks    Types: 56 Glasses of wine per week  . Drug use: No  . Sexual activity: Not Currently  Other Topics Concern  . Not on file  Social History Narrative   Single.  Lives  alone.    Social Determinants of Health   Financial Resource Strain:   . Difficulty of Paying Living Expenses:   Food Insecurity:   . Worried About Charity fundraiser in the Last Year:   . Arboriculturist in the Last Year:   Transportation Needs:   . Film/video editor (Medical):   Marland Kitchen Lack of Transportation (Non-Medical):   Physical Activity:   . Days of Exercise per Week:   . Minutes of Exercise per Session:   Stress:   . Feeling of Stress :   Social Connections:   . Frequency of Communication with Friends and Family:   . Frequency of Social Gatherings with Friends and Family:   . Attends Religious Services:   . Active Member of Clubs or Organizations:   . Attends Archivist Meetings:   Marland Kitchen Marital Status:   Intimate Partner Violence:   . Fear of Current or Ex-Partner:   . Emotionally Abused:   Marland Kitchen Physically Abused:   . Sexually Abused:     Current Outpatient Medications on File Prior to Visit  Medication Sig Dispense Refill  . albuterol (VENTOLIN HFA) 108 (90 Base) MCG/ACT inhaler Inhale 2 puffs into  the lungs every 6 (six) hours as needed for wheezing or shortness of breath. 8.5 g 2  . cyanocobalamin 2000 MCG tablet Take 1 tablet (2,000 mcg total) by mouth daily. 90 tablet 1  . EDARBI 80 MG TABS TAKE ONE TABLET BY MOUTH DAILY.  90 tablet 1  . imipramine (TOFRANIL) 50 MG tablet Take 50 mg by mouth at bedtime.    . indapamide (LOZOL) 1.25 MG tablet Take 1 tablet (1.25 mg total) by mouth daily. 90 tablet 0  . lansoprazole (PREVACID) 30 MG capsule Take 1 capsule (30 mg total) by mouth 2 (two) times daily before a meal. 180 capsule 0  . Multiple Vitamins-Minerals (CERTAVITE/ANTIOXIDANTS) TABS Take 1 Tablet BY MOUTH EVERY DAY for 30 days    . nebivolol (BYSTOLIC) 5 MG tablet Take 1 tablet (5 mg total) by mouth daily. 90 tablet 0  . rosuvastatin (CRESTOR) 20 MG tablet TAKE 1 TABLET(20 MG) BY MOUTH DAILY 90 tablet 1  . thiamine (VITAMIN B-1) 100 MG tablet Take 1 Tablet  BY MOUTH TWICE DAILY for 14 days 90 tablet 1  . TRELEGY ELLIPTA 100-62.5-25 MCG/INH AEPB INHALE 1 PUFF INTO THE LUNGS DAILY 120 each 1  . [DISCONTINUED] albuterol (VENTOLIN HFA) 108 (90 Base) MCG/ACT inhaler Inhale 2 puffs into the lungs every 6 (six) hours as needed for wheezing or shortness of breath. 18 g 3  . [DISCONTINUED] rosuvastatin (CRESTOR) 20 MG tablet TAKE 1 TABLET(20 MG) BY MOUTH DAILY 90 tablet 1   No current facility-administered medications on file prior to visit.    Allergies  Allergen Reactions  . Lisinopril Cough  . Penicillins Rash    Has patient had a PCN reaction causing immediate rash, facial/tongue/throat swelling, SOB or lightheadedness with hypotension: Yes Has patient had a PCN reaction causing severe rash involving mucus membranes or skin necrosis: No Has patient had a PCN reaction that required hospitalization: Unknown Has patient had a PCN reaction occurring within the last 10 years: Unknown If all of the above answers are "NO", then may proceed with Cephalosporin use.     Family History  Problem Relation Age of Onset  . Hyperlipidemia Father   . Colon cancer Father   . Stomach cancer Father   . Dementia Mother   . Alcohol abuse Maternal Uncle   . Lung cancer Maternal Uncle   . Heart failure Paternal Grandmother   . Emphysema Paternal Grandmother   . Diabetes Neg Hx   . Drug abuse Neg Hx   . Early death Neg Hx   . Heart disease Neg Hx   . Hypertension Neg Hx   . Kidney disease Neg Hx   . Stroke Neg Hx   . Adrenal disorder Neg Hx     BP (!) 146/72 (BP Location: Left Arm, Patient Position: Sitting, Cuff Size: Normal)   Pulse (!) 111   Ht 5\' 8"  (1.727 m)   Wt 164 lb 2 oz (74.4 kg)   SpO2 97%   BMI 24.96 kg/m    Review of Systems Denies fever, weight loss, dizziness, blurry vision, n/v, abd pain, and change in skin tone.      Objective:   Physical Exam VS: see vs page GEN: no distress HEAD: head: no deformity eyes: no periorbital  swelling, no proptosis external nose and ears are normal NECK: supple, thyroid is not enlarged CHEST WALL: no deformity LUNGS: clear to auscultation BREASTS:  No gynecomastia CV: reg rate and rhythm, no murmur GENITALIA:  Normal male, except testicles are  slightly small and soft.  MUSCULOSKELETAL: muscle bulk and strength are grossly normal.  no joint swelling is seen.  gait is normal and steady.   EXTEMITIES: no leg edema PULSES: no carotid bruit. NEURO:  cn 2-12 grossly intact.  sensation is intact to touch on all 4's SKIN:  Normal texture and temperature.  No rash or suspicious lesion is visible.  Normal male hair distribution.   NODES:  None palpable at the neck PSYCH: alert, well-oriented.  Does not appear anxious nor depressed.     I have reviewed outside records, and summarized:  Pt was noted to have elevated A1c, and referred here.  He had chest CT for unrelated reason, and bilat adrenal hyperplasia was incidentally noted.    ACTH stimulation test is done: baseline cortisol level=13 then Cosyntropin 250 mcg is given im 45 minutes later, cortisol level=42 (normal response)     Assessment & Plan:  Adrenal hyperplasia, new to me, uncertain etiology.  Check 17-OHP before and after cosyntropin.    Patient Instructions  Blood tests are requested for you today.  We'll let you know about the results.

## 2020-04-23 NOTE — Progress Notes (Signed)
Per orders of Dr. Loanne Drilling injection of Cortrosyn given today by [facility] Medical Assistant. Patient tolerated injection well.

## 2020-04-23 NOTE — Patient Instructions (Signed)
Blood tests are requested for you today.  We'll let you know about the results.  

## 2020-05-01 LAB — CATECHOLAMINES, FRACTIONATED, PLASMA
Dopamine: 85 pg/mL — ABNORMAL HIGH
Epinephrine: 115 pg/mL — ABNORMAL HIGH
Norepinephrine: 896 pg/mL
Total Catecholamines: 1096 pg/mL

## 2020-05-01 LAB — EXTRA SPECIMEN

## 2020-05-01 LAB — ACTH: C206 ACTH: 13 pg/mL (ref 6–50)

## 2020-05-01 LAB — ALDOSTERONE + RENIN ACTIVITY W/ RATIO
ALDO / PRA Ratio: 1 Ratio (ref 0.9–28.9)
Aldosterone: 3 ng/dL
Renin Activity: 2.86 ng/mL/h (ref 0.25–5.82)

## 2020-05-01 LAB — METANEPHRINES, PLASMA
Metanephrine, Free: 60 pg/mL — ABNORMAL HIGH (ref <=57)
Normetanephrine, Free: 153 pg/mL — ABNORMAL HIGH (ref <=148)
Total Metanephrines-Plasma: 213 pg/mL — ABNORMAL HIGH (ref <=205)

## 2020-05-01 LAB — 17-HYDROXYPROGESTERONE: 17-OH-Progesterone, LC/MS/MS: 88 ng/dL

## 2020-05-04 ENCOUNTER — Telehealth: Payer: Self-pay

## 2020-05-04 NOTE — Telephone Encounter (Signed)
-----   Message from Renato Shin, MD sent at 05/04/2020  4:49 PM EDT ----- please contact lab: The second 17-Hydroxyprogesterone level result is not resulted.  When will it be?

## 2020-05-04 NOTE — Telephone Encounter (Signed)
Routing this message to phlebotomist for follow up on status of lab results.

## 2020-05-05 ENCOUNTER — Other Ambulatory Visit: Payer: Self-pay

## 2020-05-11 ENCOUNTER — Ambulatory Visit: Payer: BC Managed Care – PPO | Admitting: Endocrinology

## 2020-05-13 ENCOUNTER — Ambulatory Visit: Payer: BC Managed Care – PPO | Admitting: Gastroenterology

## 2020-05-13 DIAGNOSIS — F410 Panic disorder [episodic paroxysmal anxiety] without agoraphobia: Secondary | ICD-10-CM | POA: Diagnosis not present

## 2020-05-14 ENCOUNTER — Ambulatory Visit: Payer: BC Managed Care – PPO | Admitting: Endocrinology

## 2020-05-17 ENCOUNTER — Other Ambulatory Visit: Payer: Self-pay | Admitting: Internal Medicine

## 2020-05-17 DIAGNOSIS — J449 Chronic obstructive pulmonary disease, unspecified: Secondary | ICD-10-CM

## 2020-06-22 ENCOUNTER — Other Ambulatory Visit: Payer: Self-pay | Admitting: Internal Medicine

## 2020-06-22 ENCOUNTER — Telehealth: Payer: Self-pay | Admitting: Internal Medicine

## 2020-06-22 DIAGNOSIS — I10 Essential (primary) hypertension: Secondary | ICD-10-CM

## 2020-06-22 DIAGNOSIS — K2100 Gastro-esophageal reflux disease with esophagitis, without bleeding: Secondary | ICD-10-CM

## 2020-06-22 NOTE — Telephone Encounter (Signed)
Short term memory issue, confusion, Therapist ruled out possibility of a missed dose of morning meds  Only happened one other time with no adverse effects  Patient would like a call

## 2020-06-23 NOTE — Telephone Encounter (Signed)
Noted  
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
## 2020-06-24 ENCOUNTER — Telehealth: Payer: Self-pay | Admitting: Internal Medicine
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
## 2020-07-07 ENCOUNTER — Ambulatory Visit (INDEPENDENT_AMBULATORY_CARE_PROVIDER_SITE_OTHER): Payer: BC Managed Care – PPO | Admitting: Internal Medicine

## 2020-07-07 ENCOUNTER — Other Ambulatory Visit: Payer: Self-pay

## 2020-07-07 ENCOUNTER — Encounter: Payer: Self-pay | Admitting: Internal Medicine

## 2020-07-07 VITALS — BP 136/76 | HR 100 | Temp 97.8°F | Resp 20 | Ht 68.0 in | Wt 160.0 lb

## 2020-07-07 DIAGNOSIS — Z23 Encounter for immunization: Secondary | ICD-10-CM

## 2020-07-07 DIAGNOSIS — R972 Elevated prostate specific antigen [PSA]: Secondary | ICD-10-CM | POA: Diagnosis not present

## 2020-07-07 DIAGNOSIS — H538 Other visual disturbances: Secondary | ICD-10-CM

## 2020-07-07 DIAGNOSIS — J449 Chronic obstructive pulmonary disease, unspecified: Secondary | ICD-10-CM | POA: Diagnosis not present

## 2020-07-07 DIAGNOSIS — K7010 Alcoholic hepatitis without ascites: Secondary | ICD-10-CM

## 2020-07-07 DIAGNOSIS — I10 Essential (primary) hypertension: Secondary | ICD-10-CM | POA: Diagnosis not present

## 2020-07-07 DIAGNOSIS — E278 Other specified disorders of adrenal gland: Secondary | ICD-10-CM | POA: Diagnosis not present

## 2020-07-07 DIAGNOSIS — E538 Deficiency of other specified B group vitamins: Secondary | ICD-10-CM

## 2020-07-07 DIAGNOSIS — Z1211 Encounter for screening for malignant neoplasm of colon: Secondary | ICD-10-CM

## 2020-07-07 DIAGNOSIS — L989 Disorder of the skin and subcutaneous tissue, unspecified: Secondary | ICD-10-CM

## 2020-07-07 DIAGNOSIS — F1096 Alcohol use, unspecified with alcohol-induced persisting amnestic disorder: Secondary | ICD-10-CM

## 2020-07-07 NOTE — Progress Notes (Addendum)
Subjective:  Patient ID: Oscar Phillips, male    DOB: 01-Dec-1955  Age: 64 y.o. MRN: [id number]  CC: COPD and Hypertension  This visit occurred during the SARS-CoV-2 public health emergency.  Safety protocols were in place, including screening questions prior to the visit, additional usage of staff PPE, and extensive cleaning of exam room while observing appropriate contact time as indicated for disinfecting solutions.    HPI [name] presents for f/up - He tells me that he is only drinking 4 beers/day and 1 cigarette/hour.  He asked me if naltrexone would be a good option for him.  His brother is with him today and wants me to order an ACAPELLA device. He continues to cough with clear phlegm. The patient and his brother have noticed worsening cognitive decline with episodes of confusion and forgetfulness. He also complains of blurred vision.  Outpatient Medications Prior to Visit  Medication Sig Dispense Refill  . albuterol (VENTOLIN HFA) 108 (90 Base) MCG/ACT inhaler Inhale 2 puffs into the lungs every 6 (six) hours as needed for wheezing or shortness of breath. 8.5 g 2  . cyanocobalamin 2000 MCG tablet Take 1 tablet (2,000 mcg total) by mouth daily. 90 tablet 1  . EDARBI 80 MG TABS TAKE ONE TABLET BY MOUTH DAILY.  90 tablet 1  . imipramine (TOFRANIL) 50 MG tablet Take 50 mg by mouth at bedtime.    . indapamide (LOZOL) 1.25 MG tablet TAKE 1 TABLET(1.25 MG) BY MOUTH DAILY 90 tablet 0  . lansoprazole (PREVACID) 30 MG capsule TAKE 1 CAPSULE(30 MG) BY MOUTH TWICE DAILY BEFORE A MEAL 180 capsule 0  . Multiple Vitamins-Minerals (CERTAVITE/ANTIOXIDANTS) TABS Take 1 Tablet BY MOUTH EVERY DAY for 30 days    . nebivolol (BYSTOLIC) 5 MG tablet Take 1 tablet (5 mg total) by mouth daily. 90 tablet 0  . thiamine (VITAMIN B-1) 100 MG tablet Take 1 Tablet BY MOUTH TWICE DAILY for 14 days 90 tablet 1  . TRELEGY ELLIPTA 100-62.5-25 MCG/INH AEPB INHALE 1 PUFF INTO THE LUNGS DAILY 120 each 1  .  rosuvastatin (CRESTOR) 20 MG tablet TAKE 1 TABLET(20 MG) BY MOUTH DAILY 90 tablet 1   No facility-administered medications prior to visit.    ROS Review of Systems  Constitutional: Negative for appetite change, chills, diaphoresis, fatigue and fever.  HENT: Negative for sore throat and trouble swallowing.   Respiratory: Positive for cough and shortness of breath. Negative for chest tightness.   Cardiovascular: Negative for chest pain, palpitations and leg swelling.  Gastrointestinal: Negative for abdominal pain, constipation, diarrhea, nausea and vomiting.  Genitourinary: Negative.  Negative for difficulty urinating, dysuria and hematuria.  Musculoskeletal: Negative.  Negative for arthralgias and myalgias.  Skin: Negative.  Negative for color change and rash.       For several months he has noticed a lesion on the right lower side of his face.  Neurological: Negative.  Negative for dizziness, weakness, light-headedness and headaches.  Hematological: Negative for adenopathy. Does not bruise/bleed easily.  Psychiatric/Behavioral: Positive for confusion, decreased concentration and sleep disturbance. Negative for agitation, behavioral problems, hallucinations, self-injury and suicidal ideas. The patient is not nervous/anxious.     Objective:  BP 136/76   Pulse 100   Temp 97.8 F (36.6 C) (Oral)   Resp 20   Ht 5\' 8"  (1.727 m)   Wt 160 lb (72.6 kg)   SpO2 96%   BMI 24.33 kg/m   BP Readings from Last 3 Encounters:  07/07/20 136/76  04/23/20 Marland Kitchen)  146/72  04/15/20 (!) 144/64    Wt Readings from Last 3 Encounters:  07/07/20 160 lb (72.6 kg)  04/23/20 164 lb 2 oz (74.4 kg)  04/15/20 165 lb 4 oz (75 kg)    Physical Exam Vitals reviewed.  HENT:     Head:      Nose: Nose normal.     Mouth/Throat:     Mouth: Mucous membranes are moist.  Eyes:     General: No scleral icterus.    Conjunctiva/sclera: Conjunctivae normal.  Cardiovascular:     Rate and Rhythm: Normal rate and  regular rhythm.     Heart sounds: No murmur heard.   Pulmonary:     Effort: Pulmonary effort is normal.     Breath sounds: No stridor. No wheezing, rhonchi or rales.  Abdominal:     General: There is distension.     Palpations: Abdomen is soft. There is no mass.     Tenderness: There is no abdominal tenderness. There is no guarding.  Musculoskeletal:        General: Normal range of motion.     Cervical back: Neck supple.     Right lower leg: No edema.     Left lower leg: No edema.  Lymphadenopathy:     Cervical: No cervical adenopathy.  Skin:    General: Skin is warm and dry.     Coloration: Skin is not jaundiced or pale.     Findings: No erythema.  Neurological:     General: No focal deficit present.     Mental Status: He is alert and oriented to person, place, and time.  Psychiatric:        Attention and Perception: He is inattentive.        Mood and Affect: Mood is anxious. Mood is not depressed or elated. Affect is flat. Affect is not labile, blunt, angry or tearful.        Speech: He is communicative. Speech is delayed and tangential. Speech is not rapid and pressured or slurred.        Behavior: Behavior is slowed. Behavior is not agitated, withdrawn, hyperactive or combative. Behavior is cooperative.        Thought Content: Thought content normal. Thought content is not paranoid or delusional. Thought content does not include homicidal or suicidal ideation.        Cognition and Memory: Cognition is impaired. Memory is impaired.     Lab Results  Component Value Date   WBC 6.9 07/07/2020   HGB 13.3 07/07/2020   HCT 39.1 07/07/2020   PLT 411 (H) 07/07/2020   GLUCOSE 93 07/07/2020   CHOL 137 04/15/2020   TRIG 124 04/15/2020   HDL 51 04/15/2020   LDLDIRECT 154.1 06/03/2010   LDLCALC 65 04/15/2020   ALT 24 07/07/2020   AST 24 07/07/2020   NA 133 (L) 07/07/2020   K 4.5 07/07/2020   CL 93 (L) 07/07/2020   CREATININE 1.09 07/07/2020   BUN 16 07/07/2020   CO2 29  07/07/2020   TSH 0.99 03/24/2020   PSA 10.37 (H) 07/07/2020   INR 1.0 07/07/2020   HGBA1C 5.5 03/02/2018    CT Angio Chest W/Cm &/Or Wo Cm  Result Date: 03/25/2020 CLINICAL DATA:  Chest pain.  Shortness of breath.  Positive D-dimer EXAM: CT ANGIOGRAPHY CHEST WITH CONTRAST TECHNIQUE: Multidetector CT imaging of the chest was performed using the standard protocol during bolus administration of intravenous contrast. Multiplanar CT image reconstructions and MIPs were obtained to evaluate  the vascular anatomy. CONTRAST:  14mL OMNIPAQUE IOHEXOL 350 MG/ML SOLN COMPARISON:  Chest x-ray dated 03/24/2020 FINDINGS: Cardiovascular: Satisfactory opacification of the pulmonary arteries to the segmental level. No evidence of pulmonary embolism. Normal heart size. Left ventricular hypertrophy. Aortic atherosclerosis. No pericardial effusion. Mediastinum/Nodes: There is a 10 mm node at the right hilum seen on image 44 of series 4. No other adenopathy in the mediastinum or hilar regions are axillae. The thyroid gland, trachea and esophagus are normal. Lungs/Pleura: Extensive emphysematous changes both lungs primarily in the upper lobes. 3 mm nodule in the right upper lobe on image 27 4 mm nodule in the right upper lobe on image 20 both on series 6. More other pulmonary nodules. No infiltrates or effusions. Upper Abdomen: Bilateral adrenal hyperplasia. Hepatic steatosis. 2.4 cm area of poorly defined low-density in the lateral aspect of the left lobe of the liver may represent an area of increased fatty infiltration or possibly a small hemangioma. This is a typical site for focal fatty infiltration. Musculoskeletal: No acute abnormality. Old mild anterior wedge deformities of several midthoracic vertebra. Review of the MIP images confirms the above findings. IMPRESSION: 1. No pulmonary emboli or other acute abnormalities. 2. Extensive emphysematous changes. 3. 10 mm node at the right hilum, nonspecific. 4. Two small nodules  in the right upper lobe. No follow-up needed if patient is low-risk (and has no known or suspected primary neoplasm). Non-contrast chest CT can be considered in 12 months if patient is high-risk. This recommendation follows the consensus statement: Guidelines for Management of Incidental Pulmonary Nodules Detected on CT Images: From the Fleischner Society 2017; Radiology 2017; 284:228-243. 5. Hepatic steatosis. 6. Bilateral adrenal hyperplasia. 7. 2.4 cm area of poorly defined low-density in the lateral aspect of the left lobe of the liver, probably an area of increased fatty infiltration or possibly a small hemangioma. 8. Emphysema and aortic atherosclerosis. Aortic Atherosclerosis (ICD10-I70.0) and Emphysema (ICD10-J43.9). Electronically Signed   By: Lorriane Shire M.D.   On: 03/25/2020 14:24    Assessment & Plan:   Terre was seen today for copd and hypertension.  Diagnoses and all orders for this visit:  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 exacerbation ([place])- He will use the Acapella device as requested.  I have asked him to quit smoking.  He will continue to use the triple inhaler. -     For home use only DME Other see comment  Essential hypertension- His blood pressure is adequately well controlled. -     CBC with Differential/Platelet; Future -     CBC with Differential/Platelet  B12 deficiency- His H&H are normal now. -     CBC with Differential/Platelet; Future -     Vitamin B12; Future -     Folate; Future -     Folate -     Vitamin B12 -     CBC with Differential/Platelet  PSA elevation- His PSA his continues to rise and now is above 10.  He has not seen urology yet.  I have encouraged him to see a urologist to consider screening for prostate cancer. -     PSA; Future -     PSA -     Ambulatory referral to Urology  Adrenal hyperplasia ([place])- His cortisol level is low normal now that he has significantly decreased his alcohol intake.  This is likely related to his degree of  alcohol use. -     BASIC METABOLIC PANEL WITH GFR; Future -     Cortisol; Future -  Cortisol -     BASIC METABOLIC PANEL WITH GFR  Alcoholic hepatitis without ascites- His LFTs have normalized since he decreased his alcohol intake.  I have advised him that I only used naltrexone in someone who is interested in an abstinence program which he says he is not.  He tells me he is comfortable drinking about 4 beers a day. 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  Skin lesion of face- This looks like BCC. -     Ambulatory referral to Dermatology  Colon cancer screening -     Cologuard   I have discontinued Shannan Long's rosuvastatin. I am also having him maintain his CertaVite/Antioxidants, cyanocobalamin, albuterol, Edarbi, nebivolol, thiamine, imipramine, Trelegy Ellipta, indapamide, and lansoprazole.  No orders of the defined types were placed in this encounter.  I spent 50 minutes in preparing to see the patient by review of recent labs, imaging and procedures, obtaining and reviewing separately obtained history, communicating with the patient and family or caregiver, ordering medications, tests or procedures, and documenting clinical information in the EHR including the differential Dx, treatment, and any further evaluation and other management of 1. 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 exacerbation ([place]) 2. Essential hypertension 3. B12 deficiency 4. PSA elevation 5. Adrenal hyperplasia ([place]) 6. Alcoholic hepatitis without ascites 7. Blurred vision, bilateral 8. Korsakoff's psychosis, alcohol  related ([place]) 9. Skin lesion of face     Follow-up: Return in about 3 months (around 10/06/2020).  Scarlette Calico, MD

## 2020-07-07 NOTE — Patient Instructions (Signed)

## 2020-07-08 ENCOUNTER — Ambulatory Visit (INDEPENDENT_AMBULATORY_CARE_PROVIDER_SITE_OTHER): Payer: BC Managed Care – PPO | Admitting: Dermatology

## 2020-07-08 ENCOUNTER — Encounter: Payer: Self-pay | Admitting: Dermatology

## 2020-07-08 ENCOUNTER — Encounter: Payer: Self-pay | Admitting: Internal Medicine

## 2020-07-08 DIAGNOSIS — L570 Actinic keratosis: Secondary | ICD-10-CM | POA: Diagnosis not present

## 2020-07-08 DIAGNOSIS — D485 Neoplasm of uncertain behavior of skin: Secondary | ICD-10-CM

## 2020-07-08 DIAGNOSIS — Z85828 Personal history of other malignant neoplasm of skin: Secondary | ICD-10-CM

## 2020-07-08 DIAGNOSIS — L989 Disorder of the skin and subcutaneous tissue, unspecified: Secondary | ICD-10-CM | POA: Insufficient documentation

## 2020-07-08 DIAGNOSIS — Z23 Encounter for immunization: Secondary | ICD-10-CM | POA: Insufficient documentation

## 2020-07-08 DIAGNOSIS — H538 Other visual disturbances: Secondary | ICD-10-CM | POA: Insufficient documentation

## 2020-07-08 DIAGNOSIS — K7010 Alcoholic hepatitis without ascites: Secondary | ICD-10-CM | POA: Insufficient documentation

## 2020-07-08 LAB — CBC WITH DIFFERENTIAL/PLATELET
Absolute Monocytes: 559 cells/uL (ref 200–950)
Basophils Absolute: 41 cells/uL (ref 0–200)
Basophils Relative: 0.6 %
Eosinophils Absolute: 104 cells/uL (ref 15–500)
Eosinophils Relative: 1.5 %
HCT: 39.1 % (ref 38.5–50.0)
Hemoglobin: 13.3 g/dL (ref 13.2–17.1)
Lymphs Abs: 2029 cells/uL (ref 850–3900)
MCH: 31.5 pg (ref 27.0–33.0)
MCHC: 34 g/dL (ref 32.0–36.0)
MCV: 92.7 fL (ref 80.0–100.0)
MPV: 9.3 fL (ref 7.5–12.5)
Monocytes Relative: 8.1 %
Neutro Abs: 4168 cells/uL (ref 1500–7800)
Neutrophils Relative %: 60.4 %
Platelets: 411 10*3/uL — ABNORMAL HIGH (ref 140–400)
RBC: 4.22 10*6/uL (ref 4.20–5.80)
RDW: 13.1 % (ref 11.0–15.0)
Total Lymphocyte: 29.4 %
WBC: 6.9 10*3/uL (ref 3.8–10.8)

## 2020-07-08 LAB — PSA: PSA: 10.37 ng/mL — ABNORMAL HIGH (ref <=4.0)

## 2020-07-08 LAB — CORTISOL: Cortisol, Plasma: 5.6 ug/dL

## 2020-07-08 LAB — BASIC METABOLIC PANEL WITH GFR
BUN: 16 mg/dL (ref 7–25)
CO2: 29 mmol/L (ref 20–32)
Calcium: 10.2 mg/dL (ref 8.6–10.3)
Chloride: 93 mmol/L — ABNORMAL LOW (ref 98–110)
Creat: 1.09 mg/dL (ref 0.70–1.25)
GFR, Est African American: 83 mL/min/{1.73_m2} (ref >=60)
GFR, Est Non African American: 71 mL/min/{1.73_m2} (ref >=60)
Glucose, Bld: 93 mg/dL (ref 65–99)
Potassium: 4.5 mmol/L (ref 3.5–5.3)
Sodium: 133 mmol/L — ABNORMAL LOW (ref 135–146)

## 2020-07-08 LAB — PROTIME-INR
INR: 1
Prothrombin Time: 10.8 s (ref 9.0–11.5)

## 2020-07-08 LAB — HEPATIC FUNCTION PANEL
AG Ratio: 1.5 (calc) (ref 1.0–2.5)
ALT: 24 U/L (ref 9–46)
AST: 24 U/L (ref 10–35)
Albumin: 4.4 g/dL (ref 3.6–5.1)
Alkaline phosphatase (APISO): 62 U/L (ref 35–144)
Bilirubin, Direct: 0.1 mg/dL (ref 0.0–0.2)
Globulin: 2.9 g/dL (calc) (ref 1.9–3.7)
Indirect Bilirubin: 0.4 mg/dL (calc) (ref 0.2–1.2)
Total Bilirubin: 0.5 mg/dL (ref 0.2–1.2)
Total Protein: 7.3 g/dL (ref 6.1–8.1)

## 2020-07-08 LAB — FOLATE: Folate: >24 ng/mL

## 2020-07-08 LAB — VITAMIN B12: Vitamin B-12: 1067 pg/mL (ref 200–1100)

## 2020-07-08 MED ORDER — TOLAK 4 % EX CREA: TOPICAL_CREAM | CUTANEOUS | 0 refills | Status: DC

## 2020-07-08 NOTE — Progress Notes (Signed)
Per patient, Tobe Sos was sent to [facility]

## 2020-07-08 NOTE — Patient Instructions (Signed)

## 2020-07-09 ENCOUNTER — Other Ambulatory Visit: Payer: Self-pay | Admitting: Dermatology

## 2020-07-09 MED ORDER — TOLAK 4 % EX CREA: 1.0000 "application " | TOPICAL_CREAM | Freq: Every day | CUTANEOUS | 1 refills | Status: DC

## 2020-07-09 NOTE — Telephone Encounter (Signed)
Phone call to patient to let them know that we will send [facility] to [facility] 40$ cash pay. Gave him phone number to call.

## 2020-07-09 NOTE — Telephone Encounter (Signed)
[facility] on [address] in [place], [place] left message on office voice mail that they could not fill prescription for Fluorouracil Cream 4% because they could not find it or it was not on their medicine list.  Pharmacy wants to know if prescription can be change to 5% instead of 4%.

## 2020-07-27 ENCOUNTER — Telehealth: Payer: Self-pay | Admitting: Internal Medicine

## 2020-07-27 NOTE — Telephone Encounter (Signed)
Patient states when he had a visit with Dr. Ronnald Ramp on 07/07/20 Dr. Ronnald Ramp stated they were going to order a acapella device to help with patients breathing and patient is wondering if that has been ordered or if it was something he needed to do.  Patient # [id number]

## 2020-07-27 NOTE — Telephone Encounter (Signed)
Pt has been informed and expressed understanding.  

## 2020-08-09 ENCOUNTER — Other Ambulatory Visit: Payer: Self-pay | Admitting: Internal Medicine

## 2020-08-09 DIAGNOSIS — I10 Essential (primary) hypertension: Secondary | ICD-10-CM

## 2020-08-09 DIAGNOSIS — I2510 Atherosclerotic heart disease of native coronary artery without angina pectoris: Secondary | ICD-10-CM

## 2020-08-14 ENCOUNTER — Encounter: Payer: Self-pay | Admitting: Dermatology

## 2020-08-14 NOTE — Progress Notes (Signed)
   Follow-Up Visit   Subjective  Oscar Phillips is a 64 y.o. male who presents for the following: Skin Problem (right chin and right jawline new per patient. PCP said yesterday to have it checked by derm. 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  New growths Location: Face Duration:  Quality:  Associated Signs/Symptoms: Modifying Factors:  Severity:  Timing: Context:   Objective  Well appearing patient in no apparent distress; mood and affect are within normal limits.  All sun exposed areas plus back examined.   Assessment & Plan    Neoplasm of uncertain behavior of skin (2) Right Parotid Area  Skin / nail biopsy Type of biopsy: tangential   Informed consent: discussed and consent obtained   Timeout: patient name, date of birth, surgical site, and procedure verified   Anesthesia: the lesion was anesthetized in a standard fashion   Anesthetic:  1% lidocaine w/ epinephrine 1-100,000 local infiltration Instrument used: flexible razor blade   Hemostasis achieved with: ferric subsulfate   Outcome: patient tolerated procedure well   Post-procedure details: sterile dressing applied and wound care instructions given   Dressing type: bandage and petrolatum    Specimen 1 - Surgical pathology Differential Diagnosis: BCC SCC AK Check Margins: No  Right Chin Area  Skin / nail biopsy Type of biopsy: tangential   Informed consent: discussed and consent obtained   Timeout: patient name, date of birth, surgical site, and procedure verified   Anesthesia: the lesion was anesthetized in a standard fashion   Anesthetic:  1% lidocaine w/ epinephrine 1-100,000 local infiltration Instrument used: flexible razor blade   Hemostasis achieved with: ferric subsulfate   Outcome: patient tolerated procedure well   Post-procedure details: sterile  dressing applied and wound care instructions given   Dressing type: bandage and petrolatum    Specimen 2 - Surgical pathology Differential Diagnosis: BCC SCC AK Check Margins: No  AK (actinic keratosis) (2) Left Forearm - Posterior; Right Forearm - Posterior  Personal history of skin cancer (2) Left Nasal Sidewall; Right Forehead  Return if there is any new growth or bleeding.     I, Lavonna Monarch, MD, have reviewed all documentation for this visit.  The documentation on 08/14/20 for the exam, diagnosis, procedures, and orders are all accurate and complete.

## 2020-09-25 ENCOUNTER — Other Ambulatory Visit: Payer: Self-pay | Admitting: Internal Medicine

## 2020-09-25 DIAGNOSIS — J4489 Other specified chronic obstructive pulmonary disease: Secondary | ICD-10-CM

## 2020-09-25 DIAGNOSIS — J449 Chronic obstructive pulmonary disease, unspecified: Secondary | ICD-10-CM

## 2020-11-24 ENCOUNTER — Telehealth: Payer: Self-pay

## 2020-11-24 NOTE — Telephone Encounter (Signed)
Pt reminded to complete cologuard kit for colon cancer screening.  Pt states he still has the kit, however, he has decided he does not want to complete it b/c of being embarrassed to send a stool sample.  Pt assured that this is normal & can be completed in privacy of his home.  Pt continues to decline.  Offered referral for colonoscopy to complete this part of his health screening & pt declines.  Offered a visit to discuss, pt continues to decline.  Pt states his health is bad & he will take his chances.

## 2021-01-17 ENCOUNTER — Other Ambulatory Visit: Payer: Self-pay | Admitting: Internal Medicine

## 2021-01-17 DIAGNOSIS — J449 Chronic obstructive pulmonary disease, unspecified: Secondary | ICD-10-CM

## 2021-03-14 ENCOUNTER — Other Ambulatory Visit: Payer: Self-pay | Admitting: Internal Medicine

## 2021-03-14 ENCOUNTER — Telehealth: Payer: Self-pay | Admitting: Internal Medicine

## 2021-03-14 DIAGNOSIS — R918 Other nonspecific abnormal finding of lung field: Secondary | ICD-10-CM | POA: Insufficient documentation

## 2021-03-14 DIAGNOSIS — J449 Chronic obstructive pulmonary disease, unspecified: Secondary | ICD-10-CM

## 2021-03-28 NOTE — Telephone Encounter (Signed)
1.Medication Requested:  TRELEGY ELLIPTA 100-62.5-25 MCG/INH AEPB   2. Pharmacy (Name, Street, [place]): [facility] [address]  3. On Med List: yes   4. Last Visit with PCP: 07-07-20  5. Next visit date with PCP: n/a   Patient is requesting a call back. Please advise    Agent: Please be advised that RX refills may take up to 3 business days. We ask that you follow-up with your pharmacy.

## 2021-04-04 ENCOUNTER — Encounter: Payer: Self-pay | Admitting: Internal Medicine

## 2021-04-04 ENCOUNTER — Ambulatory Visit (INDEPENDENT_AMBULATORY_CARE_PROVIDER_SITE_OTHER): Payer: Medicare Other | Admitting: Internal Medicine

## 2021-04-04 ENCOUNTER — Other Ambulatory Visit: Payer: Self-pay

## 2021-04-04 VITALS — BP 180/96 | HR 113 | Temp 98.2°F | Resp 16 | Ht 68.0 in | Wt 174.0 lb

## 2021-04-04 DIAGNOSIS — E538 Deficiency of other specified B group vitamins: Secondary | ICD-10-CM

## 2021-04-04 DIAGNOSIS — R972 Elevated prostate specific antigen [PSA]: Secondary | ICD-10-CM

## 2021-04-04 DIAGNOSIS — I2510 Atherosclerotic heart disease of native coronary artery without angina pectoris: Secondary | ICD-10-CM | POA: Diagnosis not present

## 2021-04-04 DIAGNOSIS — I10 Essential (primary) hypertension: Secondary | ICD-10-CM | POA: Diagnosis not present

## 2021-04-04 DIAGNOSIS — R000 Tachycardia, unspecified: Secondary | ICD-10-CM

## 2021-04-04 DIAGNOSIS — E278 Other specified disorders of adrenal gland: Secondary | ICD-10-CM | POA: Diagnosis not present

## 2021-04-04 DIAGNOSIS — E785 Hyperlipidemia, unspecified: Secondary | ICD-10-CM | POA: Diagnosis not present

## 2021-04-04 DIAGNOSIS — F1010 Alcohol abuse, uncomplicated: Secondary | ICD-10-CM

## 2021-04-04 DIAGNOSIS — J449 Chronic obstructive pulmonary disease, unspecified: Secondary | ICD-10-CM

## 2021-04-04 LAB — HEPATIC FUNCTION PANEL
ALT: 102 U/L — ABNORMAL HIGH (ref 0–53)
AST: 89 U/L — ABNORMAL HIGH (ref 0–37)
Albumin: 4.6 g/dL (ref 3.5–5.2)
Alkaline Phosphatase: 69 U/L (ref 39–117)
Bilirubin, Direct: 0.1 mg/dL (ref 0.0–0.3)
Total Bilirubin: 0.6 mg/dL (ref 0.2–1.2)
Total Protein: 7.2 g/dL (ref 6.0–8.3)

## 2021-04-04 LAB — LIPID PANEL
Cholesterol: 190 mg/dL (ref 0–200)
HDL: 66.4 mg/dL (ref >39.00)
LDL Cholesterol: 91 mg/dL (ref 0–99)
NonHDL: 123.16
Total CHOL/HDL Ratio: 3
Triglycerides: 160 mg/dL — ABNORMAL HIGH (ref 0.0–149.0)
VLDL: 32 mg/dL (ref 0.0–40.0)

## 2021-04-04 LAB — CBC WITH DIFFERENTIAL/PLATELET
Basophils Absolute: 0 10*3/uL (ref 0.0–0.1)
Basophils Relative: 0.5 % (ref 0.0–3.0)
Eosinophils Absolute: 0 10*3/uL (ref 0.0–0.7)
Eosinophils Relative: 0.2 % (ref 0.0–5.0)
HCT: 48.4 % (ref 39.0–52.0)
Hemoglobin: 17 g/dL (ref 13.0–17.0)
Lymphocytes Relative: 20.8 % (ref 12.0–46.0)
Lymphs Abs: 1.6 10*3/uL (ref 0.7–4.0)
MCHC: 35.2 g/dL (ref 30.0–36.0)
MCV: 97.2 fl (ref 78.0–100.0)
Monocytes Absolute: 0.6 10*3/uL (ref 0.1–1.0)
Monocytes Relative: 8.1 % (ref 3.0–12.0)
Neutro Abs: 5.3 10*3/uL (ref 1.4–7.7)
Neutrophils Relative %: 70.4 % (ref 43.0–77.0)
Platelets: 241 10*3/uL (ref 150.0–400.0)
RBC: 4.98 Mil/uL (ref 4.22–5.81)
RDW: 13.4 % (ref 11.5–15.5)
WBC: 7.5 10*3/uL (ref 4.0–10.5)

## 2021-04-04 LAB — BASIC METABOLIC PANEL
BUN: 8 mg/dL (ref 6–23)
CO2: 21 mEq/L (ref 19–32)
Calcium: 9.7 mg/dL (ref 8.4–10.5)
Chloride: 95 mEq/L — ABNORMAL LOW (ref 96–112)
Creatinine, Ser: 0.8 mg/dL (ref 0.40–1.50)
GFR: 92.79 mL/min (ref >60.00)
Glucose, Bld: 88 mg/dL (ref 70–99)
Potassium: 4.5 mEq/L (ref 3.5–5.1)
Sodium: 131 mEq/L — ABNORMAL LOW (ref 135–145)

## 2021-04-04 LAB — VITAMIN B12: Vitamin B-12: 1422 pg/mL — ABNORMAL HIGH (ref 211–911)

## 2021-04-04 LAB — FOLATE: Folate: >24.4 ng/mL (ref >5.9)

## 2021-04-04 LAB — PSA: PSA: 10.55 ng/mL — ABNORMAL HIGH (ref 0.10–4.00)

## 2021-04-04 LAB — CORTISOL: Cortisol, Plasma: 9.4 ug/dL

## 2021-04-04 LAB — TSH: TSH: 0.73 u[IU]/mL (ref 0.35–4.50)

## 2021-04-04 LAB — TROPONIN I (HIGH SENSITIVITY): High Sens Troponin I: 10 ng/L (ref 2–17)

## 2021-04-04 MED ORDER — ALBUTEROL SULFATE HFA 108 (90 BASE) MCG/ACT IN AERS: 2.0000 | INHALATION_SPRAY | Freq: Four times a day (QID) | RESPIRATORY_TRACT | 2 refills | Status: AC | PRN

## 2021-04-04 MED ORDER — EDARBI 80 MG PO TABS: 1.0000 | ORAL_TABLET | Freq: Every day | ORAL | 1 refills | Status: DC

## 2021-04-04 MED ORDER — TRELEGY ELLIPTA 100-62.5-25 MCG/INH IN AEPB: 1.0000 | INHALATION_SPRAY | Freq: Every day | RESPIRATORY_TRACT | 1 refills | Status: DC

## 2021-04-04 MED ORDER — NEBIVOLOL HCL 5 MG PO TABS
5.0000 mg | ORAL_TABLET | Freq: Every day | ORAL | 0 refills | Status: DC
Start: 2021-04-04 — End: 2021-07-01

## 2021-04-04 MED ORDER — THIAMINE MONONITRATE 100 MG PO TABS: ORAL_TABLET | ORAL | 1 refills | Status: AC

## 2021-04-04 MED ORDER — TORSEMIDE 20 MG PO TABS
20.0000 mg | ORAL_TABLET | Freq: Every day | ORAL | 0 refills | Status: DC
Start: 2021-04-04 — End: 2021-06-30

## 2021-04-04 NOTE — Progress Notes (Signed)
Subjective:  Patient ID: Oscar Phillips, male    DOB: Dec 02, 1955  Age: 65 y.o. MRN: [id number]  CC: Coronary Artery Disease, Hypertension, Hyperlipidemia, and COPD  This visit occurred during the SARS-CoV-2 public health emergency.  Safety protocols were in place, including screening questions prior to the visit, additional usage of staff PPE, and extensive cleaning of exam room while observing appropriate contact time as indicated for disinfecting solutions.    HPI Oscar Phillips presents for f/up -   He complains of a several month history of heaviness in the right anterior side of his chest.  This sensation occurs at rest.  He has chronic cough, rarely productive of yellow phlegm, wheezing, and dyspnea on exertion.  He complains of weight gain.  He denies abdominal pain, nausea, vomiting, diarrhea, or constipation.  He has been taking the ARB but has not been taking indapamide or nebivolol.  Outpatient Medications Prior to Visit  Medication Sig Dispense Refill   cyanocobalamin 2000 MCG tablet Take 1 tablet (2,000 mcg total) by mouth daily. 90 tablet 1   Fluorouracil (TOLAK) 4 % CREA Apply 1 application topically at bedtime. 40 g 1   lansoprazole (PREVACID) 30 MG capsule TAKE 1 CAPSULE(30 MG) BY MOUTH TWICE DAILY BEFORE A MEAL 180 capsule 0   Multiple Vitamins-Minerals (CERTAVITE/ANTIOXIDANTS) TABS Take 1 Tablet BY MOUTH EVERY DAY for 30 days     albuterol (VENTOLIN HFA) 108 (90 Base) MCG/ACT inhaler Inhale 2 puffs into the lungs every 6 (six) hours as needed for wheezing or shortness of breath. 8.5 g 2   EDARBI 80 MG TABS TAKE ONE TABLET BY MOUTH DAILY. 90 tablet 1   Fluorouracil (TOLAK) 4 % CREA Apply topically to affected area daily 40 g 0   imipramine (TOFRANIL) 50 MG tablet Take 50 mg by mouth at bedtime.     indapamide (LOZOL) 1.25 MG tablet TAKE 1 TABLET(1.25 MG) BY MOUTH DAILY 90 tablet 0   nebivolol (BYSTOLIC) 5 MG tablet Take 1 tablet (5 mg total) by mouth daily. 90 tablet 0    thiamine (VITAMIN B-1) 100 MG tablet Take 1 Tablet BY MOUTH TWICE DAILY for 14 days 90 tablet 1   TRELEGY ELLIPTA 100-62.5-25 MCG/INH AEPB INHALE 1 PUFF INTO THE LUNGS DAILY 120 each 0   No facility-administered medications prior to visit.    ROS Review of Systems  Constitutional:  Positive for unexpected weight change (wt gain). Negative for chills, diaphoresis, fatigue and fever.  HENT: Negative.  Negative for sore throat.   Eyes: Negative.   Respiratory:  Positive for cough, shortness of breath and wheezing. Negative for chest tightness and stridor.   Cardiovascular:  Positive for chest pain. Negative for palpitations and leg swelling.  Gastrointestinal:  Negative for abdominal pain, constipation, diarrhea, nausea and vomiting.  Endocrine: Negative.   Genitourinary:  Negative for difficulty urinating.  Musculoskeletal:  Negative for arthralgias and myalgias.  Skin: Negative.  Negative for color change and pallor.  Allergic/Immunologic: Negative.   Neurological: Negative.  Negative for dizziness, weakness and headaches.  Hematological:  Negative for adenopathy. Does not bruise/bleed easily.   Objective:  BP (!) 180/96 (BP Location: Right Arm, Patient Position: Sitting, Cuff Size: Large)   Pulse (!) 113   Temp 98.2 F (36.8 C) (Oral)   Resp 16   Ht 5\' 8"  (1.727 m)   Wt 174 lb (78.9 kg)   SpO2 95%   BMI 26.46 kg/m   BP Readings from Last 3 Encounters:  04/04/21 (!) 180/96  07/07/20 136/76  04/23/20 (!) 146/72    Wt Readings from Last 3 Encounters:  04/04/21 174 lb (78.9 kg)  07/07/20 160 lb (72.6 kg)  04/23/20 164 lb 2 oz (74.4 kg)    Physical Exam Vitals reviewed.  Constitutional:      General: He is not in acute distress.    Appearance: Normal appearance. He is ill-appearing. He is not toxic-appearing or diaphoretic.  HENT:     Nose: Nose normal.     Mouth/Throat:     Mouth: Mucous membranes are moist.  Eyes:     General: No scleral icterus.     Conjunctiva/sclera: Conjunctivae normal.  Cardiovascular:     Rate and Rhythm: Regular rhythm. Tachycardia present.     Heart sounds: No murmur heard.    Comments: EKG- ST, 110 pm LAD, RBBB No change from the prior EKG Pulmonary:     Effort: Pulmonary effort is normal. No tachypnea, accessory muscle usage or respiratory distress.     Breath sounds: Examination of the right-middle field reveals rhonchi. Examination of the left-middle field reveals rhonchi. Examination of the right-lower field reveals rhonchi. Examination of the left-lower field reveals rhonchi. Rhonchi present. No decreased breath sounds, wheezing or rales.  Abdominal:     General: Abdomen is flat.     Palpations: There is no mass.     Tenderness: There is no abdominal tenderness. There is no guarding.     Hernia: No hernia is present.  Musculoskeletal:        General: Normal range of motion.     Cervical back: Neck supple.     Right lower leg: No edema.     Left lower leg: No edema.  Lymphadenopathy:     Cervical: No cervical adenopathy.  Skin:    General: Skin is warm and dry.  Neurological:     General: No focal deficit present.     Mental Status: He is alert.  Psychiatric:        Attention and Perception: He is inattentive.        Speech: Speech is tangential.        Behavior: Behavior normal.        Thought Content: Thought content normal. Thought content is not paranoid. Thought content does not include homicidal or suicidal ideation.    Lab Results  Component Value Date   WBC 7.5 04/04/2021   HGB 17.0 04/04/2021   HCT 48.4 04/04/2021   PLT 241.0 04/04/2021   GLUCOSE 88 04/04/2021   CHOL 190 04/04/2021   TRIG 160.0 (H) 04/04/2021   HDL 66.40 04/04/2021   LDLDIRECT 154.1 06/03/2010   LDLCALC 91 04/04/2021   ALT 102 (H) 04/04/2021   AST 89 (H) 04/04/2021   NA 131 (L) 04/04/2021   K 4.5 04/04/2021   CL 95 (L) 04/04/2021   CREATININE 0.80 04/04/2021   BUN 8 04/04/2021   CO2 21 04/04/2021   TSH  0.73 04/04/2021   PSA 10.55 (H) 04/04/2021   INR 1.0 07/07/2020   HGBA1C 5.5 03/02/2018    CT Angio Chest W/Cm &/Or Wo Cm  Result Date: 03/25/2020 CLINICAL DATA:  Chest pain.  Shortness of breath.  Positive D-dimer EXAM: CT ANGIOGRAPHY CHEST WITH CONTRAST TECHNIQUE: Multidetector CT imaging of the chest was performed using the standard protocol during bolus administration of intravenous contrast. Multiplanar CT image reconstructions and MIPs were obtained to evaluate the vascular anatomy. CONTRAST:  42mL OMNIPAQUE IOHEXOL 350 MG/ML SOLN COMPARISON:  Chest x-ray dated 03/24/2020 FINDINGS:  Cardiovascular: Satisfactory opacification of the pulmonary arteries to the segmental level. No evidence of pulmonary embolism. Normal heart size. Left ventricular hypertrophy. Aortic atherosclerosis. No pericardial effusion. Mediastinum/Nodes: There is a 10 mm node at the right hilum seen on image 44 of series 4. No other adenopathy in the mediastinum or hilar regions are axillae. The thyroid gland, trachea and esophagus are normal. Lungs/Pleura: Extensive emphysematous changes both lungs primarily in the upper lobes. 3 mm nodule in the right upper lobe on image 27 4 mm nodule in the right upper lobe on image 20 both on series 6. More other pulmonary nodules. No infiltrates or effusions. Upper Abdomen: Bilateral adrenal hyperplasia. Hepatic steatosis. 2.4 cm area of poorly defined low-density in the lateral aspect of the left lobe of the liver may represent an area of increased fatty infiltration or possibly a small hemangioma. This is a typical site for focal fatty infiltration. Musculoskeletal: No acute abnormality. Old mild anterior wedge deformities of several midthoracic vertebra. Review of the MIP images confirms the above findings. IMPRESSION: 1. No pulmonary emboli or other acute abnormalities. 2. Extensive emphysematous changes. 3. 10 mm node at the right hilum, nonspecific. 4. Two small nodules in the right upper  lobe. No follow-up needed if patient is low-risk (and has no known or suspected primary neoplasm). Non-contrast chest CT can be considered in 12 months if patient is high-risk. This recommendation follows the consensus statement: Guidelines for Management of Incidental Pulmonary Nodules Detected on CT Images: From the Fleischner Society 2017; Radiology 2017; 284:228-243. 5. Hepatic steatosis. 6. Bilateral adrenal hyperplasia. 7. 2.4 cm area of poorly defined low-density in the lateral aspect of the left lobe of the liver, probably an area of increased fatty infiltration or possibly a small hemangioma. 8. Emphysema and aortic atherosclerosis. Aortic Atherosclerosis (ICD10-I70.0) and Emphysema (ICD10-J43.9). Electronically Signed   By: Lorriane Shire M.D.   On: 03/25/2020 14:24    Assessment & Plan:   Oscar Phillips was seen today for coronary artery disease, hypertension, hyperlipidemia and copd.  Diagnoses and all orders for this visit:  Adrenal hyperplasia ([place])- This is very likely related to the chronic alcohol intake.  Will screen for hormonal excess. -     Basic metabolic panel; Future -     Cortisol; Future -     Aldosterone + renin activity w/ ratio; Future -     Aldosterone + renin activity w/ ratio -     Cortisol -     Basic metabolic panel  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 exacerbation (HCC) -     albuterol (VENTOLIN HFA) 108 (90 Base) MCG/ACT inhaler; Inhale 2 puffs into the lungs every 6 (six) hours as needed for wheezing or shortness of breath. -     Fluticasone-Umeclidin-Vilant (TRELEGY ELLIPTA) 100-62.5-25 MCG/INH AEPB; Inhale 1 puff into the lungs daily.  Coronary artery disease involving native coronary artery of native heart without angina pectoris -     Azilsartan Medoxomil (EDARBI) 80 MG TABS; Take 1 tablet (80 mg total) by mouth daily. -     Troponin I (High Sensitivity); Future -     Troponin I (High Sensitivity) -     Ambulatory referral to Cardiology  Essential  hypertension- His blood pressure is not well controlled.  He has hyponatremia.  Will check labs to screen for secondary causes and endorgan damage.  Will continue the ARB.  Will restart nebivolol and will start a loop diuretic. -     Azilsartan Medoxomil (EDARBI) 80 MG TABS; Take 1  tablet (80 mg total) by mouth daily. -     Basic metabolic panel; Future -     TSH; Future -     Cortisol; Future -     Aldosterone + renin activity w/ ratio; Future -     EKG 12-Lead -     Aldosterone + renin activity w/ ratio -     Cortisol -     TSH -     Basic metabolic panel -     nebivolol (BYSTOLIC) 5 MG tablet; Take 1 tablet (5 mg total) by mouth daily. -     torsemide (DEMADEX) 20 MG tablet; Take 1 tablet (20 mg total) by mouth daily.  B12 deficiency -     Vitamin B12; Future -     CBC with Differential/Platelet; Future -     Folate; Future -     Folate -     CBC with Differential/Platelet -     Vitamin B12  Hyperlipidemia with target LDL less than 100 -     Lipid panel; Future -     TSH; Future -     Hepatic function panel; Future -     Hepatic function panel -     TSH -     Lipid panel  PSA elevation -     PSA; Future -     PSA -     Ambulatory referral to Urology  Tachycardia -     nebivolol (BYSTOLIC) 5 MG tablet; Take 1 tablet (5 mg total) by mouth daily.  Chronic alcohol abuse- He has alcoholic hepatitis. -     thiamine (VITAMIN B-1) 100 MG tablet; Take 1 Tablet BY MOUTH TWICE DAILY for 14 days  I have discontinued Saralyn Pilar Trivedi's imipramine and indapamide. I have also changed his Trelegy Ellipta and Edarbi. Additionally, I am having him start on torsemide. Lastly, I am having him maintain his CertaVite/Antioxidants, cyanocobalamin, lansoprazole, Tolak, albuterol, nebivolol, and thiamine.  Meds ordered this encounter  Medications   albuterol (VENTOLIN HFA) 108 (90 Base) MCG/ACT inhaler    Sig: Inhale 2 puffs into the lungs every 6 (six) hours as needed for wheezing or  shortness of breath.    Dispense:  8.5 g    Refill:  2   Fluticasone-Umeclidin-Vilant (TRELEGY ELLIPTA) 100-62.5-25 MCG/INH AEPB    Sig: Inhale 1 puff into the lungs daily.    Dispense:  120 each    Refill:  1   Azilsartan Medoxomil (EDARBI) 80 MG TABS    Sig: Take 1 tablet (80 mg total) by mouth daily.    Dispense:  90 tablet    Refill:  1   nebivolol (BYSTOLIC) 5 MG tablet    Sig: Take 1 tablet (5 mg total) by mouth daily.    Dispense:  90 tablet    Refill:  0   torsemide (DEMADEX) 20 MG tablet    Sig: Take 1 tablet (20 mg total) by mouth daily.    Dispense:  90 tablet    Refill:  0   thiamine (VITAMIN B-1) 100 MG tablet    Sig: Take 1 Tablet BY MOUTH TWICE DAILY for 14 days    Dispense:  90 tablet    Refill:  1     Follow-up: Return in about 6 weeks (around 05/16/2021).  Scarlette Calico, MD

## 2021-04-04 NOTE — Patient Instructions (Signed)

## 2021-04-06 ENCOUNTER — Ambulatory Visit: Payer: Self-pay | Admitting: Physician Assistant

## 2021-04-07 MED ORDER — ROSUVASTATIN CALCIUM 10 MG PO TABS: 10.0000 mg | ORAL_TABLET | Freq: Every day | ORAL | 1 refills | Status: DC

## 2021-04-11 LAB — ALDOSTERONE + RENIN ACTIVITY W/ RATIO
ALDO / PRA Ratio: 1.7 Ratio (ref 0.9–28.9)
Aldosterone: 3 ng/dL
Renin Activity: 1.73 ng/mL/h (ref 0.25–5.82)

## 2021-05-09 ENCOUNTER — Other Ambulatory Visit: Payer: Self-pay | Admitting: Internal Medicine

## 2021-05-09 DIAGNOSIS — I2510 Atherosclerotic heart disease of native coronary artery without angina pectoris: Secondary | ICD-10-CM

## 2021-05-09 DIAGNOSIS — I10 Essential (primary) hypertension: Secondary | ICD-10-CM

## 2021-06-30 ENCOUNTER — Other Ambulatory Visit: Payer: Self-pay | Admitting: Internal Medicine

## 2021-06-30 DIAGNOSIS — I10 Essential (primary) hypertension: Secondary | ICD-10-CM

## 2021-07-01 ENCOUNTER — Other Ambulatory Visit: Payer: Self-pay | Admitting: Internal Medicine

## 2021-07-01 DIAGNOSIS — I10 Essential (primary) hypertension: Secondary | ICD-10-CM

## 2021-07-01 DIAGNOSIS — R000 Tachycardia, unspecified: Secondary | ICD-10-CM

## 2021-07-14 ENCOUNTER — Telehealth: Payer: Self-pay | Admitting: Cardiology

## 2021-07-14 NOTE — Telephone Encounter (Signed)
Spoke with patient. He asked about his heart functioning at 25% capacity.He reports feeling extremely tired and feels bad. His friend grocery shops for him and helps him out. Reports his drinks and smokes too much and believes that is why he gets up to void during the night. Reports he is taking prescribed medications in the mornings. Explained to patient that he has an appointment on Monday and to report his concerns to the doctor.

## 2021-07-14 NOTE — Telephone Encounter (Signed)
heart is functioning at a 25% capacity pt would like to know if this will be ongoing.. please advise

## 2021-07-18 ENCOUNTER — Ambulatory Visit (INDEPENDENT_AMBULATORY_CARE_PROVIDER_SITE_OTHER): Payer: Medicare Other | Admitting: Medical

## 2021-07-18 ENCOUNTER — Other Ambulatory Visit: Payer: Self-pay

## 2021-07-18 ENCOUNTER — Encounter: Payer: Self-pay | Admitting: Medical

## 2021-07-18 VITALS — BP 150/80 | HR 92 | Ht 68.0 in | Wt 182.6 lb

## 2021-07-18 DIAGNOSIS — F1010 Alcohol abuse, uncomplicated: Secondary | ICD-10-CM

## 2021-07-18 DIAGNOSIS — I10 Essential (primary) hypertension: Secondary | ICD-10-CM | POA: Diagnosis not present

## 2021-07-18 DIAGNOSIS — I2510 Atherosclerotic heart disease of native coronary artery without angina pectoris: Secondary | ICD-10-CM

## 2021-07-18 DIAGNOSIS — E785 Hyperlipidemia, unspecified: Secondary | ICD-10-CM | POA: Diagnosis not present

## 2021-07-18 DIAGNOSIS — R0683 Snoring: Secondary | ICD-10-CM

## 2021-07-18 DIAGNOSIS — I5042 Chronic combined systolic (congestive) and diastolic (congestive) heart failure: Secondary | ICD-10-CM

## 2021-07-18 DIAGNOSIS — R29818 Other symptoms and signs involving the nervous system: Secondary | ICD-10-CM

## 2021-07-18 DIAGNOSIS — Z79899 Other long term (current) drug therapy: Secondary | ICD-10-CM

## 2021-07-18 DIAGNOSIS — Z720 Tobacco use: Secondary | ICD-10-CM

## 2021-07-18 MED ORDER — SPIRONOLACTONE 25 MG PO TABS: 25.0000 mg | ORAL_TABLET | Freq: Every day | ORAL | 3 refills | Status: DC

## 2021-07-18 NOTE — Patient Instructions (Signed)
Medication Instructions:  START Spironolactone 25 mg daily *If you need a refill on your cardiac medications before your next appointment, please call your pharmacy*  Lab Work: Your physician recommends that you return for lab work in 2 weeks:  BMET  If you have labs (blood work) drawn today and your tests are completely normal, you will receive your results only by: [place] (if you have MyChart) OR A paper copy in the mail If you have any lab test that is abnormal or we need to change your treatment, we will call you to review the results.  Testing/Procedures: Your physician has requested that you have an echocardiogram. Echocardiography is a painless test that uses sound waves to create images of your heart. It provides your doctor with information about the size and shape of your heart and how well your heart's chambers and valves are working. This procedure takes approximately one hour. There are no restrictions for this procedure.  Please schedule for 1-2 weeks at [place] office   Follow-Up: At [facility], you and your health needs are our priority.  As part of our continuing mission to provide you with exceptional heart care, we have created designated Provider Care Teams.  These Care Teams include your primary Cardiologist (physician) and Advanced Practice Providers (APPs -  Physician Assistants and Nurse Practitioners) who all work together to provide you with the care you need, when you need it.  Your next appointment:   2-3 month(s)  The format for your next appointment:   In Person  Provider:   Minus Breeding, MD  Other Instructions Contact Primary Care Physician (PCP) if interested in using Ch[facility] to help with quitting smoking Limit Alcohol to 1-2 drinks a day.      Low-Sodium Eating Plan Sodium, which is an element that makes up salt, helps you maintain a healthy balance of fluids in your body. Too much sodium can increase your blood pressure and  cause fluid and waste to be held in your body. Your health care provider or dietitian may recommend following this plan if you have high blood pressure (hypertension), kidney disease, liver disease, or heart failure. Eating less sodium can help lower your blood pressure, reduce swelling, and protect your heart, liver, and kidneys. What are tips for following this plan? Reading food labels The Nutrition Facts label lists the amount of sodium in one serving of the food. If you eat more than one serving, you must multiply the listed amount of sodium by the number of servings. Choose foods with less than 140 mg of sodium per serving. Avoid foods with 300 mg of sodium or more per serving. Shopping  Look for lower-sodium products, often labeled as "low-sodium" or "no salt added." Always check the sodium content, even if foods are labeled as "unsalted" or "no salt added." Buy fresh foods. Avoid canned foods and pre-made or frozen meals. Avoid canned, cured, or processed meats. Buy breads that have less than 80 mg of sodium per slice. Cooking  Eat more home-cooked food and less restaurant, buffet, and fast food. Avoid adding salt when cooking. Use salt-free seasonings or herbs instead of table salt or sea salt. Check with your health care provider or pharmacist before using salt substitutes. Cook with plant-based oils, such as canola, sunflower, or olive oil. Meal planning When eating at a restaurant, ask that your food be prepared with less salt or no salt, if possible. Avoid dishes labeled as brined, pickled, cured, smoked, or made with soy sauce,  miso, or teriyaki sauce. Avoid foods that contain MSG (monosodium glutamate). MSG is sometimes added to [place] food, bouillon, and some canned foods. Make meals that can be grilled, baked, poached, roasted, or steamed. These are generally made with less sodium. General information Most people on this plan should limit their sodium intake to 1,500-2,000  mg (milligrams) of sodium each day. What foods should I eat? Fruits Fresh, frozen, or canned fruit. Fruit juice. Vegetables Fresh or frozen vegetables. "No salt added" canned vegetables. "No salt added" tomato sauce and paste. Low-sodium or reduced-sodium tomato and vegetable juice. Grains Low-sodium cereals, including oats, puffed wheat and rice, and shredded wheat. Low-sodium crackers. Unsalted rice. Unsalted pasta. Low-sodium bread. Whole-grain breads and whole-grain pasta. Meats and other proteins Fresh or frozen (no salt added) meat, poultry, seafood, and fish. Low-sodium canned tuna and salmon. Unsalted nuts. Dried peas, beans, and lentils without added salt. Unsalted canned beans. Eggs. Unsalted nut butters. Dairy Milk. Soy milk. Cheese that is naturally low in sodium, such as ricotta cheese, fresh mozzarella, or Swiss cheese. Low-sodium or reduced-sodium cheese. Cream cheese. Yogurt. Seasonings and condiments Fresh and dried herbs and spices. Salt-free seasonings. Low-sodium mustard and ketchup. Sodium-free salad dressing. Sodium-free light mayonnaise. Fresh or refrigerated horseradish. Lemon juice. Vinegar. Other foods Homemade, reduced-sodium, or low-sodium soups. Unsalted popcorn and pretzels. Low-salt or salt-free chips. The items listed above may not be a complete list of foods and beverages you can eat. Contact a dietitian for more information. What foods should I avoid? Vegetables Sauerkraut, pickled vegetables, and relishes. Olives. [place] fries. Onion rings. Regular canned vegetables (not low-sodium or reduced-sodium). Regular canned tomato sauce and paste (not low-sodium or reduced-sodium). Regular tomato and vegetable juice (not low-sodium or reduced-sodium). Frozen vegetables in sauces. Grains Instant hot cereals. Bread stuffing, pancake, and biscuit mixes. Croutons. Seasoned rice or pasta mixes. Noodle soup cups. Boxed or frozen macaroni and cheese. Regular salted crackers.  Self-rising flour. Meats and other proteins Meat or fish that is salted, canned, smoked, spiced, or pickled. Precooked or cured meat, such as sausages or meat loaves. Berniece Salines. Ham. Pepperoni. Hot dogs. Corned beef. Chipped beef. Salt pork. Jerky. Pickled herring. Anchovies and sardines. Regular canned tuna. Salted nuts. Dairy Processed cheese and cheese spreads. Hard cheeses. Cheese curds. Blue cheese. Feta cheese. String cheese. Regular cottage cheese. Buttermilk. Canned milk. Fats and oils Salted butter. Regular margarine. Ghee. Bacon fat. Seasonings and condiments Onion salt, garlic salt, seasoned salt, table salt, and sea salt. Canned and packaged gravies. Worcestershire sauce. Tartar sauce. Barbecue sauce. Teriyaki sauce. Soy sauce, including reduced-sodium. Steak sauce. Fish sauce. Oyster sauce. Cocktail sauce. Horseradish that you find on the shelf. Regular ketchup and mustard. Meat flavorings and tenderizers. Bouillon cubes. Hot sauce. Pre-made or packaged marinades. Pre-made or packaged taco seasonings. Relishes. Regular salad dressings. Salsa. Other foods Salted popcorn and pretzels. Corn chips and puffs. Potato and tortilla chips. Canned or dried soups. Pizza. Frozen entrees and pot pies. The items listed above may not be a complete list of foods and beverages you should avoid. Contact a dietitian for more information. Summary Eating less sodium can help lower your blood pressure, reduce swelling, and protect your heart, liver, and kidneys. Most people on this plan should limit their sodium intake to 1,500-2,000 mg (milligrams) of sodium each day. Canned, boxed, and frozen foods are high in sodium. Restaurant foods, fast foods, and pizza are also very high in sodium. You also get sodium by adding salt to food. Try to cook at home,  eat more fresh fruits and vegetables, and eat less fast food and canned, processed, or prepared foods. This information is not intended to replace advice given to  you by your health care provider. Make sure you discuss any questions you have with your health care provider. Document Revised: 10/31/2019 Document Reviewed: 08/27/2019 Elsevier Patient Education  2022 [employer].  Steps to Quit Smoking Smoking tobacco is the leading cause of preventable death. It can affect almost every organ in the body. Smoking puts you and people around you at risk for many serious, long-lasting (chronic) diseases. Quitting smoking can be hard, but it is one of the best things that you can do for your health. It is never too late to quit. How do I get ready to quit? When you decide to quit smoking, make a plan to help you succeed. Before you quit: Pick a date to quit. Set a date within the next 2 weeks to give you time to prepare. Write down the reasons why you are quitting. Keep this list in places where you will see it often. Tell your family, friends, and co-workers that you are quitting. Their support is important. Talk with your doctor about the choices that may help you quit. Find out if your health insurance will pay for these treatments. Know the people, places, things, and activities that make you want to smoke (triggers). Avoid them. What first steps can I take to quit smoking? Throw away all cigarettes at home, at work, and in your car. Throw away the things that you use when you smoke, such as ashtrays and lighters. Clean your car. Make sure to empty the ashtray. Clean your home, including curtains and carpets. What can I do to help me quit smoking? Talk with your doctor about taking medicines and seeing a counselor at the same time. You are more likely to succeed when you do both. If you are pregnant or breastfeeding, talk with your doctor about counseling or other ways to quit smoking. Do not take medicine to help you quit smoking unless your doctor tells you to do so. To quit smoking: Quit right away Quit smoking totally, instead of slowly cutting back  on how much you smoke over a period of time. Go to counseling. You are more likely to quit if you go to counseling sessions regularly. Take medicine You may take medicines to help you quit. Some medicines need a prescription, and some you can buy over-the-counter. Some medicines may contain a drug called nicotine to replace the nicotine in cigarettes. Medicines may: Help you to stop having the desire to smoke (cravings). Help to stop the problems that come when you stop smoking (withdrawal symptoms). Your doctor may ask you to use: Nicotine patches, gum, or lozenges. Nicotine inhalers or sprays. Non-nicotine medicine that is taken by mouth. Find resources Find resources and other ways to help you quit smoking and remain smoke-free after you quit. These resources are most helpful when you use them often. They include: Online chats with a Social worker. Phone quitlines. Printed Furniture conservator/restorer. Support groups or group counseling. Text messaging programs. Mobile phone apps. Use apps on your mobile phone or tablet that can help you stick to your quit plan. There are many free apps for mobile phones and tablets as well as websites. Examples include Quit Guide from the [employer] and smokefree.gov  What things can I do to make it easier to quit?  Talk to your family and friends. Ask them to support and encourage  you. Call a phone quitline (1-800-QUIT-NOW), reach out to support groups, or work with a Social worker. Ask people who smoke to not smoke around you. Avoid places that make you want to smoke, such as: Bars. Parties. Smoke-break areas at work. Spend time with people who do not smoke. Lower the stress in your life. Stress can make you want to smoke. Try these things to help your stress: Getting regular exercise. Doing deep-breathing exercises. Doing yoga. Meditating. Doing a body scan. To do this, close your eyes, focus on one area of your body at a time from head to toe. Notice which parts of  your body are tense. Try to relax the muscles in those areas. How will I feel when I quit smoking? Day 1 to 3 weeks Within the first 24 hours, you may start to have some problems that come from quitting tobacco. These problems are very bad 2-3 days after you quit, but they do not often last for more than 2-3 weeks. You may get these symptoms: Mood swings. Feeling restless, nervous, angry, or annoyed. Trouble concentrating. Dizziness. Strong desire for high-sugar foods and nicotine. Weight gain. Trouble pooping (constipation). Feeling like you may vomit (nausea). Coughing or a sore throat. Changes in how the medicines that you take for other issues work in your body. Depression. Trouble sleeping (insomnia). Week 3 and afterward After the first 2-3 weeks of quitting, you may start to notice more positive results, such as: Better sense of smell and taste. Less coughing and sore throat. Slower heart rate. Lower blood pressure. Clearer skin. Better breathing. Fewer sick days. Quitting smoking can be hard. Do not give up if you fail the first time. Some people need to try a few times before they succeed. Do your best to stick to your quit plan, and talk with your doctor if you have any questions or concerns. Summary Smoking tobacco is the leading cause of preventable death. Quitting smoking can be hard, but it is one of the best things that you can do for your health. When you decide to quit smoking, make a plan to help you succeed. Quit smoking right away, not slowly over a period of time. When you start quitting, seek help from your doctor, family, or friends. This information is not intended to replace advice given to you by your health care provider. Make sure you discuss any questions you have with your health care provider. Document Revised: 06/20/2019 Document Reviewed: 12/14/2018 Elsevier Patient Education  [place].

## 2021-07-18 NOTE — Progress Notes (Signed)
Cardiology Office Note   Date:  07/21/2021   ID:  Oscar Phillips, DOB 1956/08/01, MRN [id number]  PCP:  Janith Lima, MD  Cardiologist:  Minus Breeding, MD EP: None  Chief Complaint  Patient presents with   Follow-up    CHF, fatigue      History of Present Illness: Oscar Phillips is a 65 y.o. 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  He was last evaluated by cardiology at an outpatient visit with Rosaria Ferries 10/2018 at which time he had several complaints including upper arm pain, neuropathy in his feet, poor appetite, minor non-exertional chest pain, and muscle twitching with higher doses of Remeron. He had some weight gain which he attributed to snacking but no evidence of volume overload on exam. It is noted that he refused medication changes following CHF diagnosis, therefore was not started on an ARB/ARNI (did have cough with lisinopril) He was recommended to repeat an echocardiogram at that time however was lost to follow-up.  His cardiac history dates back to 06/2018 when he presented to the hospital with anorexia symptoms and was found to have elevated HsTrops and cardiogenic shock. Echocardiogram in 2019 showed EF 35-40%, mild LVH, akinesis of the mid-apicalanteroseptal, anterior, anterolateral, lateral, inferolateral, and apical myocardium, and no significant valvular abnormalities. He then underwent a LHC which showed 50% LAD stenosis, 70% D1 stenosis, 50-60% mLCx stenosis, 20% RCA stenosis, and apical ballooning of the LV c/w Takotsubo cardiomyopathy with EF estimated to be 25% at that time.   He contacted the office 07/14/21 asking about his history of CHF with EF 25% and whether this is contributing to his fatigue and overall feeling poorly. He has continued to follow  with his PCP, Dr. Ronnald Ramp despite being lost to cardiology care. At his last visit 03/2021 his BP was markedly elevated and he had complaints of right-sided chest discomfort at rest. He was recommended for blood work to screen for adrenal hyperplasia given chronic Etoh abuse and he was restarted on Nebivolol and torsemide in addition to azilsartan.  He presents today with his brother. He has had ongoing fatigue and DOE. He reports rare episodes of chest discomfort which last for seconds at a time and are not exertional in nature. He has continued smoking 1-2 ppd and drinking up to a 12 pack of beer a day, sometimes alternating with wine which he may drink 1/2-3/4 of a bottle a day. We discussed cardiovascular/pulmonary risks with ongoing use and importance of cutting back/quitting. He reports abdominal bloating and orthopnea but no LE edema or PND. He denies palpitations, dizziness, lightheadedness, or syncope.     Past Medical History:  Diagnosis Date   Abdominal bruit    Alcoholism ([place])    Anxiety    Atypical mole 11/06/2018   left post shoulder (mild)   B12 deficiency    Basal cell carcinoma 11/06/2018   right forehead bcc nod.   BCC (basal cell carcinoma of skin) 11/06/2018   left nasal ala    Bronchitis    COPD (chronic obstructive pulmonary disease) (HCC)    GERD (gastroesophageal reflux disease)    H/O trichomonal urethritis    History of panic attacks    History of rectal bleeding    in August 2016   HLD (hyperlipidemia)  HTN (hypertension)    OCD (obsessive compulsive disorder)    PSA elevation     Past Surgical History:  Procedure Laterality Date   ELBOW FRACTURE SURGERY Left    LEFT HEART CATH AND CORONARY ANGIOGRAPHY N/A 07/04/2018   Procedure: LEFT HEART CATH AND CORONARY ANGIOGRAPHY;  Surgeon: Belva Crome, MD;  Location: [facility];  Service: Cardiovascular;  Laterality: N/A;   LUMBAR DISC SURGERY     TONSILLECTOMY  1965     Current Outpatient  Medications  Medication Sig Dispense Refill   albuterol (VENTOLIN HFA) 108 (90 Base) MCG/ACT inhaler Inhale 2 puffs into the lungs every 6 (six) hours as needed for wheezing or shortness of breath. 8.5 g 2   EDARBI 80 MG TABS TAKE 1 TABLET BY MOUTH ONCE DAILY 90 tablet 0   Fluticasone-Umeclidin-Vilant (TRELEGY ELLIPTA) 100-62.5-25 MCG/INH AEPB Inhale 1 puff into the lungs daily. 120 each 1   Multiple Vitamins-Minerals (CERTAVITE/ANTIOXIDANTS) TABS Take 1 Tablet BY MOUTH EVERY DAY for 30 days     nebivolol (BYSTOLIC) 5 MG tablet TAKE 1 TABLET(5 MG) BY MOUTH DAILY 90 tablet 0   spironolactone (ALDACTONE) 25 MG tablet Take 1 tablet (25 mg total) by mouth daily. 90 tablet 3   thiamine (VITAMIN B-1) 100 MG tablet Take 1 Tablet BY MOUTH TWICE DAILY for 14 days 90 tablet 1   torsemide (DEMADEX) 20 MG tablet TAKE 1 TABLET(20 MG) BY MOUTH DAILY 90 tablet 0   No current facility-administered medications for this visit.    Allergies:   Lisinopril and Penicillins    Social History:  The patient  reports that he has been smoking cigarettes. He has a 25.00 pack-year smoking history. He has never used smokeless tobacco. He reports current alcohol use of about 56.0 standard drinks per week. He reports that he does not use drugs.   Family History:  The patient's family history includes Alcohol abuse in his maternal uncle; Colon cancer in his father; Dementia in his mother; Emphysema in his paternal grandmother; Heart failure in his paternal grandmother; Hyperlipidemia in his father; Lung cancer in his maternal uncle; Stomach cancer in his father.    ROS:  Please see the history of present illness.   Otherwise, review of systems are positive for none.   All other systems are reviewed and negative.    PHYSICAL EXAM: VS:  BP (!) 150/80 (BP Location: Right Arm, Patient Position: Sitting, Cuff Size: Normal)   Pulse 92   Ht 5\' 8"  (1.727 m)   Wt 182 lb 9.6 oz (82.8 kg)   SpO2 97%   BMI 27.76 kg/m  , BMI Body  mass index is 27.76 kg/m. GEN: Well nourished, well developed, in no acute distress HEENT: sclera anicteric Neck: no JVD, carotid bruits, or masses Cardiac: RRR; no murmurs, rubs, or gallops, no edema  Respiratory:  clear to auscultation bilaterally, normal work of breathing GI: soft, protuberant,  nontender, + BS MS: no deformity or atrophy Skin: warm and dry, no rash Neuro:  Strength and sensation are intact Psych: euthymic mood, full affect   EKG:  EKG is ordered today. The ekg ordered today demonstrates sinus rhythm, rate 92 bpm, RBBB, no STE/D, no significant change from previous   Recent Labs: 04/04/2021: ALT 102; BUN 8; Creatinine, Ser 0.80; Hemoglobin 17.0; Platelets 241.0; Potassium 4.5; Sodium 131; TSH 0.73    Lipid Panel    Component Value Date/Time   CHOL 190 04/04/2021 1209   TRIG 160.0 (H) 04/04/2021 1209  HDL 66.40 04/04/2021 1209   CHOLHDL 3 04/04/2021 1209   VLDL 32.0 04/04/2021 1209   LDLCALC 91 04/04/2021 1209   LDLCALC 65 04/15/2020 1604   LDLDIRECT 154.1 06/03/2010 1421      Wt Readings from Last 3 Encounters:  07/18/21 182 lb 9.6 oz (82.8 kg)  04/04/21 174 lb (78.9 kg)  07/07/20 160 lb (72.6 kg)      Other studies Reviewed: Additional studies/ records that were reviewed today include:   Echocardiogram 2019:  - Left ventricle: The cavity size was normal. Wall thickness was    increased in a pattern of mild LVH. Systolic function was    moderately reduced. The estimated ejection fraction was in the    range of 35% to 40%. There is akinesis of the    mid-apicalanteroseptal, anterior, anterolateral, lateral,    inferolateral, and apical myocardium. Doppler parameters are    consistent with abnormal left ventricular relaxation (grade 1    diastolic dysfunction).   [facility] 2019: Widely patent left main. Segmental 50% LAD narrowing.  Large first diagonal with 70% stenosis in the mid to distal vessel. Large normal ramus intermedius Circumflex  coronary gives origin to 3 tiny obtuse marginal branches.  50-60 % stenosis noted in the mid body of the circumflex. Dominant right coronary with luminal irregularities. Apical ballooning identified on left ventriculography consistent with Takotsubo cardiomyopathy Severe left ventricular systolic dysfunction with EF less than 25% and LVEDP 24 mmHg Cardiogenic shock on presentation now improving and patient is off pressor therapy.   RECOMMENDATIONS:   Convert heparin to DVT prophylaxis. Renin-angiotensin and sympathetic nervous system blockade as tolerated by blood pressure to facilitate LV recovery. Diuresis as needed to prevent pulmonary congestion. Alcohol cessation   Recommend Aspirin 81mg  daily for moderate CAD.   Diagnostic Dominance: Right  ASSESSMENT AND PLAN:  1. Chronic combined CHF/NICM: diagnosed in 2019 with EF 35-40% on echo and noted to be 25% on LHC with LV gram the following day. He was lost to cardiology follow-up since 10/2018. He has been on intermittent GDMT, most recently on nebivolol, azilsartan, and torsemide  - Will update an echocardiogram to re-evaluate LV function - Continue nebivolol and edarbi for now - if EF remains low on repeat echo, would consider transition from edarbi to entresto - Will add spironolactone today - Continue torsemide - Encourage daily weights and low salt diet  2. Mild-moderate non-obstructive CAD: noted on cath at the time of CHF diagnosis in 2019 with highest degree of stenosis being 70% D1 lesion, otherwise 50% LAD, 50-60% mLCx, and 20% RCA stenosis.  - Will start aspirin 81mg  daily - Will follow-up echocardiogram results - if EF remains low will consider repeat ischemic testing  - Continue aggressive risk factor modifications including tobacco cessation and maintaining goal BP <130/80, LDL <70, and A1C <7  3. HTN: BP 150/80 today - Continue nebivolol and azilsartan (edarbi) Will start spironolactone 25mg  daily for improved BP  control in patient with history of EF <40%. Will check BMET in 2 weeks for close monitoring of elecrtolyes/kidney function  4. HLD: LDL 91 on lipids 03/2021 though elevated LFTs limits titration of crestor - Continue crestor  5. COPD: on trelegy. Still smoking. Unclear how much this is contributing to his DOE.  - Continue management per PCP  - May benefit from pulm referral   6. Tobacco abuse: smoking 1-2 ppd. We discussed health risks with ongoing use. Cessation advised - Continue to encourage cessation  7. ETOH abuse: still  drinking anywhere from 1/2-3/4 of a bottler of wine or 12 beers a day. We discussed health risks of ongoing use  - Cessation encouraged.   8. Suspected OSA: patient with snoring, unrestful sleep, and large neck girth. Epworth score is 10 - Will check a sleep study to r/o OSA   Current medicines are reviewed at length with the patient today.  The patient does not have concerns regarding medicines.  The following changes have been made:  As above  Labs/ tests ordered today include:   Orders Placed This Encounter  Procedures   Basic metabolic panel   EKG 72-WTKT   ECHOCARDIOGRAM COMPLETE   Split night study     Disposition:   FU with Dr. Percival Spanish in 2-3 months  Signed, Abigail Butts, PA-C  07/21/2021 10:49 AM

## 2021-07-20 ENCOUNTER — Telehealth: Payer: Self-pay | Admitting: *Deleted

## 2021-07-20 NOTE — Telephone Encounter (Signed)
Left sleep study appointment on patient's voicemail.

## 2021-07-21 ENCOUNTER — Ambulatory Visit (INDEPENDENT_AMBULATORY_CARE_PROVIDER_SITE_OTHER): Payer: Medicare Other | Admitting: Physician Assistant

## 2021-07-21 ENCOUNTER — Other Ambulatory Visit: Payer: Self-pay

## 2021-07-21 ENCOUNTER — Encounter: Payer: Self-pay | Admitting: Physician Assistant

## 2021-07-21 DIAGNOSIS — L720 Epidermal cyst: Secondary | ICD-10-CM | POA: Diagnosis not present

## 2021-07-21 DIAGNOSIS — Z86018 Personal history of other benign neoplasm: Secondary | ICD-10-CM

## 2021-07-21 DIAGNOSIS — Z85828 Personal history of other malignant neoplasm of skin: Secondary | ICD-10-CM

## 2021-07-21 DIAGNOSIS — L729 Follicular cyst of the skin and subcutaneous tissue, unspecified: Secondary | ICD-10-CM | POA: Diagnosis not present

## 2021-07-21 DIAGNOSIS — B078 Other viral warts: Secondary | ICD-10-CM | POA: Diagnosis not present

## 2021-07-21 DIAGNOSIS — I2510 Atherosclerotic heart disease of native coronary artery without angina pectoris: Secondary | ICD-10-CM | POA: Diagnosis not present

## 2021-07-22 ENCOUNTER — Encounter: Payer: Self-pay | Admitting: Physician Assistant

## 2021-07-22 NOTE — Progress Notes (Addendum)
   Follow-Up Visit   Subjective  Oscar Phillips is a 65 y.o. male who presents for the following: Skin Problem (Patient here today for recurrent lesion on right chin that was biopsied on 07/08/2020 and it was diagnosed as a wart. Patient also has a lesion on his right forehead x years no bleeding drained a small amount of white pus. Personal history of atypical moles and non mole skin cancer. No family history of atypical mole, melanoma or non mole skin cancer. ).   The following portions of the chart were reviewed this encounter and updated as appropriate:  Tobacco  Allergies  Meds  Problems  Med Hx  Surg Hx  Fam Hx      Objective  Well appearing patient in no apparent distress; mood and affect are within normal limits.  A focused examination was performed including face and back. Relevant physical exam findings are noted in the Assessment and Plan.  Right Chin Verrucous papules -- Discussed viral etiology and contagion.    Right Forehead Small dense nodule.   Assessment & Plan  Other viral warts Right Chin  Destruction of lesion - Right Chin Complexity: simple   Destruction method: cryotherapy   Informed consent: discussed and consent obtained   Timeout:  patient name, date of birth, surgical site, and procedure verified Lesion destroyed using liquid nitrogen: Yes   Cryotherapy cycles:  3 Outcome: patient tolerated procedure well with no complications   Post-procedure details: wound care instructions given    [place] Right Forehead  Incision and Drainage - Right Forehead Location: right forehead   Informed Consent: Discussed risks (permanent scarring, light or dark discoloration, infection, pain, bleeding, bruising, redness, damage to adjacent structures, and recurrence of the lesion) and benefits of the procedure, as well as the alternatives.  Informed consent was obtained.  Preparation: The area was prepped with alcohol.  Anesthesia: Lidocaine 2% with  epinephrine  Procedure Details: An incision was made overlying the lesion. The lesion drained white keratin material.  Modest amount.  Antibiotic ointment and a sterile pressure dressing were applied. The patient tolerated procedure well.  Total number of lesions drained: 1  Plan: The patient was instructed on post-op care. Recommend OTC analgesia as needed for pain.    I, Emmalina Espericueta, PA-C, have reviewed all documentation's for this visit.  The documentation on 09/13/21 for the exam, diagnosis, procedures and orders are all accurate and complete.

## 2021-07-27 ENCOUNTER — Other Ambulatory Visit (HOSPITAL_BASED_OUTPATIENT_CLINIC_OR_DEPARTMENT_OTHER): Payer: Medicare Other

## 2021-08-05 ENCOUNTER — Other Ambulatory Visit: Payer: Self-pay | Admitting: Internal Medicine

## 2021-08-05 DIAGNOSIS — J449 Chronic obstructive pulmonary disease, unspecified: Secondary | ICD-10-CM

## 2021-08-10 ENCOUNTER — Telehealth (HOSPITAL_BASED_OUTPATIENT_CLINIC_OR_DEPARTMENT_OTHER): Payer: Self-pay | Admitting: Medical

## 2021-08-10 NOTE — Telephone Encounter (Signed)
Left message 10/2/022--08/03/21 and 08/10/21 for patient to call and discuss rescheduling the Echocardiogram that was ordered by Roby Lofts, PA

## 2021-08-12 ENCOUNTER — Encounter (HOSPITAL_BASED_OUTPATIENT_CLINIC_OR_DEPARTMENT_OTHER): Payer: Self-pay | Admitting: Medical

## 2021-08-31 ENCOUNTER — Inpatient Hospital Stay (HOSPITAL_COMMUNITY)
Admission: EM | Admit: 2021-08-31 | Discharge: 2021-09-03 | DRG: 640 | Disposition: A | Discharge status: Home / Self Care | Payer: Medicare Other | Attending: Internal Medicine | Admitting: Internal Medicine

## 2021-08-31 ENCOUNTER — Encounter (HOSPITAL_COMMUNITY): Payer: Self-pay | Admitting: Emergency Medicine

## 2021-08-31 ENCOUNTER — Emergency Department (HOSPITAL_COMMUNITY): Payer: Medicare Other

## 2021-08-31 ENCOUNTER — Other Ambulatory Visit: Payer: Self-pay

## 2021-08-31 ENCOUNTER — Telehealth: Payer: Self-pay | Admitting: Internal Medicine

## 2021-08-31 DIAGNOSIS — J449 Chronic obstructive pulmonary disease, unspecified: Secondary | ICD-10-CM | POA: Diagnosis present

## 2021-08-31 DIAGNOSIS — I5022 Chronic systolic (congestive) heart failure: Secondary | ICD-10-CM | POA: Diagnosis present

## 2021-08-31 DIAGNOSIS — Z91199 Patient's noncompliance with other medical treatment and regimen due to unspecified reason: Secondary | ICD-10-CM

## 2021-08-31 DIAGNOSIS — Z811 Family history of alcohol abuse and dependence: Secondary | ICD-10-CM | POA: Diagnosis not present

## 2021-08-31 DIAGNOSIS — D649 Anemia, unspecified: Secondary | ICD-10-CM | POA: Diagnosis present

## 2021-08-31 DIAGNOSIS — G9341 Metabolic encephalopathy: Secondary | ICD-10-CM | POA: Diagnosis present

## 2021-08-31 DIAGNOSIS — Z20822 Contact with and (suspected) exposure to covid-19: Secondary | ICD-10-CM | POA: Diagnosis present

## 2021-08-31 DIAGNOSIS — F1010 Alcohol abuse, uncomplicated: Secondary | ICD-10-CM | POA: Diagnosis not present

## 2021-08-31 DIAGNOSIS — Z79899 Other long term (current) drug therapy: Secondary | ICD-10-CM | POA: Diagnosis not present

## 2021-08-31 DIAGNOSIS — Z888 Allergy status to other drugs, medicaments and biological substances status: Secondary | ICD-10-CM

## 2021-08-31 DIAGNOSIS — F419 Anxiety disorder, unspecified: Secondary | ICD-10-CM | POA: Diagnosis present

## 2021-08-31 DIAGNOSIS — Z85828 Personal history of other malignant neoplasm of skin: Secondary | ICD-10-CM

## 2021-08-31 DIAGNOSIS — E538 Deficiency of other specified B group vitamins: Secondary | ICD-10-CM | POA: Diagnosis present

## 2021-08-31 DIAGNOSIS — Y900 Blood alcohol level of less than 20 mg/100 ml: Secondary | ICD-10-CM | POA: Diagnosis present

## 2021-08-31 DIAGNOSIS — Z83438 Family history of other disorder of lipoprotein metabolism and other lipidemia: Secondary | ICD-10-CM | POA: Diagnosis not present

## 2021-08-31 DIAGNOSIS — F17200 Nicotine dependence, unspecified, uncomplicated: Secondary | ICD-10-CM | POA: Diagnosis present

## 2021-08-31 DIAGNOSIS — E785 Hyperlipidemia, unspecified: Secondary | ICD-10-CM | POA: Diagnosis present

## 2021-08-31 DIAGNOSIS — K219 Gastro-esophageal reflux disease without esophagitis: Secondary | ICD-10-CM | POA: Diagnosis present

## 2021-08-31 DIAGNOSIS — F17210 Nicotine dependence, cigarettes, uncomplicated: Secondary | ICD-10-CM | POA: Diagnosis present

## 2021-08-31 DIAGNOSIS — I110 Hypertensive heart disease with heart failure: Secondary | ICD-10-CM | POA: Diagnosis present

## 2021-08-31 DIAGNOSIS — I2510 Atherosclerotic heart disease of native coronary artery without angina pectoris: Secondary | ICD-10-CM | POA: Diagnosis present

## 2021-08-31 DIAGNOSIS — E871 Hypo-osmolality and hyponatremia: Secondary | ICD-10-CM | POA: Diagnosis present

## 2021-08-31 DIAGNOSIS — Z880 Allergy status to penicillin: Secondary | ICD-10-CM | POA: Diagnosis not present

## 2021-08-31 DIAGNOSIS — Z825 Family history of asthma and other chronic lower respiratory diseases: Secondary | ICD-10-CM | POA: Diagnosis not present

## 2021-08-31 DIAGNOSIS — F10239 Alcohol dependence with withdrawal, unspecified: Secondary | ICD-10-CM | POA: Diagnosis present

## 2021-08-31 DIAGNOSIS — F1027 Alcohol dependence with alcohol-induced persisting dementia: Secondary | ICD-10-CM | POA: Diagnosis present

## 2021-08-31 DIAGNOSIS — I428 Other cardiomyopathies: Secondary | ICD-10-CM | POA: Diagnosis not present

## 2021-08-31 DIAGNOSIS — I10 Essential (primary) hypertension: Secondary | ICD-10-CM | POA: Diagnosis present

## 2021-08-31 DIAGNOSIS — F429 Obsessive-compulsive disorder, unspecified: Secondary | ICD-10-CM | POA: Diagnosis present

## 2021-08-31 LAB — CBC WITH DIFFERENTIAL/PLATELET
Abs Immature Granulocytes: 0.05 10*3/uL (ref 0.00–0.07)
Basophils Absolute: 0 10*3/uL (ref 0.0–0.1)
Basophils Relative: 0 %
Eosinophils Absolute: 0 10*3/uL (ref 0.0–0.5)
Eosinophils Relative: 0 %
HCT: 34 % — ABNORMAL LOW (ref 39.0–52.0)
Hemoglobin: 12.6 g/dL — ABNORMAL LOW (ref 13.0–17.0)
Immature Granulocytes: 0 %
Lymphocytes Relative: 16 %
Lymphs Abs: 1.9 10*3/uL (ref 0.7–4.0)
MCH: 34 pg (ref 26.0–34.0)
MCHC: 37.1 g/dL — ABNORMAL HIGH (ref 30.0–36.0)
MCV: 91.6 fL (ref 80.0–100.0)
Monocytes Absolute: 0.6 10*3/uL (ref 0.1–1.0)
Monocytes Relative: 5 %
Neutro Abs: 8.9 10*3/uL — ABNORMAL HIGH (ref 1.7–7.7)
Neutrophils Relative %: 79 %
Platelets: 387 10*3/uL (ref 150–400)
RBC: 3.71 MIL/uL — ABNORMAL LOW (ref 4.22–5.81)
RDW: 11.9 % (ref 11.5–15.5)
WBC: 11.4 10*3/uL — ABNORMAL HIGH (ref 4.0–10.5)
nRBC: 0 % (ref 0.0–0.2)

## 2021-08-31 LAB — RENAL FUNCTION PANEL
Albumin: 3.6 g/dL (ref 3.5–5.0)
Anion gap: 11 (ref 5–15)
BUN: 10 mg/dL (ref 8–23)
CO2: 20 mmol/L — ABNORMAL LOW (ref 22–32)
Calcium: 8.5 mg/dL — ABNORMAL LOW (ref 8.9–10.3)
Chloride: 89 mmol/L — ABNORMAL LOW (ref 98–111)
Creatinine, Ser: 0.7 mg/dL (ref 0.61–1.24)
GFR, Estimated: >60 mL/min (ref >60)
Glucose, Bld: 81 mg/dL (ref 70–99)
Phosphorus: 3.3 mg/dL (ref 2.5–4.6)
Potassium: 4.2 mmol/L (ref 3.5–5.1)
Sodium: 120 mmol/L — ABNORMAL LOW (ref 135–145)

## 2021-08-31 LAB — COMPREHENSIVE METABOLIC PANEL
ALT: 42 U/L (ref 0–44)
AST: 42 U/L — ABNORMAL HIGH (ref 15–41)
Albumin: 3.8 g/dL (ref 3.5–5.0)
Alkaline Phosphatase: 66 U/L (ref 38–126)
Anion gap: 14 (ref 5–15)
BUN: 10 mg/dL (ref 8–23)
CO2: 18 mmol/L — ABNORMAL LOW (ref 22–32)
Calcium: 8.6 mg/dL — ABNORMAL LOW (ref 8.9–10.3)
Chloride: 84 mmol/L — ABNORMAL LOW (ref 98–111)
Creatinine, Ser: 0.76 mg/dL (ref 0.61–1.24)
GFR, Estimated: >60 mL/min (ref >60)
Glucose, Bld: 74 mg/dL (ref 70–99)
Potassium: 4.6 mmol/L (ref 3.5–5.1)
Sodium: 116 mmol/L — CL (ref 135–145)
Total Bilirubin: 0.9 mg/dL (ref 0.3–1.2)
Total Protein: 7.2 g/dL (ref 6.5–8.1)

## 2021-08-31 LAB — BASIC METABOLIC PANEL
Anion gap: 11 (ref 5–15)
Anion gap: 12 (ref 5–15)
BUN: 10 mg/dL (ref 8–23)
BUN: 9 mg/dL (ref 8–23)
CO2: 20 mmol/L — ABNORMAL LOW (ref 22–32)
CO2: 20 mmol/L — ABNORMAL LOW (ref 22–32)
Calcium: 8.4 mg/dL — ABNORMAL LOW (ref 8.9–10.3)
Calcium: 8.8 mg/dL — ABNORMAL LOW (ref 8.9–10.3)
Chloride: 84 mmol/L — ABNORMAL LOW (ref 98–111)
Chloride: 86 mmol/L — ABNORMAL LOW (ref 98–111)
Creatinine, Ser: 0.75 mg/dL (ref 0.61–1.24)
Creatinine, Ser: 0.73 mg/dL (ref 0.61–1.24)
GFR, Estimated: >60 mL/min (ref >60)
GFR, Estimated: >60 mL/min (ref >60)
Glucose, Bld: 74 mg/dL (ref 70–99)
Glucose, Bld: 87 mg/dL (ref 70–99)
Potassium: 4.5 mmol/L (ref 3.5–5.1)
Potassium: 4.5 mmol/L (ref 3.5–5.1)
Sodium: 115 mmol/L — CL (ref 135–145)
Sodium: 118 mmol/L — CL (ref 135–145)

## 2021-08-31 LAB — URINALYSIS, ROUTINE W REFLEX MICROSCOPIC
Bacteria, UA: NONE SEEN
Bilirubin Urine: NEGATIVE
Glucose, UA: 50 mg/dL — AB
Ketones, ur: 5 mg/dL — AB
Leukocytes,Ua: NEGATIVE
Nitrite: NEGATIVE
Protein, ur: NEGATIVE mg/dL
Specific Gravity, Urine: 1.004 — ABNORMAL LOW (ref 1.005–1.030)
pH: 6 (ref 5.0–8.0)

## 2021-08-31 LAB — RESP PANEL BY RT-PCR (FLU A&B, COVID) ARPGX2
Influenza A by PCR: NEGATIVE
Influenza B by PCR: NEGATIVE
SARS Coronavirus 2 by RT PCR: NEGATIVE

## 2021-08-31 LAB — MAGNESIUM: Magnesium: 2 mg/dL (ref 1.7–2.4)

## 2021-08-31 LAB — SODIUM, URINE, RANDOM: Sodium, Ur: <10 mmol/L

## 2021-08-31 LAB — TSH: TSH: 0.774 u[IU]/mL (ref 0.350–4.500)

## 2021-08-31 LAB — OSMOLALITY, URINE: Osmolality, Ur: 165 mOsm/kg — ABNORMAL LOW (ref 300–900)

## 2021-08-31 LAB — ETHANOL: Alcohol, Ethyl (B): 85 mg/dL — ABNORMAL HIGH (ref <10)

## 2021-08-31 LAB — MRSA NEXT GEN BY PCR, NASAL: MRSA by PCR Next Gen: NOT DETECTED

## 2021-08-31 LAB — RAPID URINE DRUG SCREEN, HOSP PERFORMED
Amphetamines: NOT DETECTED
Barbiturates: NOT DETECTED
Benzodiazepines: NOT DETECTED
Cocaine: NOT DETECTED
Opiates: NOT DETECTED
Tetrahydrocannabinol: NOT DETECTED

## 2021-08-31 LAB — OSMOLALITY: Osmolality: 261 mOsm/kg — ABNORMAL LOW (ref 275–295)

## 2021-08-31 LAB — CBG MONITORING, ED: Glucose-Capillary: 78 mg/dL (ref 70–99)

## 2021-08-31 LAB — AMMONIA: Ammonia: 17 umol/L (ref 9–35)

## 2021-08-31 MED ORDER — ACETAMINOPHEN 650 MG RE SUPP: 650.0000 mg | Freq: Four times a day (QID) | RECTAL | Status: DC | PRN

## 2021-08-31 MED ORDER — THIAMINE HCL 100 MG/ML IJ SOLN
100.0000 mg | Freq: Every day | INTRAMUSCULAR | Status: DC
  Filled 2021-08-31: qty 2

## 2021-08-31 MED ORDER — ADULT MULTIVITAMIN W/MINERALS CH
1.0000 | ORAL_TABLET | Freq: Every day | ORAL | Status: DC
  Administered 2021-09-01 – 2021-09-03 (×3): 1 via ORAL
  Filled 2021-08-31 (×3): qty 1

## 2021-08-31 MED ORDER — LORAZEPAM 2 MG/ML IJ SOLN
1.0000 mg | INTRAMUSCULAR | Status: DC | PRN
  Administered 2021-08-31: 1 mg via INTRAVENOUS
  Administered 2021-09-01: 2 mg via INTRAVENOUS
  Filled 2021-08-31 (×2): qty 1

## 2021-08-31 MED ORDER — LORAZEPAM 1 MG PO TABS
0.0000 mg | ORAL_TABLET | ORAL | Status: AC
  Administered 2021-09-01 (×2): 1 mg via ORAL
  Administered 2021-09-01: 2 mg via ORAL
  Administered 2021-09-01 – 2021-09-02 (×6): 1 mg via ORAL
  Filled 2021-08-31 (×2): qty 1
  Filled 2021-08-31: qty 2
  Filled 2021-08-31 (×6): qty 1

## 2021-08-31 MED ORDER — NICOTINE 14 MG/24HR TD PT24
14.0000 mg | MEDICATED_PATCH | Freq: Every day | TRANSDERMAL | Status: DC
  Administered 2021-08-31 – 2021-09-03 (×4): 14 mg via TRANSDERMAL
  Filled 2021-08-31 (×4): qty 1

## 2021-08-31 MED ORDER — LACTATED RINGERS IV BOLUS
1000.0000 mL | Freq: Once | INTRAVENOUS | Status: AC
  Administered 2021-08-31: 1000 mL via INTRAVENOUS

## 2021-08-31 MED ORDER — ONDANSETRON HCL 4 MG PO TABS: 4.0000 mg | ORAL_TABLET | Freq: Four times a day (QID) | ORAL | Status: DC | PRN

## 2021-08-31 MED ORDER — SODIUM CHLORIDE 3 % IV BOLUS
150.0000 mL | Freq: Once | INTRAVENOUS | Status: DC
  Filled 2021-08-31 (×2): qty 500

## 2021-08-31 MED ORDER — THIAMINE HCL 100 MG PO TABS
100.0000 mg | ORAL_TABLET | Freq: Every day | ORAL | Status: DC
  Administered 2021-09-01 – 2021-09-03 (×3): 100 mg via ORAL
  Filled 2021-08-31 (×3): qty 1

## 2021-08-31 MED ORDER — ALBUTEROL SULFATE (2.5 MG/3ML) 0.083% IN NEBU: 2.5000 mg | INHALATION_SOLUTION | Freq: Four times a day (QID) | RESPIRATORY_TRACT | Status: DC | PRN

## 2021-08-31 MED ORDER — FLUTICASONE-UMECLIDIN-VILANT 100-62.5-25 MCG/ACT IN AEPB: 1.0000 | INHALATION_SPRAY | Freq: Every day | RESPIRATORY_TRACT | Status: DC

## 2021-08-31 MED ORDER — HYDRALAZINE HCL 20 MG/ML IJ SOLN: 10.0000 mg | Freq: Three times a day (TID) | INTRAMUSCULAR | Status: DC | PRN

## 2021-08-31 MED ORDER — FLUTICASONE FUROATE-VILANTEROL 100-25 MCG/ACT IN AEPB
1.0000 | INHALATION_SPRAY | Freq: Every day | RESPIRATORY_TRACT | Status: DC
  Administered 2021-09-01 – 2021-09-03 (×3): 1 via RESPIRATORY_TRACT
  Filled 2021-08-31: qty 28

## 2021-08-31 MED ORDER — ONDANSETRON HCL 4 MG/2ML IJ SOLN: 4.0000 mg | Freq: Four times a day (QID) | INTRAMUSCULAR | Status: DC | PRN

## 2021-08-31 MED ORDER — UMECLIDINIUM BROMIDE 62.5 MCG/ACT IN AEPB
1.0000 | INHALATION_SPRAY | Freq: Every day | RESPIRATORY_TRACT | Status: DC
  Administered 2021-09-01 – 2021-09-03 (×3): 1 via RESPIRATORY_TRACT
  Filled 2021-08-31: qty 7

## 2021-08-31 MED ORDER — THIAMINE HCL 100 MG/ML IJ SOLN
100.0000 mg | Freq: Once | INTRAMUSCULAR | Status: AC
  Administered 2021-08-31: 100 mg via INTRAVENOUS
  Filled 2021-08-31: qty 2

## 2021-08-31 MED ORDER — SODIUM CHLORIDE 3 % IV BOLUS
150.0000 mL | Freq: Once | INTRAVENOUS | Status: DC
  Filled 2021-08-31: qty 500

## 2021-08-31 MED ORDER — CHLORDIAZEPOXIDE HCL 25 MG PO CAPS
50.0000 mg | ORAL_CAPSULE | Freq: Once | ORAL | Status: AC
  Administered 2021-08-31: 50 mg via ORAL
  Filled 2021-08-31: qty 2

## 2021-08-31 MED ORDER — SODIUM CHLORIDE 0.9 % IV SOLN: INTRAVENOUS | Status: DC

## 2021-08-31 MED ORDER — LORAZEPAM 1 MG PO TABS: 1.0000 mg | ORAL_TABLET | ORAL | Status: DC | PRN

## 2021-08-31 MED ORDER — LORAZEPAM 1 MG PO TABS
0.0000 mg | ORAL_TABLET | Freq: Three times a day (TID) | ORAL | Status: DC
  Administered 2021-09-02: 1 mg via ORAL
  Filled 2021-08-31: qty 1

## 2021-08-31 MED ORDER — ACETAMINOPHEN 325 MG PO TABS: 650.0000 mg | ORAL_TABLET | Freq: Four times a day (QID) | ORAL | Status: DC | PRN

## 2021-08-31 MED ORDER — FOLIC ACID 1 MG PO TABS
1.0000 mg | ORAL_TABLET | Freq: Every day | ORAL | Status: DC
  Administered 2021-08-31 – 2021-09-03 (×4): 1 mg via ORAL
  Filled 2021-08-31 (×4): qty 1

## 2021-08-31 MED ORDER — CHLORHEXIDINE GLUCONATE CLOTH 2 % EX PADS
6.0000 | MEDICATED_PAD | Freq: Every day | CUTANEOUS | Status: DC
  Administered 2021-08-31 – 2021-09-03 (×4): 6 via TOPICAL

## 2021-08-31 MED ORDER — ENOXAPARIN SODIUM 40 MG/0.4ML IJ SOSY
40.0000 mg | PREFILLED_SYRINGE | INTRAMUSCULAR | Status: DC
  Administered 2021-09-01 – 2021-09-03 (×3): 40 mg via SUBCUTANEOUS
  Filled 2021-08-31 (×3): qty 0.4

## 2021-08-31 MED ORDER — ALBUTEROL SULFATE HFA 108 (90 BASE) MCG/ACT IN AERS: 2.0000 | INHALATION_SPRAY | Freq: Four times a day (QID) | RESPIRATORY_TRACT | Status: DC | PRN

## 2021-08-31 NOTE — ED Notes (Signed)
Critical value sodium 115, reported to Dr. Doren Custard and pts nurse

## 2021-08-31 NOTE — ED Provider Notes (Signed)
[facility] DEPT Provider Note   CSN: [id number] Arrival date & time: 08/31/21  1249     History Chief Complaint  Patient presents with   Altered Mental Status   Alcohol Intoxication    Jasper Hanf is a 65 y.o. male.  HPI Patient resents for altered mental status.  He has a history of alcoholism.  He states that he drank 4 beers today.  He lives alone and family had not seen him in 4 days.  This is typical.  When they did see him today, he was acting strangely.  He was talking nonsensically.  He was trying to get into other peoples cars in the parking lot.  EMS was called.  EMS reports that he was initially combative on scene but was able to be verbally redirected.  On arrival, patient has no current pain.  He does feel mildly short of breath.    Past Medical History:  Diagnosis Date   Abdominal bruit    Alcoholism ([place])    Anxiety    Atypical mole 11/06/2018   left post shoulder (mild)   B12 deficiency    Basal cell carcinoma 11/06/2018   right forehead bcc nod.   BCC (basal cell carcinoma of skin) 11/06/2018   left nasal ala    Bronchitis    COPD (chronic obstructive pulmonary disease) (HCC)    GERD (gastroesophageal reflux disease)    H/O trichomonal urethritis    History of panic attacks    History of rectal bleeding    in August 2016   HLD (hyperlipidemia)    HTN (hypertension)    OCD (obsessive compulsive disorder)    PSA elevation     Patient Active Problem List   Diagnosis Date Noted   Hyponatremia 08/31/2021   Multiple lung nodules on CT 32/99/2426   Alcoholic hepatitis without ascites 07/08/2020   Gastroesophageal reflux disease with esophagitis without hemorrhage 03/25/2020   Adrenal hyperplasia ([place]) 03/25/2020   Korsakoff's psychosis, 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   Coronary artery disease involving native coronary artery of native heart without angina pectoris 07/19/2018   Esophageal dysphagia 04/17/2018    PAD (peripheral artery disease) ([place]) 04/17/2018   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   GAD (generalized anxiety disorder)    MDD (major depressive disorder), recurrent episode, severe ([place]) 04/01/2018   Chronic alcohol abuse 03/02/2018   Colon cancer screening 05/24/2016   PSA elevation 08/23/2015   Routine general medical examination at a health care facility 05/20/2015   Essential hypertension 05/20/2015   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 exacerbation ([place]) 05/20/2015   Hyperlipidemia with target LDL less than 100 05/20/2015   B12 deficiency 09/21/2010   Irritable bowel syndrome 09/20/2010   TOBACCO USE 06/03/2010   GERD 06/03/2010    Past Surgical History:  Procedure Laterality Date   ELBOW FRACTURE SURGERY Left    LEFT HEART CATH AND CORONARY ANGIOGRAPHY N/A 07/04/2018   Procedure: LEFT HEART CATH AND CORONARY ANGIOGRAPHY;  Surgeon: Belva Crome, MD;  Location: [facility];  Service: Cardiovascular;  Laterality: N/A;   LUMBAR DISC SURGERY     TONSILLECTOMY  1965       Family History  Problem Relation Age of Onset   Hyperlipidemia Father    Colon cancer Father    Stomach cancer Father    Dementia Mother    Alcohol abuse Maternal Uncle    Lung cancer Maternal Uncle    Heart failure Paternal Grandmother  Emphysema Paternal Grandmother    Diabetes Neg Hx    Drug abuse Neg Hx    Early death Neg Hx    Heart disease Neg Hx    Hypertension Neg Hx    Kidney disease Neg Hx    Stroke Neg Hx    Adrenal disorder Neg Hx     Social History   Tobacco Use   Smoking status: Every Day    Packs/day: 1.00    Years: 25.00    Pack years: 25.00    Types: Cigarettes   Smokeless tobacco: Never  Vaping Use   Vaping Use: Never used  Substance Use Topics   Alcohol use: Yes    Alcohol/week: 56.0 standard drinks    Types: 56 Glasses of wine per week   Drug use: No    Home Medications Prior to Admission  medications   Medication Sig Start Date End Date Taking? Authorizing Provider  EDARBI 80 MG TABS TAKE 1 TABLET BY MOUTH ONCE DAILY Patient taking differently: Take 80 mg by mouth daily. 05/09/21  Yes Janith Lima, MD  Fluticasone-Umeclidin-Vilant (TRELEGY ELLIPTA) 100-62.5-25 MCG/ACT AEPB Inhale 1 puff into the lungs daily.   Yes [provider]  imipramine (TOFRANIL) 25 MG tablet Take 25 mg by mouth daily.   Yes [provider]  albuterol (VENTOLIN HFA) 108 (90 Base) MCG/ACT inhaler Inhale 2 puffs into the lungs every 6 (six) hours as needed for wheezing or shortness of breath. 04/04/21   Janith Lima, MD  nebivolol (BYSTOLIC) 5 MG tablet TAKE 1 TABLET(5 MG) BY MOUTH DAILY Patient taking differently: Take 5 mg by mouth daily. 07/01/21   Janith Lima, MD  spironolactone (ALDACTONE) 25 MG tablet Take 1 tablet (25 mg total) by mouth daily. 07/18/21 10/16/21  Kroeger, Lorelee Cover., PA-C  thiamine (VITAMIN B-1) 100 MG tablet Take 1 Tablet BY MOUTH TWICE DAILY for 14 days 04/04/21   Janith Lima, MD  torsemide (DEMADEX) 20 MG tablet TAKE 1 TABLET(20 MG) BY MOUTH DAILY Patient taking differently: Take 20 mg by mouth daily. 06/30/21   Janith Lima, MD  TRELEGY ELLIPTA 100-62.5-25 MCG/ACT AEPB INHALE 1 PUFF INTO THE LUNGS DAILY Patient taking differently: Inhale 1 puff into the lungs daily. 08/05/21   Janith Lima, MD    Allergies    Lisinopril and Penicillins  Review of Systems   Review of Systems  Constitutional:  Negative for activity change, chills, fatigue and fever.  HENT:  Negative for congestion, ear pain, sore throat and trouble swallowing.   Eyes:  Negative for pain and visual disturbance.  Respiratory:  Positive for shortness of breath. Negative for cough.   Cardiovascular:  Negative for chest pain and palpitations.  Gastrointestinal:  Negative for abdominal pain, diarrhea, nausea and vomiting.  Genitourinary:  Negative for dysuria, flank pain and hematuria.   Musculoskeletal:  Negative for arthralgias, back pain, myalgias and neck pain.  Skin:  Negative for color change and rash.  Neurological:  Negative for dizziness, seizures, syncope, weakness, light-headedness, numbness and headaches.  Psychiatric/Behavioral:  Positive for agitation, behavioral problems and confusion.   All other systems reviewed and are negative.  Physical Exam Updated Vital Signs BP (!) 131/57   Pulse (!) 103   Temp 97.9 F (36.6 C) (Axillary)   Resp 18   Ht 5\' 9"  (1.753 m)   Wt 74 kg   SpO2 92%   BMI 24.09 kg/m   Physical Exam Vitals and nursing note reviewed.  Constitutional:  General: He is not in acute distress.    Appearance: Normal appearance. He is well-developed and normal weight. He is not ill-appearing, toxic-appearing or diaphoretic.  HENT:     Head: Normocephalic and atraumatic.     Right Ear: External ear normal.     Left Ear: External ear normal.     Nose: Nose normal.     Mouth/Throat:     Mouth: Mucous membranes are moist.     Pharynx: Oropharynx is clear.  Eyes:     General: No scleral icterus.    Extraocular Movements: Extraocular movements intact.     Conjunctiva/sclera: Conjunctivae normal.  Cardiovascular:     Rate and Rhythm: Normal rate and regular rhythm.     Heart sounds: No murmur heard. Pulmonary:     Effort: Pulmonary effort is normal. No respiratory distress.     Breath sounds: Wheezing (Mild, end expiratory) present. No rales.  Chest:     Chest wall: No tenderness.  Abdominal:     Palpations: Abdomen is soft.     Tenderness: There is no abdominal tenderness.  Musculoskeletal:        General: No swelling, deformity or signs of injury.     Cervical back: Normal range of motion and neck supple. No rigidity.     Right lower leg: No edema.     Left lower leg: No edema.  Skin:    General: Skin is warm and dry.     Capillary Refill: Capillary refill takes less than 2 seconds.     Coloration: Skin is not jaundiced or  pale.  Neurological:     General: No focal deficit present.     Mental Status: He is alert and oriented to person, place, and time.     Cranial Nerves: No cranial nerve deficit.     Sensory: No sensory deficit.     Motor: No weakness.     Coordination: Coordination normal.  Psychiatric:        Mood and Affect: Mood normal.        Speech: Speech is not rapid and pressured, slurred or tangential.        Behavior: Behavior is not agitated or aggressive. Behavior is cooperative.        Cognition and Memory: Cognition is impaired. Memory is impaired.    ED Results / Procedures / Treatments   Labs (all labs ordered are listed, but only abnormal results are displayed) Labs Reviewed  COMPREHENSIVE METABOLIC PANEL - Abnormal; Notable for the following components:      Result Value   Sodium 116 (*)    Chloride 84 (*)    CO2 18 (*)    Calcium 8.6 (*)    AST 42 (*)    All other components within normal limits  CBC WITH DIFFERENTIAL/PLATELET - Abnormal; Notable for the following components:   WBC 11.4 (*)    RBC 3.71 (*)    Hemoglobin 12.6 (*)    HCT 34.0 (*)    MCHC 37.1 (*)    Neutro Abs 8.9 (*)    All other components within normal limits  URINALYSIS, ROUTINE W REFLEX MICROSCOPIC - Abnormal; Notable for the following components:   Color, Urine STRAW (*)    Specific Gravity, Urine 1.004 (*)    Glucose, UA 50 (*)    Hgb urine dipstick SMALL (*)    Ketones, ur 5 (*)    All other components within normal limits  ETHANOL - Abnormal; Notable for the following components:  Alcohol, Ethyl (B) 85 (*)    All other components within normal limits  OSMOLALITY - Abnormal; Notable for the following components:   Osmolality 261 (*)    All other components within normal limits  OSMOLALITY, URINE - Abnormal; Notable for the following components:   Osmolality, Ur 165 (*)    All other components within normal limits  BASIC METABOLIC PANEL - Abnormal; Notable for the following components:    Sodium 115 (*)    Chloride 84 (*)    CO2 20 (*)    Calcium 8.4 (*)    All other components within normal limits  BASIC METABOLIC PANEL - Abnormal; Notable for the following components:   Sodium 118 (*)    Chloride 86 (*)    CO2 20 (*)    Calcium 8.8 (*)    All other components within normal limits  RENAL FUNCTION PANEL - Abnormal; Notable for the following components:   Sodium 120 (*)    Chloride 89 (*)    CO2 20 (*)    Calcium 8.5 (*)    All other components within normal limits  RENAL FUNCTION PANEL - Abnormal; Notable for the following components:   Sodium 122 (*)    Chloride 92 (*)    CO2 20 (*)    Calcium 8.6 (*)    All other components within normal limits  RENAL FUNCTION PANEL - Abnormal; Notable for the following components:   Sodium 122 (*)    Chloride 94 (*)    CO2 20 (*)    Glucose, Bld 146 (*)    Calcium 8.0 (*)    Albumin 3.2 (*)    All other components within normal limits  CBC - Abnormal; Notable for the following components:   WBC 11.4 (*)    RBC 3.91 (*)    HCT 36.7 (*)    Platelets 404 (*)    All other components within normal limits  RESP PANEL BY RT-PCR (FLU A&B, COVID) ARPGX2  MRSA NEXT GEN BY PCR, NASAL  MAGNESIUM  AMMONIA  RAPID URINE DRUG SCREEN, HOSP PERFORMED  TSH  SODIUM, URINE, RANDOM  HIV ANTIBODY (ROUTINE TESTING W REFLEX)  IRON AND TIBC  VITAMIN B1  RENAL FUNCTION PANEL  RENAL FUNCTION PANEL  CBG MONITORING, ED    EKG EKG Interpretation  Date/Time:  Wednesday August 31 2021 13:17:53 EST Ventricular Rate:  88 PR Interval:  162 QRS Duration: 144 QT Interval:  377 QTC Calculation: 457 R Axis:   -25 Text Interpretation: Sinus rhythm Atrial premature complex Right bundle branch block Confirmed by Godfrey Pick ([id number]) on 08/31/2021 1:37:50 PM  Radiology CT HEAD WO CONTRAST  Result Date: 08/31/2021 CLINICAL DATA:  Altered mental status, falls EXAM: CT HEAD WITHOUT CONTRAST TECHNIQUE: Contiguous axial images were obtained from  the base of the skull through the vertex without intravenous contrast. COMPARISON:  None. FINDINGS: Brain: Periventricular white matter and corona radiata hypodensities favor chronic ischemic microvascular white matter disease. Frontal lobe confluence of white matter hypodensity noted left greater than right on image 20 of series 2, chronic remote white matter infarct not excluded. Otherwise, the brainstem, cerebellum, cerebral peduncles, thalamus, basal ganglia, basilar cisterns, and ventricular system appear within normal limits. No intracranial hemorrhage, mass lesion, or acute CVA. Vascular: There is atherosclerotic calcification of the cavernous carotid arteries bilaterally. Vertebral artery atherosclerotic calcifications noted. Skull: Unremarkable Sinuses/Orbits: Unremarkable Other: No supplemental non-categorized findings. IMPRESSION: 1. No acute intracranial findings. 2. Periventricular white matter and corona radiata hypodensities favor  chronic ischemic microvascular white matter disease. Confluent frontal lobe white matter hypodensity on the left could possibly reflect a remote white matter infarct. 3. Atherosclerosis. Electronically Signed   By: Van Clines M.D.   On: 08/31/2021 14:41    Procedures Procedures   Medications Ordered in ED Medications  LORazepam (ATIVAN) tablet 1-4 mg ( Oral See Alternative 09/01/21 0037)    Or  LORazepam (ATIVAN) injection 1-4 mg (2 mg Intravenous Given 09/01/21 0037)  thiamine tablet 100 mg (100 mg Oral Given 09/01/21 0853)    Or  thiamine (B-1) injection 100 mg ( Intravenous See Alternative 51/76/16 0737)  folic acid (FOLVITE) tablet 1 mg (1 mg Oral Given 09/01/21 0853)  multivitamin with minerals tablet 1 tablet (1 tablet Oral Given 09/01/21 0852)  LORazepam (ATIVAN) tablet 0-4 mg (2 mg Oral Given 09/01/21 0853)    Followed by  LORazepam (ATIVAN) tablet 0-4 mg (has no administration in time range)  nicotine (NICODERM CQ - dosed in mg/24 hours)  patch 14 mg (14 mg Transdermal Patch Applied 09/01/21 0855)  enoxaparin (LOVENOX) injection 40 mg (40 mg Subcutaneous Given 09/01/21 0858)  acetaminophen (TYLENOL) tablet 650 mg (has no administration in time range)    Or  acetaminophen (TYLENOL) suppository 650 mg (has no administration in time range)  ondansetron (ZOFRAN) tablet 4 mg (has no administration in time range)    Or  ondansetron (ZOFRAN) injection 4 mg (has no administration in time range)  hydrALAZINE (APRESOLINE) injection 10 mg (has no administration in time range)  0.9 %  sodium chloride infusion ( Intravenous New Bag/Given 09/01/21 0431)  Chlorhexidine Gluconate Cloth 2 % PADS 6 each (6 each Topical Given by Other 09/01/21 0913)  fluticasone furoate-vilanterol (BREO ELLIPTA) 100-25 MCG/ACT 1 puff (1 puff Inhalation Given 09/01/21 0914)    And  umeclidinium bromide (INCRUSE ELLIPTA) 62.5 MCG/ACT 1 puff (1 puff Inhalation Given 09/01/21 0914)  albuterol (PROVENTIL) (2.5 MG/3ML) 0.083% nebulizer solution 2.5 mg (has no administration in time range)  lactated ringers bolus 1,000 mL (0 mLs Intravenous Stopped 08/31/21 1400)  thiamine (B-1) injection 100 mg (100 mg Intravenous Given 08/31/21 1326)  chlordiazePOXIDE (LIBRIUM) capsule 50 mg (50 mg Oral Given 08/31/21 1601)    ED Course  I have reviewed the triage vital signs and the nursing notes.  Pertinent labs & imaging results that were available during my care of the patient were reviewed by me and considered in my medical decision making (see chart for details).    MDM Rules/Calculators/A&P                         CRITICAL CARE Performed by: Godfrey Pick   Total critical care time: 35 minutes  Critical care time was exclusive of separately billable procedures and treating other patients.  Critical care was necessary to treat or prevent imminent or life-threatening deterioration.  Critical care was time spent personally by me on the following activities: development  of treatment plan with patient and/or surrogate as well as nursing, discussions with consultants, evaluation of patient's response to treatment, examination of patient, obtaining history from patient or surrogate, ordering and performing treatments and interventions, ordering and review of laboratory studies, ordering and review of radiographic studies, pulse oximetry and re-evaluation of patient's condition.   Patient with history of alcohol abuse, presenting for altered mental status.  Prior to arrival, he was witnessed acting bizarrely.  Family states that, at baseline, he is a normal functional person.  Last known  normal was 4 days ago.  EMS reports initial agitation on scene.  He was to be able to be redirected verbally.  No medications were given prior to arrival.  On arrival, patient is cooperative.  He is unaware of the events that took place prior to arrival.  Currently, he denies any areas of pain.  He feels mildly short of breath.  There is a slight end expiratory wheeze on lung auscultation.  He has no focal neurologic deficits.  He is oriented to person place and time.  Laboratory work-up was initiated to assess for etiology of altered mental status.  Differential includes trauma, intoxication, withdrawal, and/or metabolic.  Bolus of IV fluids was ordered.  Patient was found to be hyponatremic at 116.  This is likely the primary etiology of his change in behavior.  Repeat BMP was ordered prior to hypertonic saline administration.  Patient's ethanol level was found to be 85.  Based on reports of his chronic drinking, alcohol withdrawal is possibly contributing to his current symptoms.  Librium was ordered.  CT of head showed no acute findings.  Repeat sodium was 118.  This was after bolus of IV fluids that he got on arrival.  Patient will require admission for correction of hyponatremia.  He was admitted to hospitalist for ongoing care.  Final Clinical Impression(s) / ED Diagnoses Final diagnoses:   Hyponatremia    Rx / DC Orders ED Discharge Orders     None        Godfrey Pick, MD 09/01/21 1110

## 2021-08-31 NOTE — ED Notes (Signed)
Critical value sodium 118. Dr. Doren Custard made aware as well as pts nurse

## 2021-08-31 NOTE — ED Triage Notes (Signed)
Pt BIB EMS, Found wondering in [facility] parking lot. Witnessed falling with no injury. A&O x2, with delusions and psychosis. Increased agitation at the scene. Admitted to drinking four beers today. Family reported LKW was 4 days ago.

## 2021-08-31 NOTE — ED Notes (Signed)
Patient returned back from CT. 

## 2021-08-31 NOTE — H&P (Addendum)
History and Physical    Oscar Phillips DZH:[id number] DOB: 09-17-1956 DOA: 08/31/2021  PCP: Janith Lima, MD  Patient coming from: Home  Chief Complaint: confusion  HPI: Om Oscar Phillips is a 65 y.o. male with medical history significant of EtOH abuse, HTN, HLD, COPD. Presenting with confusion. History is from sister at bedside. She reports that she spoke with the patient this morning by phone and he seemed confused. He was not making any sense with his speech and seemed agitated. She lost contact with him somehow. She called her cousin to check in on the patient. When the other family member found the patient, he was trying to get into a car that wasn't his. He seemed disoriented. So he called for EMS.   ED Course: The patient was noted to be severely hyponatremic. He was started on hypertonic saline. TRH was called for admission.   Review of Systems:  Denies CP, palpitations, dyspnea, abdominal pain, N/V/D, fevers.  Review of systems is otherwise negative for all not mentioned in HPI.   PMHx Past Medical History:  Diagnosis Date   Abdominal bruit    Alcoholism ([place])    Anxiety    Atypical mole 11/06/2018   left post shoulder (mild)   B12 deficiency    Basal cell carcinoma 11/06/2018   right forehead bcc nod.   BCC (basal cell carcinoma of skin) 11/06/2018   left nasal ala    Bronchitis    COPD (chronic obstructive pulmonary disease) (HCC)    GERD (gastroesophageal reflux disease)    H/O trichomonal urethritis    History of panic attacks    History of rectal bleeding    in August 2016   HLD (hyperlipidemia)    HTN (hypertension)    OCD (obsessive compulsive disorder)    PSA elevation     PSHx Past Surgical History:  Procedure Laterality Date   ELBOW FRACTURE SURGERY Left    LEFT HEART CATH AND CORONARY ANGIOGRAPHY N/A 07/04/2018   Procedure: LEFT HEART CATH AND CORONARY ANGIOGRAPHY;  Surgeon: Belva Crome, MD;  Location: [place] [facility];  Service:  Cardiovascular;  Laterality: N/A;   [place]    SocHx  reports that he has been smoking cigarettes. He has a 25.00 pack-year smoking history. He has never used smokeless tobacco. He reports current alcohol use of about 56.0 standard drinks per week. He reports that he does not use drugs.  Allergies  Allergen Reactions   Lisinopril Cough   Penicillins Rash    Has patient had a PCN reaction causing immediate rash, facial/tongue/throat swelling, SOB or lightheadedness with hypotension: Yes Has patient had a PCN reaction causing severe rash involving mucus membranes or skin necrosis: No Has patient had a PCN reaction that required hospitalization: Unknown Has patient had a PCN reaction occurring within the last 10 years: Unknown If all of the above answers are "NO", then may proceed with Cephalosporin use.     FamHx Family History  Problem Relation Age of Onset   Hyperlipidemia Father    Colon cancer Father    Stomach cancer Father    Dementia Mother    Alcohol abuse Maternal Uncle    Lung cancer Maternal Uncle    Heart failure Paternal Grandmother    Emphysema Paternal Grandmother    Diabetes Neg Hx    Drug abuse Neg Hx    Early death Neg Hx    Heart disease Neg Hx  Hypertension Neg Hx    Kidney disease Neg Hx    Stroke Neg Hx    Adrenal disorder Neg Hx     Prior to Admission medications   Medication Sig Start Date End Date Taking? Authorizing Provider  albuterol (VENTOLIN HFA) 108 (90 Base) MCG/ACT inhaler Inhale 2 puffs into the lungs every 6 (six) hours as needed for wheezing or shortness of breath. 04/04/21   Janith Lima, MD  EDARBI 80 MG TABS TAKE 1 TABLET BY MOUTH ONCE DAILY 05/09/21   Janith Lima, MD  Multiple Vitamins-Minerals (CERTAVITE/ANTIOXIDANTS) TABS Take 1 Tablet BY MOUTH EVERY DAY for 30 days 02/27/19   [provider]  nebivolol (BYSTOLIC) 5 MG tablet TAKE 1 TABLET(5 MG) BY MOUTH DAILY 07/01/21   Janith Lima, MD  spironolactone (ALDACTONE) 25 MG tablet Take 1 tablet (25 mg total) by mouth daily. 07/18/21 10/16/21  Kroeger, Lorelee Cover., PA-C  thiamine (VITAMIN B-1) 100 MG tablet Take 1 Tablet BY MOUTH TWICE DAILY for 14 days 04/04/21   Janith Lima, MD  torsemide (DEMADEX) 20 MG tablet TAKE 1 TABLET(20 MG) BY MOUTH DAILY 06/30/21   Janith Lima, MD  TRELEGY ELLIPTA 100-62.5-25 MCG/ACT AEPB INHALE 1 PUFF INTO THE LUNGS DAILY 08/05/21   Janith Lima, MD    Physical Exam: Vitals:   08/31/21 1319 08/31/21 1400 08/31/21 1415  BP:  126/68 (!) 149/72  Pulse:  86 90  Resp:  16 11  SpO2:  98% 97%  Weight: 72.6 kg    Height: 5\' 9"  (1.753 m)      General: 64 y.o. male resting in bed in NAD Eyes: PERRL, normal sclera ENMT: Nares patent w/o discharge, orophaynx clear, dentition normal, ears w/o discharge/lesions/ulcers Neck: Supple, trachea midline Cardiovascular: RRR, +S1, S2, no m/g/r, equal pulses throughout Respiratory: CTABL, no w/r/r, normal WOB GI: BS+, NDNT, no masses noted, no organomegaly noted MSK: No e/c/c Neuro: A&O x 3, no focal deficits Psyc: agitated,  intermittently cooperative  Labs on Admission: I have personally reviewed following labs and imaging studies  CBC: Recent Labs  Lab 08/31/21 1303  WBC 11.4*  NEUTROABS 8.9*  HGB 12.6*  HCT 34.0*  MCV 91.6  PLT 789   Basic Metabolic Panel: Recent Labs  Lab 08/31/21 1303 08/31/21 1412  NA 116* 115*  K 4.6 4.5  CL 84* 84*  CO2 18* 20*  GLUCOSE 74 74  BUN 10 10  CREATININE 0.76 0.75  CALCIUM 8.6* 8.4*  MG 2.0  --    GFR: Estimated Creatinine Clearance: 90.8 mL/min (by C-G formula based on SCr of 0.75 mg/dL). Liver Function Tests: Recent Labs  Lab 08/31/21 1303  AST 42*  ALT 42  ALKPHOS 66  BILITOT 0.9  PROT 7.2  ALBUMIN 3.8   No results for input(s): LIPASE, AMYLASE in the last 168 hours. Recent Labs  Lab 08/31/21 1303  AMMONIA 17   Coagulation Profile: No results for input(s): INR,  PROTIME in the last 168 hours. Cardiac Enzymes: No results for input(s): CKTOTAL, CKMB, CKMBINDEX, TROPONINI in the last 168 hours. BNP (last 3 results) No results for input(s): PROBNP in the last 8760 hours. HbA1C: No results for input(s): HGBA1C in the last 72 hours. CBG: Recent Labs  Lab 08/31/21 1449  GLUCAP 78   Lipid Profile: No results for input(s): CHOL, HDL, LDLCALC, TRIG, CHOLHDL, LDLDIRECT in the last 72 hours. Thyroid Function Tests: Recent Labs    08/31/21 1412  TSH 0.774   Anemia  Panel: No results for input(s): VITAMINB12, FOLATE, FERRITIN, TIBC, IRON, RETICCTPCT in the last 72 hours. Urine analysis:    Component Value Date/Time   COLORURINE STRAW (A) 08/31/2021 1303   APPEARANCEUR CLEAR 08/31/2021 1303   LABSPEC 1.004 (L) 08/31/2021 1303   PHURINE 6.0 08/31/2021 1303   GLUCOSEU 50 (A) 08/31/2021 1303   GLUCOSEU NEGATIVE 05/24/2016 1036   HGBUR SMALL (A) 08/31/2021 1303   BILIRUBINUR NEGATIVE 08/31/2021 1303   KETONESUR 5 (A) 08/31/2021 1303   PROTEINUR NEGATIVE 08/31/2021 1303   UROBILINOGEN 0.2 05/24/2016 1036   NITRITE NEGATIVE 08/31/2021 1303   LEUKOCYTESUR NEGATIVE 08/31/2021 1303    Radiological Exams on Admission: CT HEAD WO CONTRAST  Result Date: 08/31/2021 CLINICAL DATA:  Altered mental status, falls EXAM: CT HEAD WITHOUT CONTRAST TECHNIQUE: Contiguous axial images were obtained from the base of the skull through the vertex without intravenous contrast. COMPARISON:  None. FINDINGS: Brain: Periventricular white matter and corona radiata hypodensities favor chronic ischemic microvascular white matter disease. Frontal lobe confluence of white matter hypodensity noted left greater than right on image 20 of series 2, chronic remote white matter infarct not excluded. Otherwise, the brainstem, cerebellum, cerebral peduncles, thalamus, basal ganglia, basilar cisterns, and ventricular system appear within normal limits. No intracranial hemorrhage, mass  lesion, or acute CVA. Vascular: There is atherosclerotic calcification of the cavernous carotid arteries bilaterally. Vertebral artery atherosclerotic calcifications noted. Skull: Unremarkable Sinuses/Orbits: Unremarkable Other: No supplemental non-categorized findings. IMPRESSION: 1. No acute intracranial findings. 2. Periventricular white matter and corona radiata hypodensities favor chronic ischemic microvascular white matter disease. Confluent frontal lobe white matter hypodensity on the left could possibly reflect a remote white matter infarct. 3. Atherosclerosis. Electronically Signed   By: Van Clines M.D.   On: 08/31/2021 14:41    EKG: Independently reviewed. Sinus, previously seen RBBB  Assessment/Plan Severe Hyponatremia Acute metabolic encephalopathy     - admit to inpt, SDU     - CTH w/ no acute findings     - Na+ 115     - q6h renal function panels, UNa+, Uosm     - fluids; limit Na+ replacement to 8 pts/day     - mentation is improved: he's A&O x 3, but agitated, see below  EtOH abuse EtOH withdrawal     - counseled against further use     - CIWA  HTN      - non-compliant on home meds      - resume some of his home meds  Normocytic anemia      - no evidence of bleed      - check iron studies  COPD Tobacco abuse      - counseled against further use      - nicotine patch      - resume home inhalers  DVT prophylaxis: lovenox  Code Status: FULL  Family Communication: w/ sister at bedside  Consults called: None   Status is: Inpatient  Remains inpatient appropriate because: severity of illness  Jonnie Finner DO [facility]  If 7PM-7AM, please contact night-coverage [email or web address]  08/31/2021, 3:39 PM

## 2021-08-31 NOTE — Telephone Encounter (Signed)
Patient brother Herbie [place] calling in  Says patient has been acting very "weird" within the last week  Says patient has not been as talkative as her normally is & when he has been talking none of it makes sense  Wants to know what they need to do  Please call

## 2021-08-31 NOTE — ED Notes (Signed)
Patient transported to CT 

## 2021-09-01 DIAGNOSIS — F1010 Alcohol abuse, uncomplicated: Secondary | ICD-10-CM

## 2021-09-01 DIAGNOSIS — F1027 Alcohol dependence with alcohol-induced persisting dementia: Secondary | ICD-10-CM

## 2021-09-01 DIAGNOSIS — E871 Hypo-osmolality and hyponatremia: Secondary | ICD-10-CM | POA: Diagnosis not present

## 2021-09-01 DIAGNOSIS — I2510 Atherosclerotic heart disease of native coronary artery without angina pectoris: Secondary | ICD-10-CM

## 2021-09-01 LAB — RENAL FUNCTION PANEL
Albumin: 3 g/dL — ABNORMAL LOW (ref 3.5–5.0)
Albumin: 3.1 g/dL — ABNORMAL LOW (ref 3.5–5.0)
Albumin: 3.2 g/dL — ABNORMAL LOW (ref 3.5–5.0)
Albumin: 3.6 g/dL (ref 3.5–5.0)
Anion gap: 10 (ref 5–15)
Anion gap: 5 (ref 5–15)
Anion gap: 6 (ref 5–15)
Anion gap: 8 (ref 5–15)
BUN: 10 mg/dL (ref 8–23)
BUN: 12 mg/dL (ref 8–23)
BUN: 9 mg/dL (ref 8–23)
BUN: 9 mg/dL (ref 8–23)
CO2: 20 mmol/L — ABNORMAL LOW (ref 22–32)
CO2: 20 mmol/L — ABNORMAL LOW (ref 22–32)
CO2: 22 mmol/L (ref 22–32)
CO2: 23 mmol/L (ref 22–32)
Calcium: 7.9 mg/dL — ABNORMAL LOW (ref 8.9–10.3)
Calcium: 8 mg/dL — ABNORMAL LOW (ref 8.9–10.3)
Calcium: 8.3 mg/dL — ABNORMAL LOW (ref 8.9–10.3)
Calcium: 8.6 mg/dL — ABNORMAL LOW (ref 8.9–10.3)
Chloride: 92 mmol/L — ABNORMAL LOW (ref 98–111)
Chloride: 94 mmol/L — ABNORMAL LOW (ref 98–111)
Chloride: 97 mmol/L — ABNORMAL LOW (ref 98–111)
Chloride: 99 mmol/L (ref 98–111)
Creatinine, Ser: 0.66 mg/dL (ref 0.61–1.24)
Creatinine, Ser: 0.78 mg/dL (ref 0.61–1.24)
Creatinine, Ser: 0.82 mg/dL (ref 0.61–1.24)
Creatinine, Ser: 1.1 mg/dL (ref 0.61–1.24)
GFR, Estimated: >60 mL/min (ref >60)
GFR, Estimated: >60 mL/min (ref >60)
GFR, Estimated: >60 mL/min (ref >60)
GFR, Estimated: >60 mL/min (ref >60)
Glucose, Bld: 113 mg/dL — ABNORMAL HIGH (ref 70–99)
Glucose, Bld: 131 mg/dL — ABNORMAL HIGH (ref 70–99)
Glucose, Bld: 146 mg/dL — ABNORMAL HIGH (ref 70–99)
Glucose, Bld: 90 mg/dL (ref 70–99)
Phosphorus: 2.5 mg/dL (ref 2.5–4.6)
Phosphorus: 2.7 mg/dL (ref 2.5–4.6)
Phosphorus: 2.8 mg/dL (ref 2.5–4.6)
Phosphorus: 2.9 mg/dL (ref 2.5–4.6)
Potassium: 3.7 mmol/L (ref 3.5–5.1)
Potassium: 3.8 mmol/L (ref 3.5–5.1)
Potassium: 3.8 mmol/L (ref 3.5–5.1)
Potassium: 3.9 mmol/L (ref 3.5–5.1)
Sodium: 122 mmol/L — ABNORMAL LOW (ref 135–145)
Sodium: 122 mmol/L — ABNORMAL LOW (ref 135–145)
Sodium: 125 mmol/L — ABNORMAL LOW (ref 135–145)
Sodium: 127 mmol/L — ABNORMAL LOW (ref 135–145)

## 2021-09-01 LAB — IRON AND TIBC
Iron: 67 ug/dL (ref 45–182)
Saturation Ratios: 19 % (ref 17.9–39.5)
TIBC: 354 ug/dL (ref 250–450)
UIBC: 287 ug/dL

## 2021-09-01 LAB — HIV ANTIBODY (ROUTINE TESTING W REFLEX): HIV Screen 4th Generation wRfx: NONREACTIVE

## 2021-09-01 LAB — CBC
HCT: 36.7 % — ABNORMAL LOW (ref 39.0–52.0)
Hemoglobin: 13.1 g/dL (ref 13.0–17.0)
MCH: 33.5 pg (ref 26.0–34.0)
MCHC: 35.7 g/dL (ref 30.0–36.0)
MCV: 93.9 fL (ref 80.0–100.0)
Platelets: 404 10*3/uL — ABNORMAL HIGH (ref 150–400)
RBC: 3.91 MIL/uL — ABNORMAL LOW (ref 4.22–5.81)
RDW: 12.2 % (ref 11.5–15.5)
WBC: 11.4 10*3/uL — ABNORMAL HIGH (ref 4.0–10.5)
nRBC: 0 % (ref 0.0–0.2)

## 2021-09-01 MED ORDER — NEBIVOLOL HCL 5 MG PO TABS
5.0000 mg | ORAL_TABLET | Freq: Every day | ORAL | Status: DC
  Administered 2021-09-01 – 2021-09-03 (×3): 5 mg via ORAL
  Filled 2021-09-01 (×3): qty 1

## 2021-09-01 MED ORDER — FLUTICASONE-UMECLIDIN-VILANT 100-62.5-25 MCG/ACT IN AEPB: 1.0000 | INHALATION_SPRAY | Freq: Every day | RESPIRATORY_TRACT | Status: DC

## 2021-09-01 NOTE — Progress Notes (Signed)
Cigarettes and lighter removed from belongings and placed in binder, labeled with name sticker.    Pt medicines counted, itemized and brought to pharmacy.

## 2021-09-01 NOTE — Progress Notes (Signed)
Patient reports that his glasses are missing along with a book that he was reading while in the ED. After discussion with family members the book is at home and they are almost positive that the glasses are also.

## 2021-09-01 NOTE — Progress Notes (Signed)
  Progress Note    [name]   XMD:[id number]  DOB: 11-Jul-1956  DOA: 08/31/2021     1 Date of Service: 09/01/2021   Clinical Course Oscar Phillips is a 65 y.o. male with medical history significant of EtOH abuse, history of Takotsubo's cardiomyopathy, dementia thought to be associated with alcoholism, HTN, HLD, COPD adrenal hyperplasia. Presenting with confusion. History was obtained from sister at bedside. She reports that she spoke with the patient in the AM morning by phone and he seemed confused. He was not making any sense with his speech and seemed agitated. She lost contact with him somehow. She called her cousin to check in on the patient. When the other family member found the patient, he was trying to get into a car that wasn't his. He seemed disoriented and so he called EMS. In the ED he was found to have a sodium level of 116.   Assessment and Plan Principal Problem:   Hyponatremia-acute metabolic encephalopathy-prior records note that the patient has dementia as well - Sodium has improved from 115-122 - continue normal saline at 75 cc an hour and continue to follow - he seems quite oriented today  Active Problems: Chronic alcohol abuse - No signs of withdrawal as of yet-continue to follow    TOBACCO USE with COPD -Continue nicotine patch - resume Trellegy Ellipta- no wheezing noted on exam    Coronary artery disease involving native coronary artery of native heart without angina pectoris Chronic systolic CHF -Last 2D echo from 2019 revealed an EF of 35 to 40% and grade 1 diastolic dysfunction - holding Torsemide and Spironolactone at this time while administering NS infusion  HTN - resume Bystolic    Subjective:  He has no complaints today other than not being allowed to go home.  Objective Vitals:   09/01/21 0829 09/01/21 0900 09/01/21 0915 09/01/21 1000  BP:  (!) 112/51  (!) 131/57  Pulse:  (!) 104  (!) 103  Resp:  19  18  Temp: 97.9 F (36.6 C)      TempSrc: Axillary     SpO2:  96% 95% 92%  Weight:      Height:       74 kg     Exam Physical Exam General exam: Appears comfortable  HEENT: PERRLA, oral mucosa moist, no sclera icterus or thrush Respiratory system: Clear to auscultation. Respiratory effort normal. Cardiovascular system: S1 & S2 heard, regular rate and rhythm Gastrointestinal system: Abdomen soft, non-tender, nondistended. Normal bowel sounds   Central nervous system: Alert and oriented. No focal neurological deficits. Extremities: No cyanosis, clubbing or edema Skin: No rashes or ulcers Psychiatry:  Mood & affect appropriate.    Labs / Other Information Sodium  116  115    118 120  122 122  Sodium     Potassium  4.6  4.5    4.5 4.2  3.8 3.8  Potassium    Chloride  84  84    86 89  92 94  Chloride    CO2  18  20    20 20  20 20   CO2      Disposition Plan: Status is: Inpatient  Remains inpatient appropriate because: severe hyponatremia       Time spent: 35 minutes [facility] 09/01/2021, 10:58 AM

## 2021-09-01 NOTE — Plan of Care (Signed)

## 2021-09-01 NOTE — Progress Notes (Addendum)
Pt states he left glasses and a book in the ED.  Calls placed earlier in shift - items not found.  Message sent out to Primary ED RN - not found as of this time.  Charge RN aware.

## 2021-09-02 ENCOUNTER — Inpatient Hospital Stay (HOSPITAL_COMMUNITY): Payer: Medicare Other

## 2021-09-02 DIAGNOSIS — E871 Hypo-osmolality and hyponatremia: Secondary | ICD-10-CM | POA: Diagnosis not present

## 2021-09-02 DIAGNOSIS — F1027 Alcohol dependence with alcohol-induced persisting dementia: Secondary | ICD-10-CM | POA: Diagnosis not present

## 2021-09-02 DIAGNOSIS — I428 Other cardiomyopathies: Secondary | ICD-10-CM

## 2021-09-02 DIAGNOSIS — F1010 Alcohol abuse, uncomplicated: Secondary | ICD-10-CM | POA: Diagnosis not present

## 2021-09-02 LAB — ECHOCARDIOGRAM COMPLETE
Area-P 1/2: 3.08 cm2
Height: 69 in
S' Lateral: 2.7 cm
Weight: 2610.25 oz

## 2021-09-02 LAB — BASIC METABOLIC PANEL
Anion gap: 6 (ref 5–15)
BUN: 10 mg/dL (ref 8–23)
CO2: 21 mmol/L — ABNORMAL LOW (ref 22–32)
Calcium: 8.5 mg/dL — ABNORMAL LOW (ref 8.9–10.3)
Chloride: 103 mmol/L (ref 98–111)
Creatinine, Ser: 0.74 mg/dL (ref 0.61–1.24)
GFR, Estimated: >60 mL/min (ref >60)
Glucose, Bld: 112 mg/dL — ABNORMAL HIGH (ref 70–99)
Potassium: 3.7 mmol/L (ref 3.5–5.1)
Sodium: 130 mmol/L — ABNORMAL LOW (ref 135–145)

## 2021-09-02 LAB — GLUCOSE, CAPILLARY: Glucose-Capillary: 106 mg/dL — ABNORMAL HIGH (ref 70–99)

## 2021-09-02 MED ORDER — ALUM & MAG HYDROXIDE-SIMETH 200-200-20 MG/5ML PO SUSP
30.0000 mL | Freq: Four times a day (QID) | ORAL | Status: DC | PRN
  Administered 2021-09-02: 30 mL via ORAL
  Filled 2021-09-02: qty 30

## 2021-09-02 MED ORDER — MELATONIN 5 MG PO TABS
5.0000 mg | ORAL_TABLET | Freq: Once | ORAL | Status: AC
  Administered 2021-09-02: 5 mg via ORAL
  Filled 2021-09-02: qty 1

## 2021-09-02 NOTE — TOC Initial Note (Signed)
Transition of Care [facility]) - Initial/Assessment Note   Patient Details  Name: Oscar Phillips MRN: [id number] Date of Birth: 1956/10/02  Transition of Care [facility]) CM/SW Contact:    Sherie Don, LCSW Phone Number: 09/02/2021, 11:10 AM  Clinical Narrative: TOC received consult for ETOH resources. CSW spoke with patient's sister, Gamble Enderle, due to fluctuating orientation from dementia associated with alcoholism without behavioral disturbance. Sister reported the family is interested in inpatient ETOH treatment options if the patient will agree to go, but the family would also like outpatient ETOH resources if patient will not agree to inpatient. ETOH resources added to AVS.  Expected Discharge Plan: Home/Self Care Barriers to Discharge: Continued Medical Work up  Patient Goals and CMS Choice Patient states their goals for this hospitalization and ongoing recovery are:: Discharge home with substance use resources for family to review with patient CMS Medicare.gov Compare Post Acute Care list provided to:: Patient Represenative (must comment) Arlan Organ (sister)) Choice offered to / list presented to : Sibling  Expected Discharge Plan and Services Expected Discharge Plan: Home/Self Care In-house Referral: Clinical Social Work Post Acute Care Choice: NA Living arrangements for the past 2 months: Apartment            DME Arranged: N/A DME Agency: NA  Prior Living Arrangements/Services Living arrangements for the past 2 months: Apartment Patient language and need for interpreter reviewed:: Yes Do you feel safe going back to the place where you live?: Yes      Need for Family Participation in Patient Care: Yes (Comment) (Patient has intermittent confusion due to dementia associated with ETOH use.) Care giver support system in place?: Yes (comment) Criminal Activity/Legal Involvement Pertinent to Current Situation/Hospitalization: No - Comment as needed  Activities of Daily  Living Home Assistive Devices/Equipment: Eyeglasses ADL Screening (condition at time of admission) Patient's cognitive ability adequate to safely complete daily activities?: No Is the patient deaf or have difficulty hearing?: No Does the patient have difficulty seeing, even when wearing glasses/contacts?: No Does the patient have difficulty concentrating, remembering, or making decisions?: Yes Patient able to express need for assistance with ADLs?: Yes Does the patient have difficulty dressing or bathing?: Yes Independently performs ADLs?: No Communication: Independent Dressing (OT): Needs assistance Is this a change from baseline?: Change from baseline, expected to last >3 days Grooming: Needs assistance Is this a change from baseline?: Change from baseline, expected to last >3 days Feeding: Independent Bathing: Needs assistance Is this a change from baseline?: Change from baseline, expected to last >3 days Toileting: Needs assistance Is this a change from baseline?: Change from baseline, expected to last >3days In/Out Bed: Needs assistance Is this a change from baseline?: Change from baseline, expected to last >3 days Walks in Home: Needs assistance Is this a change from baseline?: Change from baseline, expected to last >3 days Does the patient have difficulty walking or climbing stairs?: Yes Weakness of Legs: Both Weakness of Arms/Hands: Both  Emotional Assessment Orientation: : Fluctuating Orientation (Suspected and/or reported Sundowners) Alcohol / Substance Use: Alcohol Use  Admission diagnosis:  Hyponatremia [E87.1] Patient Active Problem List   Diagnosis Date Noted   Hyponatremia 08/31/2021   Multiple lung nodules on CT 86/76/7209   Alcoholic hepatitis without ascites 07/08/2020   Gastroesophageal reflux disease with esophagitis without hemorrhage 03/25/2020   Adrenal hyperplasia ([place]) 03/25/2020   Korsakoff's psychosis, 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   Coronary  artery disease involving native coronary artery of native heart without angina pectoris 07/19/2018  Esophageal dysphagia 04/17/2018   PAD (peripheral artery disease) ([place]) 04/17/2018   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 ([place]) 04/17/2018   GAD (generalized anxiety disorder)    MDD (major depressive disorder), recurrent episode, severe ([place]) 04/01/2018   Chronic alcohol abuse 03/02/2018   Colon cancer screening 05/24/2016   PSA elevation 08/23/2015   Routine general medical examination at a health care facility 05/20/2015   Essential hypertension 05/20/2015   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 exacerbation ([place]) 05/20/2015   Hyperlipidemia with target LDL less than 100 05/20/2015   B12 deficiency 09/21/2010   Irritable bowel syndrome 09/20/2010   TOBACCO USE 06/03/2010   GERD 06/03/2010   PCP:  Janith Lima, MD Pharmacy:   [facility] [place], [place] [address] Phone: [phone] Fax: [phone]  Readmission Risk Interventions No flowsheet data found.

## 2021-09-02 NOTE — Discharge Instructions (Addendum)
Residential/Inpatient Substance Use Programs  [facility]  [place], [place] [address]  [phone] or [phone] Detox (Medicare, Medicaid, private insurance, and self pay)  Residential Rehab 14 days (Medicare, [place], private insurance, and self pay)   RTS (Residential Treatment Services)  [address] [place], [place]  Male and Male Detox (Self Pay and Medicaid limited availability)  Rehab only Male ([place] and self pay only)   [facility] [address]      [address]  [place], [place] [address]  [phone] or [phone] Detox and [place]  [place].  [place], [place] [address]  [phone]  Treatment Only, must make assessment appointment, and must be sober for assessment appointment.  Self Pay Only, Medicare A&B, [facility], [place] ID only! *Transportation assistance offered from [place] on [address], [place] [address] Walk in interviews M-Sat 8-4p No pending legal charges [phone]  ADATC:  [facility] Referral  [address] [place], [place] (Self Pay, [facility])  [facility] [address] [place], [place] [address] [phone] Detox and Residential Treatment Medicare and [place].  [place], [place] [address] [place]: [place]: [phone] Long-term Residential Program:  [phone] Males 25 and Over (No Insurance, upfront fee)  [place], [place] [address] [phone] Private Insurance with [place], [place], [place] [address] Local ([place].  [place], [place] [address]  [phone] (Males, upfront fee)  [place], [place] Private Insurance   Outpatient/Community-based Substance Use [facility] [address] [place], [place] [address]  [phone] Private insurance, Medicare A&B, and [facility]   ADS (Alcohol and Drug Services)  [address]  [place], [place] [address] [phone] Medicaid, [place] [address]  [place], [place] Medicaid and [facility], Self Pay   [facility] [address]  [place], [place] [facility], and Self Pay  [facility] [place]    [place], [place] [address]  [phone] or [phone] Private Insurance Only   [facility] [place] [address]  [place], [place] [phone] Medicaid, Medicare, [place] at the [facility] [address] [place], [place] [address] [phone] Services are free or reduced  [facility]  [address] [phone]  Self Pay only, sliding scale  2 D ECHO was completed. It appears that your heart failure has improved and you should not need any more Spironolactone or Torsemide at this point.     [place]  [place], [place] [address] [phone] ([facility]) Self Pay, Medicaid Only   [facility] [place], [place] [address] [phone] Medicaid, Medicare, [place] were cared for by a hospitalist during your hospital stay. If you have any questions about your discharge medications or the care you received while you were in the hospital after you  are discharged, you can call the unit and asked to speak with the hospitalist on call if the hospitalist that took care of you is  not available. Once you are discharged, your primary care physician will handle any further medical issues.   Please note that NO REFILLS for any discharge medications will be authorized once you are discharged, as it is imperative that you return to your primary care physician (or establish a relationship with a primary care physician if you do not have one) for your aftercare needs so that they can reassess your need for medications and monitor your lab values.  Please take all your medications with you for your next visit with your Primary MD. Please ask your Primary MD to get all Hospital records sent to his/her office. Please request your Primary MD to go over all hospital test results at the follow up.   If you experience worsening of your admission symptoms, develop shortness of breath, chest pain, suicidal or homicidal thoughts or a life threatening emergency, you must seek medical attention immediately by calling 911 or calling your MD.   Dennis Bast must read the complete instructions/literature along with all the possible adverse reactions/side effects for all the medicines you take including new medications that have been prescribed to you. Take new medicines after you have completely understood and accpet all the possible adverse reactions/side effects.    Do not drive when taking pain medications or sedatives.     Do not take more than prescribed Pain, Sleep and Anxiety Medications   If you have smoked or chewed Tobacco in the last 2 yrs please stop. Stop any regular alcohol  and or recreational drug use.   Wear Seat belts while driving.

## 2021-09-02 NOTE — Progress Notes (Signed)
  Echocardiogram 2D Echocardiogram has been performed.  Oscar Phillips M 09/02/2021, 11:43 AM

## 2021-09-02 NOTE — Progress Notes (Signed)
  Progress Note    Oscar Phillips   GSU:[id number]  DOB: [birth year]  DOA: 08/31/2021     2 Date of Service: 09/02/2021   Clinical Course Oscar Phillips is a 65 y.o. male with medical history significant of EtOH abuse, history of Takotsubo's cardiomyopathy, dementia thought to be associated with alcoholism, HTN, HLD, COPD adrenal hyperplasia. Presenting with confusion. History was obtained from sister at bedside. She reports that she spoke with the patient in the AM morning by phone and he seemed confused. He was not making any sense with his speech and seemed agitated. She lost contact with him somehow. She called her cousin to check in on the patient. When the other family member found the patient, he was trying to get into a car that wasn't his. He seemed disoriented and so he called EMS. In the ED he was found to have a sodium level of 116.   Assessment and Plan Principal Problem:   Hyponatremia-acute metabolic encephalopathy-prior records note that the patient has dementia as well - Sodium has improved from 115 to 130 - he is having confusion now - see below  Active Problems: ETOH withdrawal- Chronic alcohol abuse - He is having alcohol withdrawal now and receiving IV Ativan PRN    TOBACCO USE with COPD -Continue nicotine patch - resumed Trellegy Ellipta- no wheezing noted on exam    Coronary artery disease involving native coronary artery of native heart without angina pectoris Chronic systolic CHF -Last 2D echo from 2019 revealed an EF of 35 to 40% and grade 1 diastolic dysfunction - holding Torsemide and Spironolactone at this time due to hyponatremia - 2 D ECHO repeated today reveals EF of 60-65% and Grade 1 diastolic dysfunction  HTN - resumed Bystolic    Subjective:  No new complaints- restless and confused per RN  Objective Vitals:   09/02/21 1000 09/02/21 1100 09/02/21 1200 09/02/21 1330  BP: (!) 125/50 138/62 (!) 128/56   Pulse: 75 84 86 78  Resp: 15 20 19     Temp:      TempSrc:      SpO2: 94% 91% 96%   Weight:      Height:       74 kg     Exam Physical Exam  General exam: Appears comfortable  HEENT: PERRLA, oral mucosa moist, no sclera icterus or thrush Respiratory system: Clear to auscultation. Respiratory effort normal. Cardiovascular system: S1 & S2 heard, regular rate and rhythm Gastrointestinal system: Abdomen soft, non-tender, nondistended. Normal bowel sounds   Central nervous system: Alert and oriented. No focal neurological deficits. Extremities: No cyanosis, clubbing or edema Skin: No rashes or ulcers Psychiatry:  Mood & affect appropriate.    Labs / Other Information Sodium  116  115    118 120  122 122  Sodium     Potassium  4.6  4.5    4.5 4.2  3.8 3.8  Potassium    Chloride  84  84    86 89  92 94  Chloride    CO2  18  20    20 20  20 20   CO2      Disposition Plan: Status is: Inpatient  Remains inpatient appropriate because: severe hyponatremia, ETOH withdrawl       Time spent: 35 minutes [facility] 09/02/2021, 4:51 PM

## 2021-09-03 DIAGNOSIS — I2510 Atherosclerotic heart disease of native coronary artery without angina pectoris: Secondary | ICD-10-CM | POA: Diagnosis not present

## 2021-09-03 DIAGNOSIS — E871 Hypo-osmolality and hyponatremia: Secondary | ICD-10-CM | POA: Diagnosis not present

## 2021-09-03 DIAGNOSIS — F1027 Alcohol dependence with alcohol-induced persisting dementia: Secondary | ICD-10-CM | POA: Diagnosis not present

## 2021-09-03 DIAGNOSIS — F1010 Alcohol abuse, uncomplicated: Secondary | ICD-10-CM | POA: Diagnosis not present

## 2021-09-03 LAB — BASIC METABOLIC PANEL
Anion gap: 8 (ref 5–15)
Anion gap: 9 (ref 5–15)
BUN: 13 mg/dL (ref 8–23)
BUN: 13 mg/dL (ref 8–23)
CO2: 22 mmol/L (ref 22–32)
CO2: 24 mmol/L (ref 22–32)
Calcium: 8.5 mg/dL — ABNORMAL LOW (ref 8.9–10.3)
Calcium: 8.8 mg/dL — ABNORMAL LOW (ref 8.9–10.3)
Chloride: 98 mmol/L (ref 98–111)
Chloride: 100 mmol/L (ref 98–111)
Creatinine, Ser: 0.88 mg/dL (ref 0.61–1.24)
Creatinine, Ser: 0.8 mg/dL (ref 0.61–1.24)
GFR, Estimated: >60 mL/min (ref >60)
GFR, Estimated: >60 mL/min (ref >60)
Glucose, Bld: 113 mg/dL — ABNORMAL HIGH (ref 70–99)
Glucose, Bld: 142 mg/dL — ABNORMAL HIGH (ref 70–99)
Potassium: 3.8 mmol/L (ref 3.5–5.1)
Potassium: 3.9 mmol/L (ref 3.5–5.1)
Sodium: 128 mmol/L — ABNORMAL LOW (ref 135–145)
Sodium: 133 mmol/L — ABNORMAL LOW (ref 135–145)

## 2021-09-03 MED ORDER — NICOTINE 14 MG/24HR TD PT24: 14.0000 mg | MEDICATED_PATCH | Freq: Every day | TRANSDERMAL | 0 refills | Status: DC

## 2021-09-03 MED ORDER — NICOTINE 7 MG/24HR TD PT24: 7.0000 mg | MEDICATED_PATCH | Freq: Every day | TRANSDERMAL | 0 refills | Status: DC

## 2021-09-03 MED ORDER — FOLIC ACID 1 MG PO TABS
1.0000 mg | ORAL_TABLET | Freq: Every day | ORAL | 0 refills | Status: AC
Start: 2021-09-04 — End: 2021-10-04

## 2021-09-03 MED ORDER — THIAMINE HCL 100 MG PO TABS: 100.0000 mg | ORAL_TABLET | Freq: Every day | ORAL | 0 refills | Status: AC

## 2021-09-03 MED ORDER — SODIUM CHLORIDE 0.9 % IV BOLUS
500.0000 mL | Freq: Once | INTRAVENOUS | Status: AC
  Administered 2021-09-03: 500 mL via INTRAVENOUS

## 2021-09-03 NOTE — Discharge Summary (Signed)
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 male with medical history significant of EtOH abuse, history of Takotsubo's cardiomyopathy, dementia thought to be associated with alcoholism, HTN, HLD, COPD adrenal hyperplasia. Presenting with confusion. History was obtained from sister at bedside. She reports that she spoke with the patient in the AM morning by phone and he seemed confused. He was not making any sense with his speech and seemed agitated. She lost contact with him somehow. She called her cousin to check in on the patient. When the other family member found the patient, he was trying to get into a car that wasn't his. He seemed disoriented and so he called EMS. In the ED he was found to have a sodium level of 116.   Hospital Course:  Principal Problem:   Hyponatremia-- Sodium has improved from 115 to 133 with IVF and holding diuretics   Active Problems: ETOH withdrawal- Chronic alcohol abuse acute metabolic encephalopathy-prior records note that the patient has dementia as well - He has been treated for alcohol withdrawal with IV Ativan  - he had decided to go to inpatient rehab for his  alcohol abuse     TOBACCO USE with COPD -Continue nicotine patch - resumed Trellegy Ellipta- no wheezing noted on exam     Coronary artery disease involving native coronary artery of native heart without angina pectoris Chronic systolic CHF -Last 2D echo from 2019 revealed an EF of 35 to 40% and grade 1 diastolic dysfunction - holding Torsemide and Spironolactone at this time due to hyponatremia - 2 D ECHO repeated & reveals EF of 60-65% and Grade 1 diastolic dysfunction- have discontinued both Spironolactone and Torsemide   HTN - resumed Bystolic    Discharge Exam: Vitals:   09/03/21 1100 09/03/21 1200  BP: (!) 165/61   Pulse: 77   Resp: 13   Temp:  98 F (36.7 C)  SpO2: 97%    Vitals:   09/03/21 0900 09/03/21 1000 09/03/21 1100 09/03/21 1200  BP: (!) 157/64 (!) 154/58 (!) 165/61   Pulse: 74 79 77   Resp: 14 12 13    Temp:    98 F (36.7 C)  TempSrc:    Oral  SpO2: 97% 96% 97%   Weight:      Height:        General: Pt is alert, awake, not in acute distress Cardiovascular: RRR, S1/S2 +, no rubs, no gallops Respiratory: CTA bilaterally, no wheezing, no rhonchi Abdominal: Soft, NT, ND, bowel sounds + Extremities: no edema, no cyanosis   Discharge Instructions  Discharge Instructions     Diet - low sodium heart healthy   Complete by: As directed    Increase activity slowly   Complete  by: As directed       Allergies as of 09/03/2021       Reactions   Lisinopril Cough   Penicillins Rash   Has patient had a PCN reaction causing immediate rash, facial/tongue/throat swelling, SOB or lightheadedness with hypotension: Yes Has patient had a PCN reaction causing severe rash involving mucus membranes or skin necrosis: No Has patient had a PCN reaction that required hospitalization: Unknown Has patient had a PCN reaction occurring within the last 10 years: Unknown If all of the above answers are "NO", then may proceed with Cephalosporin use.        Medication  List     STOP taking these medications    chlordiazePOXIDE 25 MG capsule Commonly known as: LIBRIUM   spironolactone 25 MG tablet Commonly known as: ALDACTONE   torsemide 20 MG tablet Commonly known as: DEMADEX       TAKE these medications    albuterol 108 (90 Base) MCG/ACT inhaler Commonly known as: VENTOLIN HFA Inhale 2 puffs into the lungs every 6 (six) hours as needed for wheezing or shortness of breath.   Edarbi 80 MG Tabs Generic drug: Azilsartan Medoxomil TAKE 1 TABLET BY MOUTH ONCE DAILY What changed: how much to take   folic acid 1 MG tablet Commonly known as: FOLVITE Take 1 tablet (1 mg total) by mouth daily. Start taking on: September 04, 2021   imipramine 25 MG tablet Commonly known as: TOFRANIL Take 25 mg by mouth daily.   multivitamin with minerals Tabs tablet Take 1 tablet by mouth daily. Centrum   nebivolol 5 MG tablet Commonly known as: BYSTOLIC TAKE 1 TABLET(5 MG) BY MOUTH DAILY What changed: See the new instructions.   nicotine 7 mg/24hr patch Commonly known as: Nicoderm CQ Place 1 patch (7 mg total) onto the skin daily. Start after 14 mg patches have finished   nicotine 14 mg/24hr patch Commonly known as: NICODERM CQ - dosed in mg/24 hours Place 1 patch (14 mg total) onto the skin daily. Start taking on: September 04, 2021   thiamine 100 MG tablet Commonly known as: VITAMIN B-1 Take 1 Tablet BY MOUTH TWICE DAILY for 14 days What changed:  how much to take how to take this when to take this additional instructions   thiamine 100 MG tablet Take 1 tablet (100 mg total) by mouth daily. Start taking on: September 04, 2021   Trelegy Ellipta 100-62.5-25 MCG/ACT Aepb Generic drug: Fluticasone-Umeclidin-Vilant INHALE 1 PUFF INTO THE LUNGS DAILY   Vitamin D3 250 MCG (10000 UT) capsule Take 10,000 Units by mouth daily.        Allergies  Allergen Reactions   Lisinopril Cough   Penicillins Rash    Has patient had a PCN reaction  causing immediate rash, facial/tongue/throat swelling, SOB or lightheadedness with hypotension: Yes Has patient had a PCN reaction causing severe rash involving mucus membranes or skin necrosis: No Has patient had a PCN reaction that required hospitalization: Unknown Has patient had a PCN reaction occurring within the last 10 years: Unknown If all of the above answers are "NO", then may proceed with Cephalosporin use.       CT HEAD WO CONTRAST  Result Date: 08/31/2021 CLINICAL DATA:  Altered mental status, falls EXAM: CT HEAD WITHOUT CONTRAST TECHNIQUE: Contiguous axial images were obtained from the base of the skull through the vertex without intravenous contrast. COMPARISON:  None. FINDINGS: Brain: Periventricular white matter and corona radiata hypodensities favor chronic ischemic microvascular white matter disease. Frontal  lobe confluence of white matter hypodensity noted left greater than right on image 20 of series 2, chronic remote white matter infarct not excluded. Otherwise, the brainstem, cerebellum, cerebral peduncles, thalamus, basal ganglia, basilar cisterns, and ventricular system appear within normal limits. No intracranial hemorrhage, mass lesion, or acute CVA. Vascular: There is atherosclerotic calcification of the cavernous carotid arteries bilaterally. Vertebral artery atherosclerotic calcifications noted. Skull: Unremarkable Sinuses/Orbits: Unremarkable Other: No supplemental non-categorized findings. IMPRESSION: 1. No acute intracranial findings. 2. Periventricular white matter and corona radiata hypodensities favor chronic ischemic microvascular white matter disease. Confluent frontal lobe white matter hypodensity on the left could possibly reflect a remote white matter infarct. 3. Atherosclerosis. Electronically Signed   By: Van Clines M.D.   On: 08/31/2021 14:41   ECHOCARDIOGRAM COMPLETE  Result Date: 09/02/2021    ECHOCARDIOGRAM REPORT   Patient Name:   Oscar Phillips Date of Exam: 09/02/2021 Medical Rec #:  [id number]        Height:       69.0 in Accession #:    [id number]       Weight:       163.1 lb Date of Birth:  [birth year]        BSA:          1.895 m Patient Age:    12 years         BP:           138/62 mmHg Patient Gender: M                HR:           77 bpm. 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 COPD; Risk Factors:Hypertension, Dyslipidemia                 and Current Smoker. GERD.  Sonographer:    Darlina Sicilian RDCS Referring Phys: [place]  1. Left ventricular ejection fraction, by estimation, is 60 to 65%. The left ventricle has normal function. The left ventricle has no regional wall motion abnormalities. Left ventricular diastolic parameters are consistent with Grade I diastolic dysfunction (impaired relaxation).  2. Right ventricular systolic function is mildly reduced. The right ventricular size is mildly enlarged.  3. The mitral valve is normal in structure. No evidence of mitral valve regurgitation. No evidence of mitral stenosis.  4. The aortic valve is tricuspid. There is moderate calcification of the aortic valve. There is moderate thickening of the aortic valve. Aortic valve regurgitation is not visualized. Sclerosis without stenosis.  5. The inferior vena cava is normal in size with greater than 50% respiratory variability, suggesting right atrial pressure of 3 mmHg. FINDINGS  Left Ventricle: Left ventricular ejection fraction, by estimation, is 60 to 65%. The left ventricle has normal function. The left ventricle has no regional wall motion abnormalities. The left ventricular internal cavity size was normal in size. There is  no left ventricular hypertrophy. Left ventricular diastolic parameters are consistent with Grade I  diastolic dysfunction (impaired relaxation). Right Ventricle: The right ventricular size is mildly enlarged. No increase in right ventricular wall thickness. Right ventricular systolic function is mildly reduced. Left Atrium: Left atrial size was normal in size. Right Atrium: Right atrial size  was normal in size. Pericardium: There is no evidence of pericardial effusion. Mitral Valve: The mitral valve is normal in structure. No evidence of mitral valve regurgitation. No evidence of mitral valve stenosis. Tricuspid Valve: The tricuspid valve is normal in structure. Tricuspid valve regurgitation is not demonstrated. No evidence of tricuspid stenosis. Aortic Valve: The aortic valve is tricuspid. There is moderate calcification of the aortic valve. There is moderate thickening of the aortic valve. Aortic valve regurgitation is not visualized. Sclerosis without stenosis. Pulmonic Valve: The pulmonic valve was normal in structure. Pulmonic valve regurgitation is not visualized. No evidence of pulmonic stenosis. Aorta: The aortic root is normal in size and structure. Venous: The inferior vena cava is normal in size with greater than 50% respiratory variability, suggesting right atrial pressure of 3 mmHg. IAS/Shunts: No atrial level shunt detected by color flow Doppler.  LEFT VENTRICLE PLAX 2D LVIDd:         4.00 cm   Diastology LVIDs:         2.70 cm   LV e' medial:    9.03 cm/s LV PW:         0.90 cm   LV E/e' medial:  8.4 LV IVS:        1.00 cm   LV e' lateral:   10.20 cm/s LVOT diam:     2.20 cm   LV E/e' lateral: 7.4 LV SV:         68 LV SV Index:   36 LVOT Area:     3.80 cm  RIGHT VENTRICLE RV S prime:     13.50 cm/s TAPSE (M-mode): 2.8 cm LEFT ATRIUM           Index LA diam:      2.10 cm 1.11 cm/m LA Vol (A2C): 13.4 ml 7.07 ml/m LA Vol (A4C): 18.7 ml 9.87 ml/m  AORTIC VALVE LVOT Vmax:   94.60 cm/s LVOT Vmean:  60.800 cm/s LVOT VTI:    0.178 m  AORTA Ao Root diam: 3.60 cm MITRAL VALVE MV Area (PHT): 3.08 cm     SHUNTS MV Decel Time: 246 msec    Systemic VTI:  0.18 m MV E velocity: 75.80 cm/s  Systemic Diam: 2.20 cm MV A velocity: 93.80 cm/s MV E/A ratio:  0.81 Jenkins Rouge MD Electronically signed by Jenkins Rouge MD Signature Date/Time: 09/02/2021/12:04:51 PM    Final      The results of significant diagnostics from this hospitalization (including imaging, microbiology, ancillary and laboratory) are listed below for reference.     Microbiology: Recent Results (from the past 240 hour(s))  Resp Panel by RT-PCR (Flu A&B, Covid) Nasopharyngeal Swab     Status: None   Collection Time: 08/31/21  2:22 PM   Specimen: Nasopharyngeal Swab; Nasopharyngeal(NP) swabs in vial transport medium  Result Value Ref Range Status   SARS Coronavirus 2 by RT PCR NEGATIVE NEGATIVE Final    Comment: (NOTE) SARS-CoV-2 target nucleic acids are NOT DETECTED.  The SARS-CoV-2 RNA is generally detectable in upper respiratory specimens during the acute phase of infection. The lowest concentration of SARS-CoV-2 viral copies this assay can detect is 138 copies/mL. A negative result does not preclude SARS-Cov-2 infection and should not be used as the sole basis for treatment or other patient management decisions. A negative result may occur with  improper specimen collection/handling, submission of specimen other than nasopharyngeal swab, presence of viral mutation(s) within the areas targeted by this assay, and inadequate number of viral copies(<138 copies/mL). A  negative result must be combined with clinical observations, patient history, and epidemiological information. The expected result is Negative.  Fact Sheet for Patients:  EntrepreneurPulse.com.au  Fact Sheet for Healthcare Providers:  IncredibleEmployment.be  This test is no t yet approved or cleared by the [place] FDA and  has been authorized for detection and/or diagnosis of SARS-CoV-2 by FDA under an Emergency Use  Authorization (EUA). This EUA will remain  in effect (meaning this test can be used) for the duration of the COVID-19 declaration under Section 564(b)(1) of the Act, 21 U.S.C.section 360bbb-3(b)(1), unless the authorization is terminated  or revoked sooner.       Influenza A by PCR NEGATIVE NEGATIVE Final   Influenza B by PCR NEGATIVE NEGATIVE Final    Comment: (NOTE) The Xpert Xpress SARS-CoV-2/FLU/RSV plus assay is intended as an aid in the diagnosis of influenza from Nasopharyngeal swab specimens and should not be used as a sole basis for treatment. Nasal washings and aspirates are unacceptable for Xpert Xpress SARS-CoV-2/FLU/RSV testing.  Fact Sheet for Patients: EntrepreneurPulse.com.au  Fact Sheet for Healthcare Providers: IncredibleEmployment.be  This test is not yet approved or cleared by the [place] FDA and has been authorized for detection and/or diagnosis of SARS-CoV-2 by FDA under an Emergency Use Authorization (EUA). This EUA will remain in effect (meaning this test can be used) for the duration of the COVID-19 declaration under Section 564(b)(1) of the Act, 21 U.S.C. section 360bbb-3(b)(1), unless the authorization is terminated or revoked.  Performed at [facility], [place] [address] [place], [place] [address]   MRSA Next Gen by PCR, Nasal     Status: None   Collection Time: 08/31/21  9:20 PM   Specimen: Nasal Mucosa; Nasal Swab  Result Value Ref Range Status   MRSA by PCR Next Gen NOT DETECTED NOT DETECTED Final    Comment: (NOTE) The GeneXpert MRSA Assay (FDA approved for NASAL specimens only), is one component of a comprehensive MRSA colonization surveillance program. It is not intended to diagnose MRSA infection nor to guide or monitor treatment for MRSA infections. Test performance is not FDA approved in patients less than 63 years old. Performed at [facility], [place]  [address], [place], [place] [address]      Labs: BNP (last 3 results) No results for input(s): BNP in the last 8760 hours. Basic Metabolic Panel: Recent Labs  Lab 08/31/21 1303 08/31/21 1412 08/31/21 1946 08/31/21 2359 09/01/21 6948 09/01/21 1215 09/01/21 1620 09/02/21 0913 09/03/21 0245 09/03/21 1250  NA 116*   < > 120* 122* 122* 125* 127* 130* 128* 133*  K 4.6   < > 4.2 3.8 3.8 3.9 3.7 3.7 3.8 3.9  CL 84*   < > 89* 92* 94* 97* 99 103 98 100  CO2 18*   < > 20* 20* 20* 22 23 21* 22 24  GLUCOSE 74   < > 81 90 146* 113* 131* 112* 113* 142*  BUN 10   < > 10 9 9 10 12 10 13 13   CREATININE 0.76   < > 0.70 0.78 0.66 0.82 1.10 0.74 0.88 0.80  CALCIUM 8.6*   < > 8.5* 8.6* 8.0* 7.9* 8.3* 8.5* 8.5* 8.8*  MG 2.0  --   --   --   --   --   --   --   --   --   PHOS  --   --  3.3 2.9 2.7 2.5 2.8  --   --   --    < > =  values in this interval not displayed.   Liver Function Tests: Recent Labs  Lab 08/31/21 1303 08/31/21 1946 08/31/21 2359 09/01/21 0633 09/01/21 1215 09/01/21 1620  AST 42*  --   --   --   --   --   ALT 42  --   --   --   --   --   ALKPHOS 66  --   --   --   --   --   BILITOT 0.9  --   --   --   --   --   PROT 7.2  --   --   --   --   --   ALBUMIN 3.8 3.6 3.6 3.2* 3.1* 3.0*   No results for input(s): LIPASE, AMYLASE in the last 168 hours. Recent Labs  Lab 08/31/21 1303  AMMONIA 17   CBC: Recent Labs  Lab 08/31/21 1303 08/31/21 2359  WBC 11.4* 11.4*  NEUTROABS 8.9*  --   HGB 12.6* 13.1  HCT 34.0* 36.7*  MCV 91.6 93.9  PLT 387 404*   Cardiac Enzymes: No results for input(s): CKTOTAL, CKMB, CKMBINDEX, TROPONINI in the last 168 hours. BNP: Invalid input(s): POCBNP CBG: Recent Labs  Lab 08/31/21 1449 09/02/21 1953  GLUCAP 78 106*   D-Dimer No results for input(s): DDIMER in the last 72 hours. Hgb A1c No results for input(s): HGBA1C in the last 72 hours. Lipid Profile No results for input(s): CHOL, HDL, LDLCALC, TRIG, CHOLHDL, LDLDIRECT in the  last 72 hours. Thyroid function studies Recent Labs    08/31/21 1412  TSH 0.774   Anemia work up Recent Labs    08/31/21 2359  TIBC 354  IRON 67   Urinalysis    Component Value Date/Time   COLORURINE STRAW (A) 08/31/2021 1303   APPEARANCEUR CLEAR 08/31/2021 1303   LABSPEC 1.004 (L) 08/31/2021 1303   PHURINE 6.0 08/31/2021 1303   GLUCOSEU 50 (A) 08/31/2021 1303   GLUCOSEU NEGATIVE 05/24/2016 1036   HGBUR SMALL (A) 08/31/2021 1303   BILIRUBINUR NEGATIVE 08/31/2021 1303   KETONESUR 5 (A) 08/31/2021 1303   PROTEINUR NEGATIVE 08/31/2021 1303   UROBILINOGEN 0.2 05/24/2016 1036   NITRITE NEGATIVE 08/31/2021 1303   LEUKOCYTESUR NEGATIVE 08/31/2021 1303   Sepsis Labs Invalid input(s): PROCALCITONIN,  WBC,  LACTICIDVEN Microbiology Recent Results (from the past 240 hour(s))  Resp Panel by RT-PCR (Flu A&B, Covid) Nasopharyngeal Swab     Status: None   Collection Time: 08/31/21  2:22 PM   Specimen: Nasopharyngeal Swab; Nasopharyngeal(NP) swabs in vial transport medium  Result Value Ref Range Status   SARS Coronavirus 2 by RT PCR NEGATIVE NEGATIVE Final    Comment: (NOTE) SARS-CoV-2 target nucleic acids are NOT DETECTED.  The SARS-CoV-2 RNA is generally detectable in upper respiratory specimens during the acute phase of infection. The lowest concentration of SARS-CoV-2 viral copies this assay can detect is 138 copies/mL. A negative result does not preclude SARS-Cov-2 infection and should not be used as the sole basis for treatment or other patient management decisions. A negative result may occur with  improper specimen collection/handling, submission of specimen other than nasopharyngeal swab, presence of viral mutation(s) within the areas targeted by this assay, and inadequate number of viral copies(<138 copies/mL). A negative result must be combined with clinical observations, patient history, and epidemiological information. The expected result is Negative.  Fact Sheet  for Patients:  EntrepreneurPulse.com.au  Fact Sheet for Healthcare Providers:  IncredibleEmployment.be  This test is no t yet approved  or cleared by the [place] and  has been authorized for detection and/or diagnosis of SARS-CoV-2 by FDA under an Emergency Use Authorization (EUA). This EUA will remain  in effect (meaning this test can be used) for the duration of the COVID-19 declaration under Section 564(b)(1) of the Act, 21 U.S.C.section 360bbb-3(b)(1), unless the authorization is terminated  or revoked sooner.       Influenza A by PCR NEGATIVE NEGATIVE Final   Influenza B by PCR NEGATIVE NEGATIVE Final    Comment: (NOTE) The Xpert Xpress SARS-CoV-2/FLU/RSV plus assay is intended as an aid in the diagnosis of influenza from Nasopharyngeal swab specimens and should not be used as a sole basis for treatment. Nasal washings and aspirates are unacceptable for Xpert Xpress SARS-CoV-2/FLU/RSV testing.  Fact Sheet for Patients: EntrepreneurPulse.com.au  Fact Sheet for Healthcare Providers: IncredibleEmployment.be  This test is not yet approved or cleared by the [place] FDA and has been authorized for detection and/or diagnosis of SARS-CoV-2 by FDA under an Emergency Use Authorization (EUA). This EUA will remain in effect (meaning this test can be used) for the duration of the COVID-19 declaration under Section 564(b)(1) of the Act, 21 U.S.C. section 360bbb-3(b)(1), unless the authorization is terminated or revoked.  Performed at [facility], [place] [address], [place], [place] [address]   MRSA Next Gen by PCR, Nasal     Status: None   Collection Time: 08/31/21  9:20 PM   Specimen: Nasal Mucosa; Nasal Swab  Result Value Ref Range Status   MRSA by PCR Next Gen NOT DETECTED NOT DETECTED Final    Comment: (NOTE) The GeneXpert MRSA Assay (FDA approved for NASAL specimens  only), is one component of a comprehensive MRSA colonization surveillance program. It is not intended to diagnose MRSA infection nor to guide or monitor treatment for MRSA infections. Test performance is not FDA approved in patients less than 30 years old. Performed at [facility], [place] [address], [place],  [address]      Time coordinating discharge in minutes: 28  SIGNED:   Debbe Odea, MD  [facility] 09/03/2021, 2:06 PM

## 2021-09-04 LAB — VITAMIN B1: Vitamin B1 (Thiamine): 150.5 nmol/L (ref 66.5–200.0)

## 2021-09-05 ENCOUNTER — Telehealth: Payer: Self-pay

## 2021-09-05 NOTE — Telephone Encounter (Signed)
Transition Care Management Unsuccessful Follow-up Telephone Call  Date of discharge and from where:  Oscar Phillips 09/02/2021  Attempts:  1st Attempt  Reason for unsuccessful TCM follow-up call:  Unable to reach patient

## 2021-09-06 NOTE — Telephone Encounter (Signed)
Transition Care Management Unsuccessful Follow-up Telephone Call  Date of discharge and from where:  Oscar Phillips 09/02/2021  Attempts:  2nd Attempt  Reason for unsuccessful TCM follow-up call:  Unable to reach patient

## 2021-09-10 ENCOUNTER — Other Ambulatory Visit: Payer: Self-pay | Admitting: Internal Medicine

## 2021-09-10 DIAGNOSIS — I10 Essential (primary) hypertension: Secondary | ICD-10-CM

## 2021-09-10 DIAGNOSIS — R000 Tachycardia, unspecified: Secondary | ICD-10-CM

## 2021-09-11 ENCOUNTER — Other Ambulatory Visit: Payer: Self-pay | Admitting: Internal Medicine

## 2021-09-11 ENCOUNTER — Encounter (HOSPITAL_BASED_OUTPATIENT_CLINIC_OR_DEPARTMENT_OTHER): Payer: Medicare Other | Admitting: Cardiovascular Disease

## 2021-09-11 DIAGNOSIS — I10 Essential (primary) hypertension: Secondary | ICD-10-CM

## 2021-09-14 ENCOUNTER — Ambulatory Visit: Payer: Medicare Other | Admitting: Internal Medicine

## 2021-09-16 ENCOUNTER — Other Ambulatory Visit: Payer: Self-pay

## 2021-09-16 ENCOUNTER — Encounter: Payer: Self-pay | Admitting: Internal Medicine

## 2021-09-16 ENCOUNTER — Ambulatory Visit (INDEPENDENT_AMBULATORY_CARE_PROVIDER_SITE_OTHER): Payer: Medicare Other | Admitting: Internal Medicine

## 2021-09-16 VITALS — BP 136/70 | HR 90 | Temp 98.2°F | Resp 16 | Ht 69.0 in | Wt 162.8 lb

## 2021-09-16 DIAGNOSIS — R972 Elevated prostate specific antigen [PSA]: Secondary | ICD-10-CM | POA: Diagnosis not present

## 2021-09-16 DIAGNOSIS — R918 Other nonspecific abnormal finding of lung field: Secondary | ICD-10-CM

## 2021-09-16 DIAGNOSIS — K7010 Alcoholic hepatitis without ascites: Secondary | ICD-10-CM | POA: Diagnosis not present

## 2021-09-16 DIAGNOSIS — Z23 Encounter for immunization: Secondary | ICD-10-CM

## 2021-09-16 DIAGNOSIS — E538 Deficiency of other specified B group vitamins: Secondary | ICD-10-CM | POA: Diagnosis not present

## 2021-09-16 DIAGNOSIS — E871 Hypo-osmolality and hyponatremia: Secondary | ICD-10-CM | POA: Diagnosis not present

## 2021-09-16 DIAGNOSIS — D539 Nutritional anemia, unspecified: Secondary | ICD-10-CM

## 2021-09-16 LAB — CBC WITH DIFFERENTIAL/PLATELET
Basophils Absolute: 0 10*3/uL (ref 0.0–0.1)
Basophils Relative: 0.6 % (ref 0.0–3.0)
Eosinophils Absolute: 0 10*3/uL (ref 0.0–0.7)
Eosinophils Relative: 0.5 % (ref 0.0–5.0)
HCT: 39.6 % (ref 39.0–52.0)
Hemoglobin: 13.7 g/dL (ref 13.0–17.0)
Lymphocytes Relative: 19.2 % (ref 12.0–46.0)
Lymphs Abs: 1.4 10*3/uL (ref 0.7–4.0)
MCHC: 34.6 g/dL (ref 30.0–36.0)
MCV: 96.4 fl (ref 78.0–100.0)
Monocytes Absolute: 0.5 10*3/uL (ref 0.1–1.0)
Monocytes Relative: 6.9 % (ref 3.0–12.0)
Neutro Abs: 5.2 10*3/uL (ref 1.4–7.7)
Neutrophils Relative %: 72.8 % (ref 43.0–77.0)
Platelets: 453 10*3/uL — ABNORMAL HIGH (ref 150.0–400.0)
RBC: 4.11 Mil/uL — ABNORMAL LOW (ref 4.22–5.81)
RDW: 13.5 % (ref 11.5–15.5)
WBC: 7.2 10*3/uL (ref 4.0–10.5)

## 2021-09-16 LAB — BASIC METABOLIC PANEL
BUN: 8 mg/dL (ref 6–23)
CO2: 24 mEq/L (ref 19–32)
Calcium: 10 mg/dL (ref 8.4–10.5)
Chloride: 98 mEq/L (ref 96–112)
Creatinine, Ser: 0.9 mg/dL (ref 0.40–1.50)
GFR: 89.26 mL/min (ref >60.00)
Glucose, Bld: 90 mg/dL (ref 70–99)
Potassium: 5 mEq/L (ref 3.5–5.1)
Sodium: 133 mEq/L — ABNORMAL LOW (ref 135–145)

## 2021-09-16 LAB — PROTIME-INR
INR: 1.1 ratio — ABNORMAL HIGH (ref 0.8–1.0)
Prothrombin Time: 12 s (ref 9.6–13.1)

## 2021-09-16 LAB — HEPATIC FUNCTION PANEL
ALT: 26 U/L (ref 0–53)
AST: 22 U/L (ref 0–37)
Albumin: 4.4 g/dL (ref 3.5–5.2)
Alkaline Phosphatase: 63 U/L (ref 39–117)
Bilirubin, Direct: 0.1 mg/dL (ref 0.0–0.3)
Total Bilirubin: 0.5 mg/dL (ref 0.2–1.2)
Total Protein: 7.3 g/dL (ref 6.0–8.3)

## 2021-09-16 LAB — PSA: PSA: 10.08 ng/mL — ABNORMAL HIGH (ref 0.10–4.00)

## 2021-09-16 MED ORDER — CYANOCOBALAMIN 1000 MCG/ML IJ SOLN
1000.0000 ug | Freq: Once | INTRAMUSCULAR | Status: AC
  Administered 2021-09-16: 1000 ug via INTRAMUSCULAR

## 2021-09-16 NOTE — Patient Instructions (Signed)
Anemia Anemia is a condition in which there is not enough red blood cells or hemoglobin in the blood. Hemoglobin is a substance in red blood cells that carries oxygen. When you do not have enough red blood cells or hemoglobin (are anemic), your body cannot get enough oxygen and your organs may not work properly. As a result, you may feel very tired or have other problems. What are the causes? Common causes of anemia include: Excessive bleeding. Anemia can be caused by excessive bleeding inside or outside the body, including bleeding from the intestines or from heavy menstrual periods in females. Poor nutrition. Long-lasting (chronic) kidney, thyroid, and liver disease. Bone marrow disorders, spleen problems, and blood disorders. Cancer and treatments for cancer. HIV (human immunodeficiency virus) and AIDS (acquired immunodeficiency syndrome). Infections, medicines, and autoimmune disorders that destroy red blood cells. What are the signs or symptoms? Symptoms of this condition include: Minor weakness. Dizziness. Headache, or difficulties concentrating and sleeping. Heartbeats that feel irregular or faster than normal (palpitations). Shortness of breath, especially with exercise. Pale skin, lips, and nails, or cold hands and feet. Indigestion and nausea. Symptoms may occur suddenly or develop slowly. If your anemia is mild, you may not have symptoms. How is this diagnosed? This condition is diagnosed based on blood tests, your medical history, and a physical exam. In some cases, a test may be needed in which cells are removed from the soft tissue inside of a bone and looked at under a microscope (bone marrow biopsy). Your health care provider may also check your stool (feces) for blood and may do additional testing to look for the cause of your bleeding. Other tests may include: Imaging tests, such as a CT scan or MRI. A procedure to see inside your esophagus and stomach (endoscopy). A  procedure to see inside your colon and rectum (colonoscopy). How is this treated? Treatment for this condition depends on the cause. If you continue to lose a lot of blood, you may need to be treated at a hospital. Treatment may include: Taking supplements of iron, vitamin B12, or folic acid. Taking a hormone medicine (erythropoietin) that can help to stimulate red blood cell growth. Having a blood transfusion. This may be needed if you lose a lot of blood. Making changes to your diet. Having surgery to remove your spleen. Follow these instructions at home: Take over-the-counter and prescription medicines only as told by your health care provider. Take supplements only as told by your health care provider. Follow any diet instructions that you were given by your health care provider. Keep all follow-up visits as told by your health care provider. This is important. Contact a health care provider if: You develop new bleeding anywhere in the body. Get help right away if: You are very weak. You are short of breath. You have pain in your abdomen or chest. You are dizzy or feel faint. You have trouble concentrating. You have bloody stools, black stools, or tarry stools. You vomit repeatedly or you vomit up blood. These symptoms may represent a serious problem that is an emergency. Do not wait to see if the symptoms will go away. Get medical help right away. Call your local emergency services (911 in the [place] Do not drive yourself to the hospital. Summary Anemia is a condition in which you do not have enough red blood cells or enough of a substance in your red blood cells that carries oxygen (hemoglobin). Symptoms may occur suddenly or develop slowly. If your anemia is   mild, you may not have symptoms. This condition is diagnosed with blood tests, a medical history, and a physical exam. Other tests may be needed. Treatment for this condition depends on the cause of the anemia. This  information is not intended to replace advice given to you by your health care provider. Make sure you discuss any questions you have with your health care provider. Document Revised: 09/02/2019 Document Reviewed: 09/02/2019 Elsevier Patient Education  2022 [employer]  

## 2021-09-16 NOTE — Progress Notes (Signed)
Subjective:  Patient ID: Oscar Phillips, male    DOB: April 09, 1956  Age: 65 y.o. MRN: [id number]  CC: Hospitalization Follow-up and Anemia  This visit occurred during the SARS-CoV-2 public health emergency.  Safety protocols were in place, including screening questions prior to the visit, additional usage of staff PPE, and extensive cleaning of exam room while observing appropriate contact time as indicated for disinfecting solutions.    HPI Oscar Phillips presents for f/up - He was recently admitted for symptomatic hyponatremia.  It sounds like he drinks enough beer to cause beer potomania.  Since discharge he has decreased his alcohol intake but he tells me he is not willing to stop drinking alcohol.  He says he has to have the buzz to get through the day.  He did rehab several years ago and is not willing to go back.  He also says he is not interested in attending AA.  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 8.5 g 2   Cholecalciferol (VITAMIN D3) 250 MCG (10000 UT) capsule Take 10,000 Units by mouth daily.     EDARBI 80 MG TABS TAKE 1  TABLET BY MOUTH ONCE DAILY (Patient taking differently: Take 80 mg by mouth daily.) 90 tablet 0   folic acid (FOLVITE) 1 MG tablet Take 1 tablet (1 mg total) by mouth daily. 30 tablet 0   imipramine (TOFRANIL) 25 MG tablet Take 25 mg by mouth daily.     Multiple Vitamin (MULTIVITAMIN WITH MINERALS) TABS tablet Take 1 tablet by mouth daily. Centrum     nebivolol (BYSTOLIC) 5 MG tablet TAKE 1 TABLET(5 MG) BY MOUTH DAILY 90 tablet 0   thiamine (VITAMIN B-1) 100 MG tablet Take 1 Tablet BY MOUTH TWICE DAILY for 14 days (Patient taking differently: Take 100 mg by mouth daily.) 90 tablet 1   thiamine 100 MG tablet Take 1 tablet (100 mg total) by mouth daily. 30 tablet 0   TRELEGY ELLIPTA 100-62.5-25 MCG/ACT AEPB INHALE 1 PUFF INTO THE LUNGS DAILY (Patient taking differently: Inhale 1 puff into the lungs daily.) 120 each 1   nicotine (NICODERM CQ - DOSED IN MG/24 HOURS) 14 mg/24hr patch Place 1 patch (14 mg total) onto the skin daily. (Patient not taking: Reported on 09/16/2021) 14 patch 0   nicotine (NICODERM CQ) 7 mg/24hr patch Place 1 patch (7 mg total) onto the skin daily. Start after 14 mg patches have finished (Patient not taking: Reported on 09/16/2021) 14 patch 0   No facility-administered medications prior to visit.    ROS Review of Systems  Constitutional:  Negative for chills, diaphoresis and fatigue.  HENT: Negative.  Respiratory:  Negative for cough, chest tightness, shortness of breath and wheezing.   Cardiovascular:  Negative for chest pain, palpitations and leg swelling.  Gastrointestinal:  Negative for abdominal pain, constipation, diarrhea and vomiting.  Endocrine: Negative.  Negative for polyuria.  Genitourinary: Negative.  Negative for difficulty urinating.  Musculoskeletal:  Negative for arthralgias.  Skin: Negative.  Negative for color change.  Hematological:  Negative for adenopathy. Does not bruise/bleed easily.  Psychiatric/Behavioral:  Positive for confusion, decreased  concentration and sleep disturbance. Negative for dysphoric mood and suicidal ideas. The patient is not nervous/anxious.    Objective:  BP 136/70 (BP Location: Right Arm, Patient Position: Sitting, Cuff Size: Large)   Pulse (!) 106   Temp 98.2 F (36.8 C) (Oral)   Ht 5\' 9"  (1.753 m)   Wt 162 lb 12.8 oz (73.8 kg)   SpO2 96%   BMI 24.04 kg/m   BP Readings from Last 3 Encounters:  09/16/21 136/70  09/03/21 (!) 143/52  07/18/21 (!) 150/80    Wt Readings from Last 3 Encounters:  09/16/21 162 lb 12.8 oz (73.8 kg)  08/31/21 163 lb 2.3 oz (74 kg)  07/18/21 182 lb 9.6 oz (82.8 kg)    Physical Exam Vitals reviewed.  Constitutional:      Appearance: He is not ill-appearing.  HENT:     Nose: Nose normal.     Mouth/Throat:     Mouth: Mucous membranes are moist.  Eyes:     General: No scleral icterus.    Conjunctiva/sclera: Conjunctivae normal.  Cardiovascular:     Rate and Rhythm: Normal rate and regular rhythm.     Pulses: Normal pulses.     Heart sounds: No murmur heard. Pulmonary:     Effort: Pulmonary effort is normal.     Breath sounds: No stridor. No wheezing, rhonchi or rales.  Abdominal:     General: Abdomen is flat.     Palpations: There is no mass.     Tenderness: There is no abdominal tenderness. There is no guarding.  Musculoskeletal:        General: Normal range of motion.     Cervical back: Neck supple.     Right lower leg: No edema.     Left lower leg: No edema.  Lymphadenopathy:     Cervical: No cervical adenopathy.  Skin:    General: Skin is warm and dry.     Coloration: Skin is not pale.  Neurological:     General: No focal deficit present.     Mental Status: He is alert. Mental status is at baseline.  Psychiatric:        Attention and Perception: He is inattentive.        Mood and Affect: Mood is not anxious or depressed. Affect is flat. Affect is not labile.        Speech: Speech is delayed and tangential. Speech is not rapid and pressured or  slurred.        Behavior: Behavior is slowed and withdrawn. Behavior is not agitated, aggressive, hyperactive or combative.        Thought Content: Thought content normal.        Cognition and Memory: Cognition is not impaired.        Judgment: Judgment normal.    Lab Results  Component Value Date   WBC 7.2 09/16/2021   HGB 13.7 09/16/2021   HCT 39.6 09/16/2021   PLT 453.0 (H) 09/16/2021   GLUCOSE 90 09/16/2021   CHOL 190  04/04/2021   TRIG 160.0 (H) 04/04/2021   HDL 66.40 04/04/2021   LDLDIRECT 154.1 06/03/2010   LDLCALC 91 04/04/2021   ALT 26 09/16/2021   AST 22 09/16/2021   NA 133 (L) 09/16/2021   K 5.0 09/16/2021   CL 98 09/16/2021   CREATININE 0.90 09/16/2021   BUN 8 09/16/2021   CO2 24 09/16/2021   TSH 0.774 08/31/2021   PSA 10.08 (H) 09/16/2021   INR 1.1 (H) 09/16/2021   HGBA1C 5.5 03/02/2018    CT HEAD WO CONTRAST  Result Date: 08/31/2021 CLINICAL DATA:  Altered mental status, falls EXAM: CT HEAD WITHOUT CONTRAST TECHNIQUE: Contiguous axial images were obtained from the base of the skull through the vertex without intravenous contrast. COMPARISON:  None. FINDINGS: Brain: Periventricular white matter and corona radiata hypodensities favor chronic ischemic microvascular white matter disease. Frontal lobe confluence of white matter hypodensity noted left greater than right on image 20 of series 2, chronic remote white matter infarct not excluded. Otherwise, the brainstem, cerebellum, cerebral peduncles, thalamus, basal ganglia, basilar cisterns, and ventricular system appear within normal limits. No intracranial hemorrhage, mass lesion, or acute CVA. Vascular: There is atherosclerotic calcification of the cavernous carotid arteries bilaterally. Vertebral artery atherosclerotic calcifications noted. Skull: Unremarkable Sinuses/Orbits: Unremarkable Other: No supplemental non-categorized findings. IMPRESSION: 1. No acute intracranial findings. 2. Periventricular white matter and  corona radiata hypodensities favor chronic ischemic microvascular white matter disease. Confluent frontal lobe white matter hypodensity on the left could possibly reflect a remote white matter infarct. 3. Atherosclerosis. Electronically Signed   By: Van Clines M.D.   On: 08/31/2021 14:41    Assessment & Plan:   Sundeep was seen today for hospitalization follow-up and anemia.  Diagnoses and all orders for this visit:  Alcoholic hepatitis without ascites- His MELD score is reassuring at 7. -     Protime-INR; Future -     Hepatic function panel; Future -     Hepatic function panel -     Protime-INR  B12 deficiency- I recommended that he get a monthly B12 injection. -     Cancel: CBC with Differential/Platelet; Future -     CBC with Differential/Platelet; Future -     CBC with Differential/Platelet -     cyanocobalamin ((VITAMIN B-12)) injection 1,000 mcg  Hyponatremia- Improvement noted.  I recommended that he continue to decrease his alcohol intake and to consider admitting himself into rehab or attending [place] meetings. -     Basic metabolic panel; Future -     Basic metabolic panel; Future -     Basic metabolic panel -     Basic metabolic panel  PSA elevation -     PSA; Future -     PSA -     Ambulatory referral to Urology  Deficiency anemia- His H&H are normal now. -     CBC with Differential/Platelet; Future -     Cancel: IBC + Ferritin; Future -     Cancel: Vitamin B1; Future -     Cancel: Folate; Future -     Cancel: Vitamin B1 -     CBC with Differential/Platelet  Need for influenza vaccination -     Flu Vaccine QUAD High Dose(Fluad)  Need for pneumococcal vaccination -     Pneumococcal conjugate vaccine 20-valent (Prevnar 20)  Multiple lung nodules on CT- He is high risk due to tobacco abuse.  He is due for follow-up. -     CT Chest Wo Contrast; Future  I am having Justin Meisenheimer maintain his albuterol, thiamine, Edarbi, Trelegy Ellipta, imipramine,  Vitamin D3, multivitamin with minerals, folic acid, nicotine, nicotine, thiamine, and nebivolol. We administered cyanocobalamin.  Meds ordered this encounter  Medications   cyanocobalamin ((VITAMIN B-12)) injection 1,000 mcg      Follow-up: Return in about 3 months (around 12/15/2021).  Scarlette Calico, MD

## 2021-09-20 ENCOUNTER — Telehealth: Payer: Self-pay

## 2021-09-20 NOTE — Telephone Encounter (Signed)
Letter has been sent to patient instructing them to call us if they are still interested in completing their sleep study. If we have not received a response from the patient within 30 days of this notice, the order will be cancelled and they will need to discuss the need for a sleep study at their next office visit.  ° °

## 2021-09-21 LAB — VITAMIN B1: Vitamin B1 (Thiamine): 17 nmol/L (ref 8–30)

## 2021-09-24 ENCOUNTER — Encounter: Payer: Self-pay | Admitting: Internal Medicine

## 2021-10-17 NOTE — Progress Notes (Deleted)
Cardiology Office Note   Date:  10/17/2021   ID:  Oscar Phillips, DOB 1956-07-10, MRN [id number]  PCP:  Janith Lima, MD  Cardiologist:   Minus Breeding, MD Referring:  ***  No chief complaint on file.     History of Present Illness: Oscar Phillips is a 66 y.o. male who presents for evaluation of systolic HF.  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   His cardiac history dates back to 06/2018 when he presented to the hospital with anorexia symptoms and was found to have elevated HsTrops and cardiogenic shock. Echocardiogram in 2019 showed EF 35-40%, mild LVH, akinesis of the mid-apicalanteroseptal, anterior, anterolateral, lateral, inferolateral, and apical myocardium, and no significant valvular abnormalities. He then underwent a LHC which showed 50% LAD stenosis, 70% D1 stenosis, 50-60% mLCx stenosis, 20% RCA stenosis, and apical ballooning of the LV c/w Takotsubo cardiomyopathy with EF estimated to be 25% at that time. ****     , HTN, HLD, COPD, GERD, OCD, tobacco abuse and history of ETOH abuse, who presents for evaluation of fatigue and follow-up of his CHF.   He was last evaluated by cardiology at an outpatient visit with Rosaria Ferries 10/2018 at which time he had several complaints including upper arm pain, neuropathy in his feet, poor appetite, minor non-exertional chest pain, and muscle twitching with higher doses of Remeron. He had some weight gain which he attributed to snacking but no evidence of volume overload on exam. It is noted that he refused medication changes following CHF diagnosis, therefore was not started on an ARB/ARNI (did have cough with lisinopril) He was recommended to repeat an echocardiogram at that time however was lost to follow-up.     He contacted the office 07/14/21 asking about his history of CHF with EF 25% and whether this is contributing to his fatigue and overall feeling poorly. He has continued to follow with his  PCP, Dr. Ronnald Ramp despite being lost to cardiology care. At his last visit 03/2021 his BP was markedly elevated and he had complaints of right-sided chest discomfort at rest. He was recommended for blood work to screen for adrenal hyperplasia given chronic Etoh abuse and he was restarted on Nebivolol and torsemide in addition to azilsartan.   He presents today with his brother. He has had ongoing fatigue and DOE. He reports rare episodes of chest discomfort which last for seconds at a time and are not exertional in nature. He has continued smoking 1-2 ppd and drinking up to a 12 pack of beer a day, sometimes alternating with wine which he may drink 1/2-3/4 of a bottle a day. We discussed cardiovascular/pulmonary risks with ongoing use and importance of cutting back/quitting. He reports abdominal bloating and orthopnea but no LE edema or PND. He denies palpitations, dizziness, lightheadedness, or syncope.      Past Medical History:  Diagnosis Date   Abdominal bruit    Alcoholism ([place])    Anxiety    Atypical mole 11/06/2018   left post shoulder (mild)   B12 deficiency    Basal cell carcinoma 11/06/2018   right forehead bcc nod.   BCC (basal cell carcinoma of skin) 11/06/2018   left nasal ala    Bronchitis    COPD (chronic obstructive pulmonary disease) (HCC)    GERD (gastroesophageal reflux disease)    H/O trichomonal urethritis    History of panic attacks    History of rectal bleeding    in August 2016  HLD (hyperlipidemia)    HTN (hypertension)    OCD (obsessive compulsive disorder)    PSA elevation     Past Surgical History:  Procedure Laterality Date   ELBOW FRACTURE SURGERY Left    LEFT HEART CATH AND CORONARY ANGIOGRAPHY N/A 07/04/2018   Procedure: LEFT HEART CATH AND CORONARY ANGIOGRAPHY;  Surgeon: Belva Crome, MD;  Location: [facility];  Service: Cardiovascular;  Laterality: N/A;   LUMBAR DISC SURGERY     TONSILLECTOMY  1965     Current Outpatient Medications   Medication Sig Dispense Refill   albuterol (VENTOLIN HFA) 108 (90 Base) MCG/ACT inhaler Inhale 2 puffs into the lungs every 6 (six) hours as needed for wheezing or shortness of breath. 8.5 g 2   Cholecalciferol (VITAMIN D3) 250 MCG (10000 UT) capsule Take 10,000 Units by mouth daily.     EDARBI 80 MG TABS TAKE 1 TABLET BY MOUTH ONCE DAILY (Patient taking differently: Take 80 mg by mouth daily.) 90 tablet 0   imipramine (TOFRANIL) 25 MG tablet Take 25 mg by mouth daily.     Multiple Vitamin (MULTIVITAMIN WITH MINERALS) TABS tablet Take 1 tablet by mouth daily. Centrum     nebivolol (BYSTOLIC) 5 MG tablet TAKE 1 TABLET(5 MG) BY MOUTH DAILY 90 tablet 0   nicotine (NICODERM CQ - DOSED IN MG/24 HOURS) 14 mg/24hr patch Place 1 patch (14 mg total) onto the skin daily. (Patient not taking: Reported on 09/16/2021) 14 patch 0   nicotine (NICODERM CQ) 7 mg/24hr patch Place 1 patch (7 mg total) onto the skin daily. Start after 14 mg patches have finished (Patient not taking: Reported on 09/16/2021) 14 patch 0   thiamine (VITAMIN B-1) 100 MG tablet Take 1 Tablet BY MOUTH TWICE DAILY for 14 days (Patient taking differently: Take 100 mg by mouth daily.) 90 tablet 1   TRELEGY ELLIPTA 100-62.5-25 MCG/ACT AEPB INHALE 1 PUFF INTO THE LUNGS DAILY (Patient taking differently: Inhale 1 puff into the lungs daily.) 120 each 1   No current facility-administered medications for this visit.    Allergies:   Lisinopril and Penicillins    ROS:  Please see the history of present illness.   Otherwise, review of systems are positive for {NONE DEFAULTED:18576}.   All other systems are reviewed and negative.    PHYSICAL EXAM: VS:  There were no vitals taken for this visit. , BMI There is no height or weight on file to calculate BMI. GENERAL:  Well appearing NECK:  No jugular venous distention, waveform within normal limits, carotid upstroke brisk and symmetric, no bruits, no thyromegaly LUNGS:  Clear to auscultation  bilaterally CHEST:  Unremarkable HEART:  PMI not displaced or sustained,S1 and S2 within normal limits, no S3, no S4, no clicks, no rubs, *** murmurs ABD:  Flat, positive bowel sounds normal in frequency in pitch, no bruits, no rebound, no guarding, no midline pulsatile mass, no hepatomegaly, no splenomegaly EXT:  2 plus pulses throughout, no edema, no cyanosis no clubbing     ***GENERAL:  Well appearing HEENT:  Pupils equal round and reactive, fundi not visualized, oral mucosa unremarkable NECK:  No jugular venous distention, waveform within normal limits, carotid upstroke brisk and symmetric, no bruits, no thyromegaly LYMPHATICS:  No cervical, inguinal adenopathy LUNGS:  Clear to auscultation bilaterally BACK:  No CVA tenderness CHEST:  Unremarkable HEART:  PMI not displaced or sustained,S1 and S2 within normal limits, no S3, no S4, no clicks, no rubs, *** murmurs ABD:  Flat, positive bowel sounds normal in frequency in pitch, no bruits, no rebound, no guarding, no midline pulsatile mass, no hepatomegaly, no splenomegaly EXT:  2 plus pulses throughout, no edema, no cyanosis no clubbing SKIN:  No rashes no nodules NEURO:  Cranial nerves II through XII grossly intact, motor grossly intact throughout PSYCH:  Cognitively intact, oriented to person place and time    EKG:  EKG {ACTION; IS/IS AOZ:[id number]} ordered today. The ekg ordered today demonstrates ***   Recent Labs: 08/31/2021: Magnesium 2.0; TSH 0.774 09/16/2021: ALT 26; BUN 8; Creatinine, Ser 0.90; Hemoglobin 13.7; Platelets 453.0; Potassium 5.0; Sodium 133    Lipid Panel    Component Value Date/Time   CHOL 190 04/04/2021 1209   TRIG 160.0 (H) 04/04/2021 1209   HDL 66.40 04/04/2021 1209   CHOLHDL 3 04/04/2021 1209   VLDL 32.0 04/04/2021 1209   LDLCALC 91 04/04/2021 1209   LDLCALC 65 04/15/2020 1604   LDLDIRECT 154.1 06/03/2010 1421      Wt Readings from Last 3 Encounters:  09/16/21 162 lb 12.8 oz (73.8 kg)   08/31/21 163 lb 2.3 oz (74 kg)  07/18/21 182 lb 9.6 oz (82.8 kg)      Other studies Reviewed: Additional studies/ records that were reviewed today include: ***. Review of the above records demonstrates:  Please see elsewhere in the note.  ***   ASSESSMENT AND PLAN:  Chronic combined CHF/NICM:  ***  diagnosed in 2019 with EF 35-40% on echo and noted to be 25% on LHC with LV gram the following day. He was lost to cardiology follow-up since 10/2018. He has been on intermittent GDMT, most recently on nebivolol, azilsartan, and torsemide  - Will update an echocardiogram to re-evaluate LV function - Continue nebivolol and edarbi for now - if EF remains low on repeat echo, would consider transition from edarbi to entresto - Will add spironolactone today - Continue torsemide - Encourage daily weights and low salt diet   Mild-moderate non-obstructive CAD:  ***  noted on cath at the time of CHF diagnosis in 2019 with highest degree of stenosis being 70% D1 lesion, otherwise 50% LAD, 50-60% mLCx, and 20% RCA stenosis.  - Will start aspirin 81mg  daily - Will follow-up echocardiogram results - if EF remains low will consider repeat ischemic testing  - Continue aggressive risk factor modifications including tobacco cessation and maintaining goal BP <130/80, LDL <70, and A1C <7   HTN:  ***  BP 150/80 today - Continue nebivolol and azilsartan (edarbi) Will start spironolactone 25mg  daily for improved BP control in patient with history of EF <40%. Will check BMET in 2 weeks for close monitoring of elecrtolyes/kidney function   HLD: LDL was *** 91 on lipids 03/2021 though elevated LFTs limits titration of crestor - Continue crestor   COPD: *** on trelegy. Still smoking. Unclear how much this is contributing to his DOE.  - Continue management per PCP  - May benefit from pulm referral    Tobacco abuse: *** smoking 1-2 ppd. We discussed health risks with ongoing use. Cessation advised - Continue to  encourage cessation   ETOH abuse:   *** still drinking anywhere from 1/2-3/4 of a bottler of wine or 12 beers a day. We discussed health risks of ongoing use  - Cessation encouraged.    Suspected OSA:  *** patient with snoring, unrestful sleep, and large neck girth. Epworth score is 10 - Will check a sleep study to r/o OSA     Current medicines  are reviewed at length with the patient today.  The patient {ACTIONS; HAS/DOES NOT HAVE:19233} concerns regarding medicines.  The following changes have been made:  {PLAN; NO CHANGE:13088:s}  Labs/ tests ordered today include: *** No orders of the defined types were placed in this encounter.    Disposition:   FU with ***    Signed, Minus Breeding, MD  10/17/2021 9:07 PM    [facility]

## 2021-10-19 ENCOUNTER — Ambulatory Visit: Payer: Medicare Other | Admitting: Cardiology

## 2021-10-19 DIAGNOSIS — I5022 Chronic systolic (congestive) heart failure: Secondary | ICD-10-CM

## 2021-10-29 ENCOUNTER — Other Ambulatory Visit: Payer: Self-pay | Admitting: Internal Medicine

## 2021-10-29 DIAGNOSIS — I10 Essential (primary) hypertension: Secondary | ICD-10-CM

## 2021-10-29 DIAGNOSIS — I2510 Atherosclerotic heart disease of native coronary artery without angina pectoris: Secondary | ICD-10-CM

## 2021-12-20 ENCOUNTER — Ambulatory Visit: Payer: Medicare Other | Admitting: Physician Assistant

## 2021-12-27 ENCOUNTER — Ambulatory Visit: Payer: Medicare Other | Admitting: Physician Assistant

## 2022-01-07 ENCOUNTER — Encounter (HOSPITAL_COMMUNITY): Payer: Self-pay | Admitting: Emergency Medicine

## 2022-01-07 ENCOUNTER — Emergency Department (HOSPITAL_COMMUNITY)
Admission: EM | Admit: 2022-01-07 | Discharge: 2022-01-07 | Disposition: A | Discharge status: Home / Self Care | Payer: Medicare Other | Attending: Emergency Medicine | Admitting: Emergency Medicine

## 2022-01-07 ENCOUNTER — Emergency Department (HOSPITAL_COMMUNITY): Payer: Medicare Other

## 2022-01-07 ENCOUNTER — Other Ambulatory Visit: Payer: Self-pay

## 2022-01-07 DIAGNOSIS — Y909 Presence of alcohol in blood, level not specified: Secondary | ICD-10-CM | POA: Insufficient documentation

## 2022-01-07 DIAGNOSIS — M79671 Pain in right foot: Secondary | ICD-10-CM | POA: Diagnosis not present

## 2022-01-07 DIAGNOSIS — G478 Other sleep disorders: Secondary | ICD-10-CM | POA: Diagnosis not present

## 2022-01-07 DIAGNOSIS — F1020 Alcohol dependence, uncomplicated: Secondary | ICD-10-CM | POA: Diagnosis not present

## 2022-01-07 DIAGNOSIS — R350 Frequency of micturition: Secondary | ICD-10-CM | POA: Insufficient documentation

## 2022-01-07 DIAGNOSIS — R109 Unspecified abdominal pain: Secondary | ICD-10-CM | POA: Insufficient documentation

## 2022-01-07 DIAGNOSIS — R062 Wheezing: Secondary | ICD-10-CM | POA: Insufficient documentation

## 2022-01-07 DIAGNOSIS — R630 Anorexia: Secondary | ICD-10-CM | POA: Insufficient documentation

## 2022-01-07 DIAGNOSIS — I2510 Atherosclerotic heart disease of native coronary artery without angina pectoris: Secondary | ICD-10-CM | POA: Diagnosis not present

## 2022-01-07 DIAGNOSIS — M79672 Pain in left foot: Secondary | ICD-10-CM | POA: Insufficient documentation

## 2022-01-07 DIAGNOSIS — R197 Diarrhea, unspecified: Secondary | ICD-10-CM | POA: Diagnosis present

## 2022-01-07 DIAGNOSIS — J449 Chronic obstructive pulmonary disease, unspecified: Secondary | ICD-10-CM | POA: Insufficient documentation

## 2022-01-07 DIAGNOSIS — R000 Tachycardia, unspecified: Secondary | ICD-10-CM | POA: Diagnosis not present

## 2022-01-07 LAB — BASIC METABOLIC PANEL
Anion gap: 13 (ref 5–15)
BUN: 21 mg/dL (ref 8–23)
CO2: 24 mmol/L (ref 22–32)
Calcium: 9.5 mg/dL (ref 8.9–10.3)
Chloride: 92 mmol/L — ABNORMAL LOW (ref 98–111)
Creatinine, Ser: 0.99 mg/dL (ref 0.61–1.24)
GFR, Estimated: >60 mL/min (ref >60)
Glucose, Bld: 141 mg/dL — ABNORMAL HIGH (ref 70–99)
Potassium: 3.5 mmol/L (ref 3.5–5.1)
Sodium: 129 mmol/L — ABNORMAL LOW (ref 135–145)

## 2022-01-07 LAB — CBC
HCT: 46.9 % (ref 39.0–52.0)
Hemoglobin: 17 g/dL (ref 13.0–17.0)
MCH: 33.6 pg (ref 26.0–34.0)
MCHC: 36.2 g/dL — ABNORMAL HIGH (ref 30.0–36.0)
MCV: 92.7 fL (ref 80.0–100.0)
Platelets: 272 10*3/uL (ref 150–400)
RBC: 5.06 MIL/uL (ref 4.22–5.81)
RDW: 12.5 % (ref 11.5–15.5)
WBC: 11.6 10*3/uL — ABNORMAL HIGH (ref 4.0–10.5)
nRBC: 0 % (ref 0.0–0.2)

## 2022-01-07 LAB — URINALYSIS, ROUTINE W REFLEX MICROSCOPIC
Bilirubin Urine: NEGATIVE
Glucose, UA: NEGATIVE mg/dL
Hgb urine dipstick: NEGATIVE
Ketones, ur: 5 mg/dL — AB
Leukocytes,Ua: NEGATIVE
Nitrite: NEGATIVE
Protein, ur: NEGATIVE mg/dL
Specific Gravity, Urine: 1.033 — ABNORMAL HIGH (ref 1.005–1.030)
pH: 6 (ref 5.0–8.0)

## 2022-01-07 MED ORDER — CHLORDIAZEPOXIDE HCL 25 MG PO CAPS
50.0000 mg | ORAL_CAPSULE | Freq: Once | ORAL | Status: DC
  Filled 2022-01-07: qty 2

## 2022-01-07 MED ORDER — LORAZEPAM 1 MG PO TABS
2.0000 mg | ORAL_TABLET | Freq: Once | ORAL | Status: DC
  Filled 2022-01-07: qty 2

## 2022-01-07 MED ORDER — LACTATED RINGERS IV BOLUS
2000.0000 mL | Freq: Once | INTRAVENOUS | Status: AC
  Administered 2022-01-07: 2000 mL via INTRAVENOUS

## 2022-01-07 MED ORDER — LORAZEPAM 1 MG PO TABS
2.0000 mg | ORAL_TABLET | Freq: Once | ORAL | Status: AC
  Administered 2022-01-07: 2 mg via ORAL
  Filled 2022-01-07: qty 2

## 2022-01-07 MED ORDER — IPRATROPIUM-ALBUTEROL 0.5-2.5 (3) MG/3ML IN SOLN
3.0000 mL | Freq: Once | RESPIRATORY_TRACT | Status: AC
Start: 2022-01-07 — End: 2022-01-07
  Administered 2022-01-07: 3 mL via RESPIRATORY_TRACT
  Filled 2022-01-07: qty 3

## 2022-01-07 MED ORDER — IOHEXOL 300 MG/ML  SOLN
100.0000 mL | Freq: Once | INTRAMUSCULAR | Status: AC | PRN
  Administered 2022-01-07: 100 mL via INTRAVENOUS

## 2022-01-07 MED ORDER — NICOTINE 21 MG/24HR TD PT24
21.0000 mg | MEDICATED_PATCH | Freq: Once | TRANSDERMAL | Status: DC
  Administered 2022-01-07: 21 mg via TRANSDERMAL
  Filled 2022-01-07: qty 1

## 2022-01-07 NOTE — ED Notes (Signed)
The pt reports that he feels very calm at present ?

## 2022-01-07 NOTE — ED Notes (Signed)
Pt to xray

## 2022-01-07 NOTE — ED Notes (Signed)
The pt suddenly began to loudly of some type bad odor in the room I could not smell anything he told m to remove my mask and I told him after I did I did not smell anything,  he demanded for everything to be removed and he wanted to be moved to the hallway  everything removed except for the iv.  Dr Doren Custard notified of the pts complaints  the pt only had 600 ml of the lr that was ordered  pt yelling to the top of his lungs.   ?

## 2022-01-07 NOTE — ED Notes (Signed)
Pt just returned from xray  waiting for c-t ?

## 2022-01-07 NOTE — ED Notes (Signed)
The pt refused his meds more ativan and librium ?

## 2022-01-07 NOTE — ED Triage Notes (Signed)
Pt states he has not been feeling well for 1 week.  C/o bilateral foot pain, no appetite, increased urination, and diarrhea x 1 week.  Reports unable to sleep for several months.  Heavy etoh use per family. ?

## 2022-01-07 NOTE — ED Notes (Signed)
After speaking with dr Doren Custard  the pt was still not willing to stay here his iv was removed pt getting dressed and calling his brother to come get the pt ?

## 2022-01-07 NOTE — Discharge Instructions (Signed)
The following was seen on your CT scan: " Diffuse bladder wall thickening and trabeculation with more focal thickening along the right side of the dome of bladder".  You should follow-up with a urologist to discuss these findings and possible further testing.  There is a number below to call to set up that urology appointment. ? ?In regards to your alcohol use, you are at risk of withdrawals.  Symptoms of withdrawal can be anxiety, agitation, and confusion.  Alcohol withdrawal can be a deadly condition.  Please seek medical management if you do decide to stop drinking. ? ?In regards to your decreased food intake.  Decreased food intake in the setting of chronic alcohol use can set you up for a recurrence of low sodium.  Low sodium can also be a deadly condition and is characterized often by confusion, seizures, and coma.  Speak with your primary care doctor about appetite stimulants.  He can also speak to them about possible medications that might help with your poor sleep. ? ?If you develop any new or worsening symptoms, please return to the emergency department. ?

## 2022-01-07 NOTE — ED Provider Notes (Signed)
?[facility] ?Provider Note ? ? ?CSN: [id number] ?Arrival date & time: 01/07/22  1431 ? ?  ? ?History ? ?No chief complaint on file. ? ? ?Oscar Phillips is a 66 y.o. male. ? ?HPI ?Patient presents for feeling unwell for the past week.  Symptoms include bilateral foot pain, decreased appetite, increased urination, diarrhea, and poor sleep.  Medical history includes alcohol abuse, COPD, HLD, depression, anxiety, PAD, CAD.  He presents to the emergency department with his brother and sister.  Both of them live out of town and only saw him for the first time today.  Prior to that, they had not seen him since 2 weeks ago.  Today, patient was not able/willing to eat.  He states that he has had GI upset and has experienced nausea and vomiting when he does try to eat.  Additionally, he has had persistent diarrhea.  Patient continues to drink alcohol.  When he was drinking beer, his daily intake was 16/day.  He has since switched to wine because he has found it difficult to carry large volumes of beer.  Patient does seem confused to his siblings.  He was ambulatory today and appeared to have a steady gait.  His last alcoholic drink was this morning. ? ?Home Medications ?Prior to Admission medications   ?Medication Sig Start Date End Date Taking? Authorizing Provider  ?albuterol (VENTOLIN HFA) 108 (90 Base) MCG/ACT inhaler Inhale 2 puffs into the lungs every 6 (six) hours as needed for wheezing or shortness of breath. 04/04/21   Janith Lima, MD  ?Cholecalciferol (VITAMIN D3) 250 MCG (10000 UT) capsule Take 10,000 Units by mouth daily.    [provider]  ?EDARBI 80 MG TABS TAKE 1 TABLET(80 MG) BY MOUTH DAILY 10/29/21   Janith Lima, MD  ?imipramine (TOFRANIL) 25 MG tablet Take 25 mg by mouth daily.    [provider]  ?Multiple Vitamin (MULTIVITAMIN WITH MINERALS) TABS tablet Take 1 tablet by mouth daily. Centrum    [provider]  ?nebivolol (BYSTOLIC) 5  MG tablet TAKE 1 TABLET(5 MG) BY MOUTH DAILY 09/11/21   Janith Lima, MD  ?nicotine (NICODERM CQ - DOSED IN MG/24 HOURS) 14 mg/24hr patch Place 1 patch (14 mg total) onto the skin daily. ?Patient not taking: Reported on 09/16/2021 09/04/21   Debbe Odea, MD  ?nicotine (NICODERM CQ) 7 mg/24hr patch Place 1 patch (7 mg total) onto the skin daily. Start after 14 mg patches have finished ?Patient not taking: Reported on 09/16/2021 09/03/21   Debbe Odea, MD  ?thiamine (VITAMIN B-1) 100 MG tablet Take 1 Tablet BY MOUTH TWICE DAILY for 14 days ?Patient taking differently: Take 100 mg by mouth daily. 04/04/21   Janith Lima, MD  ?Donnal Debar 100-62.5-25 MCG/ACT AEPB INHALE 1 PUFF INTO THE LUNGS DAILY ?Patient taking differently: Inhale 1 puff into the lungs daily. 08/05/21   Janith Lima, MD  ?   ? ?Allergies    ?Lisinopril and Penicillins   ? ?Review of Systems   ?Review of Systems  ?Constitutional:  Positive for appetite change.  ?Gastrointestinal:  Positive for abdominal pain, diarrhea and nausea.  ?Genitourinary:  Positive for frequency.  ?Psychiatric/Behavioral:  Positive for confusion. The patient is nervous/anxious.   ?All other systems reviewed and are negative. ? ?Physical Exam ?Updated Vital Signs ?BP (!) 183/90   Pulse 98   Temp 98.4 ?F (36.9 ?C)   Resp 20   SpO2 96%  ?Physical Exam ?Vitals  and nursing note reviewed.  ?Constitutional:   ?   General: He is not in acute distress. ?   Appearance: Normal appearance. He is well-developed and normal weight. He is not ill-appearing, toxic-appearing or diaphoretic.  ?HENT:  ?   Head: Normocephalic and atraumatic.  ?   Right Ear: External ear normal.  ?   Left Ear: External ear normal.  ?   Nose: Nose normal.  ?   Mouth/Throat:  ?   Mouth: Mucous membranes are moist.  ?   Pharynx: Oropharynx is clear.  ?Eyes:  ?   General: No scleral icterus. ?   Extraocular Movements: Extraocular movements intact.  ?   Conjunctiva/sclera: Conjunctivae normal.   ?Cardiovascular:  ?   Rate and Rhythm: Regular rhythm. Tachycardia present.  ?   Heart sounds: No murmur heard. ?Pulmonary:  ?   Effort: Pulmonary effort is normal. No respiratory distress.  ?   Breath sounds: Wheezing present. No rhonchi or rales.  ?Abdominal:  ?   General: There is no distension.  ?   Palpations: Abdomen is soft.  ?   Tenderness: There is no abdominal tenderness. There is no right CVA tenderness, left CVA tenderness or guarding.  ?Musculoskeletal:     ?   General: No swelling or deformity. Normal range of motion.  ?   Cervical back: Normal range of motion and neck supple.  ?   Right lower leg: No edema.  ?   Left lower leg: No edema.  ?Skin: ?   General: Skin is warm and dry.  ?   Capillary Refill: Capillary refill takes less than 2 seconds.  ?   Coloration: Skin is not jaundiced or pale.  ?Neurological:  ?   General: No focal deficit present.  ?   Mental Status: He is alert and oriented to person, place, and time.  ?   Cranial Nerves: No cranial nerve deficit.  ?   Sensory: No sensory deficit.  ?   Motor: No weakness.  ?   Coordination: Coordination normal.  ?Psychiatric:     ?   Mood and Affect: Mood normal.     ?   Behavior: Behavior normal.     ?   Thought Content: Thought content normal.     ?   Judgment: Judgment normal.  ? ? ?ED Results / Procedures / Treatments   ?Labs ?(all labs ordered are listed, but only abnormal results are displayed) ?Labs Reviewed  ?BASIC METABOLIC PANEL - Abnormal; Notable for the following components:  ?    Result Value  ? Sodium 129 (*)   ? Chloride 92 (*)   ? Glucose, Bld 141 (*)   ? All other components within normal limits  ?CBC - Abnormal; Notable for the following components:  ? WBC 11.6 (*)   ? MCHC 36.2 (*)   ? All other components within normal limits  ?URINALYSIS, ROUTINE W REFLEX MICROSCOPIC - Abnormal; Notable for the following components:  ? Specific Gravity, Urine 1.033 (*)   ? Ketones, ur 5 (*)   ? All other components within normal limits   ?HEPATIC FUNCTION PANEL  ?MAGNESIUM  ?ETHANOL  ?CBG MONITORING, ED  ? ? ?EKG ?EKG Interpretation ? ?Date/Time:  Saturday January 07 2022 15:56:58 EDT ?Ventricular Rate:  106 ?PR Interval:  154 ?QRS Duration: 138 ?QT Interval:  358 ?QTC Calculation: 476 ?R Axis:   -65 ?Text Interpretation: Sinus tachycardia with irregular rate Right atrial enlargement RBBB and LAFB Confirmed by Godfrey Pick [phone])  on 01/07/2022 4:24:24 PM ? ?Radiology ?CT Head Wo Contrast ? ?Result Date: 01/07/2022 ?CLINICAL DATA:  Mental status change. Patient has not been feeling well for 1 week. Unable to sleep for several months. EXAM: CT HEAD WITHOUT CONTRAST TECHNIQUE: Contiguous axial images were obtained from the base of the skull through the vertex without intravenous contrast. RADIATION DOSE REDUCTION: This exam was performed according to the departmental dose-optimization program which includes automated exposure control, adjustment of the mA and/or kV according to patient size and/or use of iterative reconstruction technique. COMPARISON:  CT head 08/31/2021 FINDINGS: Brain: No evidence of acute infarction, hemorrhage, hydrocephalus, extra-axial collection or mass lesion/mass effect. Mild generalized parenchymal volume loss. Scattered hypodensities in the periventricular and subcortical white matter, consistent with chronic small-vessel ischemic changes and unchanged compared to 08/31/2021. Vascular: No hyperdense vessel or unexpected calcification. Skull: Normal. Negative for fracture or focal lesion. Sinuses/Orbits: No acute finding. Paranasal sinuses and mastoid air cells are clear. Other: A 0.5 cm soft tissue density in the subcutaneous tissues of the upper right forehead likely represents a tiny epidermal inclusion cyst (series 3, image 21). IMPRESSION: No acute intracranial abnormality. Stable mild generalized parenchymal volume loss and chronic small vessel ischemic changes. Electronically Signed   By: Ileana Roup M.D.   On: 01/07/2022  17:49  ? ?CT ABDOMEN PELVIS W CONTRAST ? ?Result Date: 01/07/2022 ?CLINICAL DATA:  Nausea, vomiting and abdominal pain. EXAM: CT ABDOMEN AND PELVIS WITH CONTRAST TECHNIQUE: Multidetector CT imaging of the abdomen and pelvis wa

## 2022-01-07 NOTE — ED Notes (Signed)
Pt refuses to take the additional med ordered dr Doren Custard aware  familly has questions that I cannot answer ? ?

## 2022-01-18 ENCOUNTER — Ambulatory Visit: Payer: Medicare Other | Admitting: Internal Medicine

## 2022-01-27 ENCOUNTER — Other Ambulatory Visit: Payer: Self-pay | Admitting: Internal Medicine

## 2022-01-27 DIAGNOSIS — R000 Tachycardia, unspecified: Secondary | ICD-10-CM

## 2022-01-27 DIAGNOSIS — I10 Essential (primary) hypertension: Secondary | ICD-10-CM

## 2022-02-04 ENCOUNTER — Encounter (HOSPITAL_COMMUNITY): Payer: Self-pay | Admitting: Emergency Medicine

## 2022-02-04 ENCOUNTER — Emergency Department (HOSPITAL_COMMUNITY)
Admission: EM | Admit: 2022-02-04 | Discharge: 2022-02-05 | Disposition: A | Discharge status: Home / Self Care | Payer: Medicare Other | Attending: Emergency Medicine | Admitting: Emergency Medicine

## 2022-02-04 ENCOUNTER — Other Ambulatory Visit: Payer: Self-pay

## 2022-02-04 DIAGNOSIS — Z7951 Long term (current) use of inhaled steroids: Secondary | ICD-10-CM | POA: Insufficient documentation

## 2022-02-04 DIAGNOSIS — N3001 Acute cystitis with hematuria: Secondary | ICD-10-CM | POA: Insufficient documentation

## 2022-02-04 DIAGNOSIS — R739 Hyperglycemia, unspecified: Secondary | ICD-10-CM | POA: Diagnosis not present

## 2022-02-04 DIAGNOSIS — J449 Chronic obstructive pulmonary disease, unspecified: Secondary | ICD-10-CM | POA: Diagnosis not present

## 2022-02-04 DIAGNOSIS — Y901 Blood alcohol level of 20-39 mg/100 ml: Secondary | ICD-10-CM | POA: Insufficient documentation

## 2022-02-04 DIAGNOSIS — I10 Essential (primary) hypertension: Secondary | ICD-10-CM | POA: Insufficient documentation

## 2022-02-04 DIAGNOSIS — F1010 Alcohol abuse, uncomplicated: Secondary | ICD-10-CM | POA: Diagnosis not present

## 2022-02-04 DIAGNOSIS — R000 Tachycardia, unspecified: Secondary | ICD-10-CM | POA: Diagnosis not present

## 2022-02-04 DIAGNOSIS — R7401 Elevation of levels of liver transaminase levels: Secondary | ICD-10-CM | POA: Diagnosis not present

## 2022-02-04 DIAGNOSIS — Z79899 Other long term (current) drug therapy: Secondary | ICD-10-CM | POA: Diagnosis not present

## 2022-02-04 DIAGNOSIS — Z046 Encounter for general psychiatric examination, requested by authority: Secondary | ICD-10-CM | POA: Diagnosis not present

## 2022-02-04 DIAGNOSIS — F1090 Alcohol use, unspecified, uncomplicated: Secondary | ICD-10-CM

## 2022-02-04 DIAGNOSIS — E871 Hypo-osmolality and hyponatremia: Secondary | ICD-10-CM | POA: Insufficient documentation

## 2022-02-04 LAB — URINALYSIS, ROUTINE W REFLEX MICROSCOPIC
Bilirubin Urine: NEGATIVE
Glucose, UA: NEGATIVE mg/dL
Hgb urine dipstick: NEGATIVE
Ketones, ur: NEGATIVE mg/dL
Nitrite: NEGATIVE
Protein, ur: NEGATIVE mg/dL
Specific Gravity, Urine: 1.01 (ref 1.005–1.030)
pH: 6 (ref 5.0–8.0)

## 2022-02-04 LAB — MAGNESIUM: Magnesium: 1.8 mg/dL (ref 1.7–2.4)

## 2022-02-04 LAB — COMPREHENSIVE METABOLIC PANEL
ALT: 41 U/L (ref 0–44)
AST: 48 U/L — ABNORMAL HIGH (ref 15–41)
Albumin: 3.7 g/dL (ref 3.5–5.0)
Alkaline Phosphatase: 73 U/L (ref 38–126)
Anion gap: 12 (ref 5–15)
BUN: 11 mg/dL (ref 8–23)
CO2: 25 mmol/L (ref 22–32)
Calcium: 8.7 mg/dL — ABNORMAL LOW (ref 8.9–10.3)
Chloride: 94 mmol/L — ABNORMAL LOW (ref 98–111)
Creatinine, Ser: 0.78 mg/dL (ref 0.61–1.24)
GFR, Estimated: >60 mL/min (ref >60)
Glucose, Bld: 156 mg/dL — ABNORMAL HIGH (ref 70–99)
Potassium: 4 mmol/L (ref 3.5–5.1)
Sodium: 131 mmol/L — ABNORMAL LOW (ref 135–145)
Total Bilirubin: 0.6 mg/dL (ref 0.3–1.2)
Total Protein: 6.7 g/dL (ref 6.5–8.1)

## 2022-02-04 LAB — CBC
HCT: 46.3 % (ref 39.0–52.0)
Hemoglobin: 16.1 g/dL (ref 13.0–17.0)
MCH: 33.2 pg (ref 26.0–34.0)
MCHC: 34.8 g/dL (ref 30.0–36.0)
MCV: 95.5 fL (ref 80.0–100.0)
Platelets: 249 10*3/uL (ref 150–400)
RBC: 4.85 MIL/uL (ref 4.22–5.81)
RDW: 14.3 % (ref 11.5–15.5)
WBC: 8.2 10*3/uL (ref 4.0–10.5)
nRBC: 0 % (ref 0.0–0.2)

## 2022-02-04 LAB — ACETAMINOPHEN LEVEL: Acetaminophen (Tylenol), Serum: <10 ug/mL — ABNORMAL LOW (ref 10–30)

## 2022-02-04 LAB — AMMONIA: Ammonia: 20 umol/L (ref 9–35)

## 2022-02-04 LAB — RAPID URINE DRUG SCREEN, HOSP PERFORMED
Amphetamines: NOT DETECTED
Barbiturates: NOT DETECTED
Benzodiazepines: NOT DETECTED
Cocaine: NOT DETECTED
Opiates: NOT DETECTED
Tetrahydrocannabinol: NOT DETECTED

## 2022-02-04 LAB — SALICYLATE LEVEL: Salicylate Lvl: <7 mg/dL — ABNORMAL LOW (ref 7.0–30.0)

## 2022-02-04 LAB — ETHANOL: Alcohol, Ethyl (B): 37 mg/dL — ABNORMAL HIGH (ref <10)

## 2022-02-04 LAB — VITAMIN B12: Vitamin B-12: 349 pg/mL (ref 180–914)

## 2022-02-04 MED ORDER — ADULT MULTIVITAMIN W/MINERALS CH
1.0000 | ORAL_TABLET | Freq: Every day | ORAL | Status: DC
  Administered 2022-02-04: 1 via ORAL
  Filled 2022-02-04: qty 1

## 2022-02-04 MED ORDER — THIAMINE HCL 100 MG/ML IJ SOLN: 100.0000 mg | Freq: Every day | INTRAMUSCULAR | Status: DC

## 2022-02-04 MED ORDER — LORAZEPAM 1 MG PO TABS
1.0000 mg | ORAL_TABLET | ORAL | Status: DC | PRN
  Administered 2022-02-04: 1 mg via ORAL
  Filled 2022-02-04: qty 1

## 2022-02-04 MED ORDER — NICOTINE 7 MG/24HR TD PT24
7.0000 mg | MEDICATED_PATCH | Freq: Once | TRANSDERMAL | Status: DC
  Administered 2022-02-04: 7 mg via TRANSDERMAL
  Filled 2022-02-04: qty 1

## 2022-02-04 MED ORDER — LORAZEPAM 2 MG/ML IJ SOLN: 1.0000 mg | INTRAMUSCULAR | Status: DC | PRN

## 2022-02-04 MED ORDER — FOLIC ACID 1 MG PO TABS
1.0000 mg | ORAL_TABLET | Freq: Every day | ORAL | Status: DC
  Administered 2022-02-04: 1 mg via ORAL
  Filled 2022-02-04: qty 1

## 2022-02-04 MED ORDER — THIAMINE HCL 100 MG PO TABS
100.0000 mg | ORAL_TABLET | Freq: Every day | ORAL | Status: DC
  Administered 2022-02-04: 100 mg via ORAL
  Filled 2022-02-04: qty 1

## 2022-02-04 NOTE — ED Provider Notes (Signed)
[facility] EMERGENCY DEPARTMENT Provider Note   CSN: [id number] Arrival date & time: 02/04/22  1439     History Chief Complaint  Patient presents with   IVC    Oscar Phillips is a 66 y.o. male with h/o alcohol use disorder, HTN, HLD, COPD, hyponatremia presents to the ED for evaluation of IVC for alcohol use disorder. The patient reports he was taken to the ER because his siblings don't think that he can take care of himself because of his alcohol use. The patient reports that he doesn't agree with this. He is not looking for detox or rehab and does not want to seek treatment at this time. He denies any confusion, tremors, seizures, hallucinations, SI, HI, chest pain, SOB, abdominal pain, nausea, vomiting, or fevers.  Patient reports he approximately drinks 1 bottle of wine per day.  He denies any history of DT or seizures.  I spoke with the patient's siblings. His brother, Oscar Phillips, was the one submitting the initial IVC paperwork. From his point of view, he does not think the patient is taking care of himself because of his alcohol use and wants him to go to a detox or treatment center. He mentioned in the initial IVC paperwork that the patient passively mentioned SI, but the brother denies that the patient told him this. He is not concerned for any suicidality. He denies that the patient has expressed any hallucinations to him. He reports that the patient isn't compliant with his medications and it is concerning him.   HPI     Home Medications Prior to Admission medications   Medication Sig Start Date End Date Taking? Authorizing Provider  albuterol (VENTOLIN HFA) 108 (90 Base) MCG/ACT inhaler Inhale 2 puffs into the lungs every 6 (six) hours as needed for wheezing or shortness of breath. 04/04/21   Etta Grandchild, MD  Cholecalciferol (VITAMIN D3) 250 MCG (10000 UT) capsule Take 10,000 Units by mouth daily.    [provider]  EDARBI 80 MG TABS TAKE 1 TABLET(80  MG) BY MOUTH DAILY 10/29/21   Etta Grandchild, MD  imipramine (TOFRANIL) 25 MG tablet Take 25 mg by mouth daily.    [provider]  Multiple Vitamin (MULTIVITAMIN WITH MINERALS) TABS tablet Take 1 tablet by mouth daily. Centrum    [provider]  nebivolol (BYSTOLIC) 5 MG tablet TAKE 1 TABLET(5 MG) BY MOUTH DAILY 01/27/22   Etta Grandchild, MD  nicotine (NICODERM CQ - DOSED IN MG/24 HOURS) 14 mg/24hr patch Place 1 patch (14 mg total) onto the skin daily. Patient not taking: Reported on 09/16/2021 09/04/21   Calvert Cantor, MD  nicotine (NICODERM CQ) 7 mg/24hr patch Place 1 patch (7 mg total) onto the skin daily. Start after 14 mg patches have finished Patient not taking: Reported on 09/16/2021 09/03/21   Calvert Cantor, MD  thiamine (VITAMIN B-1) 100 MG tablet Take 1 Tablet BY MOUTH TWICE DAILY for 14 days Patient taking differently: Take 100 mg by mouth daily. 04/04/21   Etta Grandchild, MD  TRELEGY ELLIPTA 100-62.5-25 MCG/ACT AEPB INHALE 1 PUFF INTO THE LUNGS DAILY Patient taking differently: Inhale 1 puff into the lungs daily. 08/05/21   Etta Grandchild, MD      Allergies    Lisinopril and Penicillins    Review of Systems   Review of Systems  Constitutional:  Negative for chills and fever.  Respiratory:  Negative for shortness of breath.   Cardiovascular:  Negative for chest pain.  Gastrointestinal:  Negative for abdominal pain, constipation, diarrhea, nausea and vomiting.  Genitourinary:  Negative for dysuria, hematuria, penile pain, penile swelling, scrotal swelling and testicular pain.  Neurological:  Negative for seizures, syncope and headaches.  Psychiatric/Behavioral:  Negative for dysphoric mood, hallucinations and suicidal ideas.    Physical Exam Updated Vital Signs BP (!) 153/76   Pulse (!) 106   Temp 98.2 F (36.8 C) (Oral)   Resp 15   SpO2 96%  Physical Exam Vitals and nursing note reviewed.  Constitutional:      General: He is not in acute distress.     Appearance: Normal appearance. He is not ill-appearing or toxic-appearing.  HENT:     Head: Normocephalic and atraumatic.     Mouth/Throat:     Mouth: Mucous membranes are moist.  Eyes:     General: No scleral icterus.    Extraocular Movements: Extraocular movements intact.     Pupils: Pupils are equal, round, and reactive to light.  Cardiovascular:     Rate and Rhythm: Regular rhythm. Tachycardia present.  Pulmonary:     Effort: Pulmonary effort is normal. No respiratory distress.     Breath sounds: Normal breath sounds.  Abdominal:     General: Bowel sounds are normal.     Palpations: Abdomen is soft.     Tenderness: There is no abdominal tenderness. There is no guarding or rebound.  Musculoskeletal:        General: No deformity.     Cervical back: Normal range of motion.  Skin:    General: Skin is warm and dry.  Neurological:     General: No focal deficit present.     Mental Status: He is alert. Mental status is at baseline.     GCS: GCS eye subscore is 4. GCS verbal subscore is 5. GCS motor subscore is 6.     Cranial Nerves: Cranial nerves 2-12 are intact. No dysarthria or facial asymmetry.     Sensory: No sensory deficit.     Coordination: Finger-Nose-Finger Test normal.     Gait: Gait is intact.     Comments: A&Ox4.  GCS 15.  Cranial nerves II through XII visual asymmetry.  Answering questions appropriate with appropriate speech.  Normal finger-to-nose.  Strength is 5 out of 5 in upper and lower bilateral extremities.  Intact gait.  Slight tremor in hands.  No asterixis.  Psychiatric:        Attention and Perception: Attention normal.        Speech: Speech normal.        Behavior: Behavior is cooperative.        Thought Content: Thought content does not include homicidal or suicidal ideation. Thought content does not include homicidal or suicidal plan.     Comments: Does not appear to be responding to any internal stimuli.    ED Results / Procedures / Treatments    Labs (all labs ordered are listed, but only abnormal results are displayed) Labs Reviewed  COMPREHENSIVE METABOLIC PANEL - Abnormal; Notable for the following components:      Result Value   Sodium 131 (*)    Chloride 94 (*)    Glucose, Bld 156 (*)    Calcium 8.7 (*)    AST 48 (*)    All other components within normal limits  ETHANOL - Abnormal; Notable for the following components:   Alcohol, Ethyl (B) 37 (*)    All other components within normal limits  SALICYLATE LEVEL - Abnormal; Notable  for the following components:   Salicylate Lvl <7.0 (*)    All other components within normal limits  ACETAMINOPHEN LEVEL - Abnormal; Notable for the following components:   Acetaminophen (Tylenol), Serum <10 (*)    All other components within normal limits  CBC  AMMONIA  MAGNESIUM  VITAMIN B12  RAPID URINE DRUG SCREEN, HOSP PERFORMED  URINALYSIS, ROUTINE W REFLEX MICROSCOPIC    EKG None  Radiology No results found.  Procedures Procedures   Medications Ordered in ED Medications  LORazepam (ATIVAN) tablet 1-4 mg (1 mg Oral Given 02/04/22 1656)    Or  LORazepam (ATIVAN) injection 1-4 mg ( Intravenous See Alternative 02/04/22 1656)  thiamine tablet 100 mg (100 mg Oral Given 02/04/22 1655)    Or  thiamine (B-1) injection 100 mg ( Intravenous See Alternative 02/04/22 1655)  folic acid (FOLVITE) tablet 1 mg (1 mg Oral Given 02/04/22 1656)  multivitamin with minerals tablet 1 tablet (1 tablet Oral Given 02/04/22 1656)    ED Course/ Medical Decision Making/ A&P                           Medical Decision Making Amount and/or Complexity of Data Reviewed Labs: ordered.  Risk OTC drugs. Prescription drug management.   66 y/o M presents to the ER for IVC. Vital signs show hypertension at 174/91, mild tachycardia at 108, a febrile, satting 94% on room air. Physical exam shows tachycardia, otherwise normal.  Does not appear to be responding to any internal stimuli. A&Ox4.  GCS 15.   Cranial nerves II through XII visual asymmetry.  Answering questions appropriate with appropriate speech.  Normal finger-to-nose.  Strength is 5 out of 5 in upper and lower bilateral extremities.  Intact gait. Slight tremor in hands.  No asterixis.  He denies any HI, SI, or any hallucinations.  He does not appear to be responding to any internal stimuli.   I independently reviewed and interpreted the patient's labs.  CMP shows mild hyponatremia 131 although appears consistent with patient's previous labs.  Chloride at 94.  Elevated glucose at 156 although patient is not fasting.  Slightly decrease in calcium at 8.7.  Mildly elevated AST at 48.  CBC shows no leukocytosis or anemia.  Negative acetaminophen and salicylate levels.  Ammonia normal at 20.  Magnesium normal at 1.8.  Vitamin B12 normal.  Urinalysis shows hazy urine with moderate leukocytes and rare bacteria, there are 21-50 white blood cells and 6-10 red blood cells seen however.  There is also mucus, hyaline casts, non-squamous epithelial, and sperm present.  His ethanol is elevated at 37 although patient appears clinically sober.  Patient was requesting nicotine patch which was ordered.  Because of his history of alcohol use, CIWA protocols were placed.  He was given folic acid, multivitamin, and thiamine.  Overall, he was given 1 mg of lorazepam.  On prior chart evaluation, there is concern for possible bladder malignancy and he was told to follow-up with urology.  He has not followed up with urology.  He currently denies any dysuria hematuria, however I will empirically treat for UTI.  Urine culture obtained.  The patient is tachycardic in the low 100s which is typical for him in comparison to his previous charts with 98, 106, and 103 with his last ED/outpatient visits.  Given the conversation with the primary inpatient as well is moderate physical exam and interview findings, I do not see a reason to hold at this  IVC.  The patient does not  have any suicidal ideation, homicidal ideations, or any hallucinations.  The brother denies that the patient expressed any suicidal need to have or any homicidal intentions or any hallucinations.  The IVC was taken for the patient to be involuntary placed in a rehab facility for detox and alcohol.  The patient is not wishing to seek treatment at this time.  I have low concern for any suicidality in the patient as he has never had these admissions before.  He does have a history of anxiety she was seen for in 2019.  At that time as well, he denies any suicidal ideations.  Given the patient's stable lab work as well as physical exam, he appears clinically sober and is safe for discharge.  The IVC was rescinded.  He appears clinically sober and does not wish to seek treatment at this time. I did provide resources for detox and rehab centers if he changes his mind.  Place patient on cefpodoxime twice daily for the next 7 days for his possible UTI.  We discussed strict return precautions.  Patient verbalized understanding agrees to plan.  The patient is stable and being discharged home in good condition.  Most likely charting error with temperature saying 46F as his pulse ox was 93%. On my evaluation before discharge, he was A&Ox3, NAD, skin was warm and dry.   I discussed this case with my attending physician who cosigned this note including patient's presenting symptoms, physical exam, and planned diagnostics and interventions. Attending physician stated agreement with plan or made changes to plan which were implemented.   Final Clinical Impression(s) / ED Diagnoses Final diagnoses:  Alcohol use disorder  Acute cystitis with hematuria    Rx / DC Orders ED Discharge Orders          Ordered    cefpodoxime (VANTIN) 200 MG tablet  2 times daily        02/05/22 0018              Achille Rich, PA-C 02/06/22 1456    Franne Forts, DO 02/09/22 2306

## 2022-02-04 NOTE — ED Notes (Signed)
Lunch tray ordered 

## 2022-02-04 NOTE — Discharge Instructions (Addendum)
You were seen here today at the request of your family for alcohol detox which is something you do not wish at this time. I have included information on rehab facilities if you change your mind. Additionally, your urine was suspicious for a UTI. I am sending you home with an antibiotic to take twice daily for the next 7 days. If you have any fevers, abdominal pain, dark emesis, dark stools, or blood in your urine, please return to the nearest ER for evaluation.  ? ?**Additionally, your brother would like for me to remind you that you have a key in your mailbox.  ? ?Contact a health care provider if: ?You cannot take your medicines as told. ?Your symptoms get worse or you experience symptoms of withdrawal when you stop drinking. ?You start drinking again (relapse) and your symptoms get worse. ?Get help right away if: ?You have thoughts about hurting yourself or others. ?If you ever feel like you may hurt yourself or others, or have thoughts about taking your own life, get help right away. Go to your nearest emergency department or: ?Call your local emergency services (911 in the [place] ?Call a suicide crisis helpline, such as the [facility] at 612-497-9745 or 988 in the [place]. This is open 24 hours a day in the [place] ?Text the Crisis Text Line at (208)555-2503 (in the [place].). ? ? ? ?               Intensive Outpatient Programs ? ?[facility] ?[place]     [address] ?[place],  [place], [place] ?[place] ? ?[place]  ?(Inpatient and outpatient)  [phone] (Suboxone and Methadone) ?[address]           ?[phone]          ? ?ADS: Alcohol & Drug Services    [facility] - Intensive Outpatient ?[address] ?[place], [place] [address]     [place], [place]  ?[place] ? ?[facility]  [place] (Outpatient, Inpatient, [facility]  [facility] (Groups and [facility]idental) ?(insurance only) [phone]    [place], [place]   ?       [phone] ? ?     ?[facility] (for caregivers and family) ?[address] ?[place], [place], [place] ?[place]      [phone] ? ?Residential Treatment Programs ? ?[facility]  ?[facility](2 years) Residential: 90 days)  [facility] ([place] ?[place] ?[place], [place], [place] ?[phone]      [phone] or [phone] ? ?[facility] ?[place] ?[place], [place], [place] ?[phone]      [phone] ? ?[place]   [facility] ([facility]) ?[place]     [address] ?[place], [place] [address]     [place], [place] ?[place] ?Admissions: 8am-3pm M-F ? ?[facility]: Residential  Program (90 Days)              [facility]: [facility]  ?[place], [place], [place]  ?[phone] or [phone]    (Walk in Hours over the weekend or by referral) ? ? ?[facility]: [facility]:[phone] (for crisis response 24 hours a day) ?

## 2022-02-04 NOTE — ED Notes (Signed)
Pt changed into burgundy scrubs, belongings bagged and labeled, and security called to wand pt.   ?

## 2022-02-04 NOTE — ED Triage Notes (Signed)
Pt to triage via [employer] from home with IVC papers that were taken out by his brother.  Family reports he is abusing alcohol.  Reports "detecting scents" that no one else can detect.  Hx of OCD, depression, and panic disorder.  Does not take his medications.  Per IVC papers pt does not want to live anymore but does give a plan to kill himself.   ? ?Pt denies SI/HI and states he is here for "COPD".  Denies increased SOB.  Denies pain.  Denies AH/VH. ?

## 2022-02-04 NOTE — ED Notes (Signed)
PSO contacted for return of personal beloningings ?

## 2022-02-04 NOTE — Care Management (Addendum)
SA resources to  discharge instructions/AVS ?

## 2022-02-05 MED ORDER — CEFPODOXIME PROXETIL 200 MG PO TABS: 200.0000 mg | ORAL_TABLET | Freq: Two times a day (BID) | ORAL | 0 refills | Status: AC

## 2022-02-05 NOTE — ED Notes (Signed)
Patient returned personal belongings and cell phone valuable. Patient signed forms upon return of belongings. D/c paperwork reviewed with patient. All questions answered at this time.  ?

## 2022-02-07 ENCOUNTER — Ambulatory Visit: Payer: Medicare Other | Admitting: Physician Assistant

## 2022-02-07 LAB — GC/CHLAMYDIA PROBE AMP (~~LOC~~) NOT AT ARMC
Chlamydia: NEGATIVE
Comment: NEGATIVE
Comment: NORMAL
Neisseria Gonorrhea: NEGATIVE

## 2022-03-29 ENCOUNTER — Inpatient Hospital Stay: Payer: Medicare Other | Admitting: Internal Medicine

## 2022-04-27 ENCOUNTER — Other Ambulatory Visit: Payer: Self-pay | Admitting: Internal Medicine

## 2022-04-27 ENCOUNTER — Ambulatory Visit: Payer: Medicare Other | Admitting: Physician Assistant

## 2022-04-27 DIAGNOSIS — R000 Tachycardia, unspecified: Secondary | ICD-10-CM

## 2022-04-27 DIAGNOSIS — I10 Essential (primary) hypertension: Secondary | ICD-10-CM

## 2022-04-27 DIAGNOSIS — I2510 Atherosclerotic heart disease of native coronary artery without angina pectoris: Secondary | ICD-10-CM

## 2022-05-01 ENCOUNTER — Other Ambulatory Visit: Payer: Self-pay | Admitting: Internal Medicine

## 2022-05-01 DIAGNOSIS — J449 Chronic obstructive pulmonary disease, unspecified: Secondary | ICD-10-CM

## 2022-05-02 ENCOUNTER — Ambulatory Visit (INDEPENDENT_AMBULATORY_CARE_PROVIDER_SITE_OTHER): Payer: Medicare Other | Admitting: Physician Assistant

## 2022-05-02 ENCOUNTER — Encounter: Payer: Self-pay | Admitting: Physician Assistant

## 2022-05-02 DIAGNOSIS — D485 Neoplasm of uncertain behavior of skin: Secondary | ICD-10-CM

## 2022-05-02 DIAGNOSIS — L820 Inflamed seborrheic keratosis: Secondary | ICD-10-CM

## 2022-05-02 DIAGNOSIS — I2510 Atherosclerotic heart disease of native coronary artery without angina pectoris: Secondary | ICD-10-CM | POA: Diagnosis not present

## 2022-05-02 DIAGNOSIS — L720 Epidermal cyst: Secondary | ICD-10-CM | POA: Diagnosis not present

## 2022-05-02 NOTE — Patient Instructions (Signed)

## 2022-05-05 ENCOUNTER — Encounter: Payer: Self-pay | Admitting: Physician Assistant

## 2022-05-05 NOTE — Progress Notes (Signed)
   Follow-Up Visit   Subjective  Oscar Phillips is a 66 y.o. male who presents for the following: Skin Problem (Patient here today for recurrent lesion on his right jaw line that's been removed in the past and one lesion on his forehead x unsure. Per patient no bleeding no pain from either lesion. ).   The following portions of the chart were reviewed this encounter and updated as appropriate:  Tobacco  Allergies  Meds  Problems  Med Hx  Surg Hx  Fam Hx      Objective  Well appearing patient in no apparent distress; mood and affect are within normal limits.  All skin waist up examined.  Mid Forehead Rubbery, immobile nodule.   Right Posterior Mandible Brown crust        Assessment & Plan  Epidermal cyst Mid Forehead  Schedule surgery if wants removed  Neoplasm of uncertain behavior of skin Right Posterior Mandible  Skin / nail biopsy Type of biopsy: tangential   Informed consent: discussed and consent obtained   Timeout: patient name, date of birth, surgical site, and procedure verified   Procedure prep:  Patient was prepped and draped in usual sterile fashion (Non sterile) Prep type:  Chlorhexidine Anesthesia: the lesion was anesthetized in a standard fashion   Anesthetic:  1% lidocaine w/ epinephrine 1-100,000 local infiltration Instrument used: flexible razor blade   Outcome: patient tolerated procedure well   Post-procedure details: wound care instructions given    Destruction of lesion Complexity: simple   Destruction method: electrodesiccation and curettage   Informed consent: discussed and consent obtained   Timeout:  patient name, date of birth, surgical site, and procedure verified Anesthesia: the lesion was anesthetized in a standard fashion   Anesthetic:  1% lidocaine w/ epinephrine 1-100,000 local infiltration Curettage performed in three different directions: Yes   Electrodesiccation performed over the curetted area: Yes   Curettage  cycles:  3 Margin per side (cm):  0.1 Final wound size (cm):  2.1 Hemostasis achieved with:  aluminum chloride Outcome: patient tolerated procedure well with no complications   Post-procedure details: wound care instructions given    Specimen 1 - Surgical pathology Differential Diagnosis: bcc vs scc, wart- txpbx  Check Margins: yes    I, Mineola Duan, PA-C, have reviewed all documentation's for this visit.  The documentation on 05/05/22 for the exam, diagnosis, procedures and orders are all accurate and complete.

## 2022-05-12 ENCOUNTER — Telehealth: Payer: Self-pay | Admitting: Internal Medicine

## 2022-05-12 DIAGNOSIS — I10 Essential (primary) hypertension: Secondary | ICD-10-CM

## 2022-05-12 DIAGNOSIS — I2510 Atherosclerotic heart disease of native coronary artery without angina pectoris: Secondary | ICD-10-CM

## 2022-05-12 MED ORDER — EDARBI 80 MG PO TABS: ORAL_TABLET | ORAL | 0 refills | Status: DC

## 2022-05-12 NOTE — Telephone Encounter (Signed)
Notified pt Per policy sent #[phone] until appt 06/22/22./lmb

## 2022-05-12 NOTE — Telephone Encounter (Signed)
Caller & Relationship to patient: Oscar Phillips  Call back number: [phone]  Date of last office visit: 12/15/21  Date of next office visit: 06/22/22  Medication(s) to be refilled:  EDARBI 80 MG TABS  Preferred Pharmacy:  [id number] [place] - [address] AT [place] Phone:  [phone]  Fax:  [phone]

## 2022-05-22 ENCOUNTER — Other Ambulatory Visit: Payer: Self-pay | Admitting: Internal Medicine

## 2022-05-22 ENCOUNTER — Telehealth: Payer: Self-pay

## 2022-05-22 DIAGNOSIS — R000 Tachycardia, unspecified: Secondary | ICD-10-CM

## 2022-05-22 DIAGNOSIS — I10 Essential (primary) hypertension: Secondary | ICD-10-CM

## 2022-05-22 DIAGNOSIS — F411 Generalized anxiety disorder: Secondary | ICD-10-CM

## 2022-05-22 DIAGNOSIS — I2510 Atherosclerotic heart disease of native coronary artery without angina pectoris: Secondary | ICD-10-CM

## 2022-05-22 DIAGNOSIS — J449 Chronic obstructive pulmonary disease, unspecified: Secondary | ICD-10-CM

## 2022-05-22 DIAGNOSIS — F332 Major depressive disorder, recurrent severe without psychotic features: Secondary | ICD-10-CM

## 2022-05-22 MED ORDER — NEBIVOLOL HCL 5 MG PO TABS: ORAL_TABLET | ORAL | 0 refills | Status: DC

## 2022-05-22 MED ORDER — GABAPENTIN 300 MG PO CAPS: 300.0000 mg | ORAL_CAPSULE | Freq: Three times a day (TID) | ORAL | 0 refills | Status: AC

## 2022-05-22 MED ORDER — EDARBI 80 MG PO TABS: ORAL_TABLET | ORAL | 0 refills | Status: AC

## 2022-05-22 MED ORDER — TRELEGY ELLIPTA 100-62.5-25 MCG/ACT IN AEPB: 1.0000 | INHALATION_SPRAY | Freq: Every day | RESPIRATORY_TRACT | 0 refills | Status: AC

## 2022-05-22 MED ORDER — ESCITALOPRAM OXALATE 10 MG PO TABS: 10.0000 mg | ORAL_TABLET | Freq: Every day | ORAL | 0 refills | Status: AC

## 2022-05-22 MED ORDER — TRAZODONE HCL 100 MG PO TABS: 200.0000 mg | ORAL_TABLET | Freq: Every evening | ORAL | 0 refills | Status: AC | PRN

## 2022-05-22 NOTE — Telephone Encounter (Signed)
Pt is requesting a 30 day supply of medications as [place] has [place] different [place]. Pt first appt with new PCP is 06/19/22.  Pt is requesting these medications for 1 month supply: gabapentin (NEURONTIN) 300 MG capsule traZODone (DESYREL) 100 MG tablet escitalopram (LEXAPRO) 10 MG tablet nebivolol (BYSTOLIC) 5 MG tablet Azilsartan Medoxomil (EDARBI) 80 MG TABS  Pharmacy: [id number] - [place], [place] - [address] AT [place]

## 2022-06-22 ENCOUNTER — Ambulatory Visit: Payer: Medicare Other | Admitting: Internal Medicine

## 2022-07-05 ENCOUNTER — Other Ambulatory Visit: Payer: Self-pay | Admitting: Internal Medicine

## 2022-07-05 DIAGNOSIS — I10 Essential (primary) hypertension: Secondary | ICD-10-CM

## 2022-07-05 DIAGNOSIS — R000 Tachycardia, unspecified: Secondary | ICD-10-CM

## 2022-08-01 ENCOUNTER — Other Ambulatory Visit: Payer: Self-pay | Admitting: Internal Medicine

## 2022-08-01 DIAGNOSIS — I10 Essential (primary) hypertension: Secondary | ICD-10-CM

## 2022-08-01 DIAGNOSIS — I2510 Atherosclerotic heart disease of native coronary artery without angina pectoris: Secondary | ICD-10-CM

## 2022-08-02 ENCOUNTER — Other Ambulatory Visit: Payer: Self-pay | Admitting: Internal Medicine

## 2022-08-02 DIAGNOSIS — I2510 Atherosclerotic heart disease of native coronary artery without angina pectoris: Secondary | ICD-10-CM

## 2022-08-02 DIAGNOSIS — I10 Essential (primary) hypertension: Secondary | ICD-10-CM

## 2022-10-19 IMAGING — CT CT ABD-PELV W/ CM
2 of 5 series · 14 of 46 positions shown, 16 images · IV contrast (APPLIED)
Comparison: CT angio chest 03/25/2020

CLINICAL DATA: Nausea, vomiting and abdominal pain.

EXAM:
CT ABDOMEN AND PELVIS WITH CONTRAST
TECHNIQUE: Multidetector CT imaging of the abdomen and pelvis was performed
using the standard protocol following bolus administration of
intravenous contrast.

[Series 5: abdomen 5.0 · axial · 0.82mm/px · z∈[-1110,-760]mm · 11 of 86 slices shown, 13 images]
[im 8/86  soft-tissue]
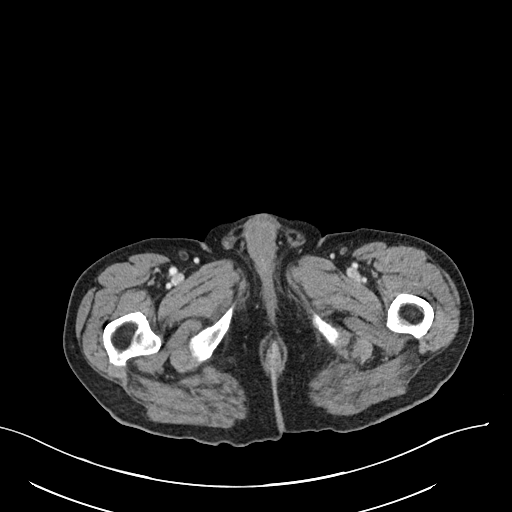
[im 8/86  bone]
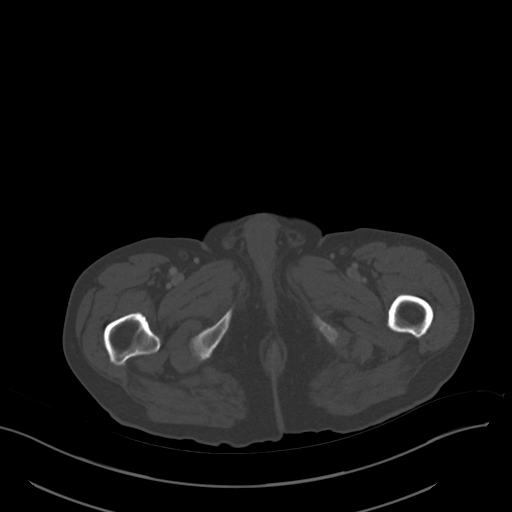
[im 15/86  soft-tissue]
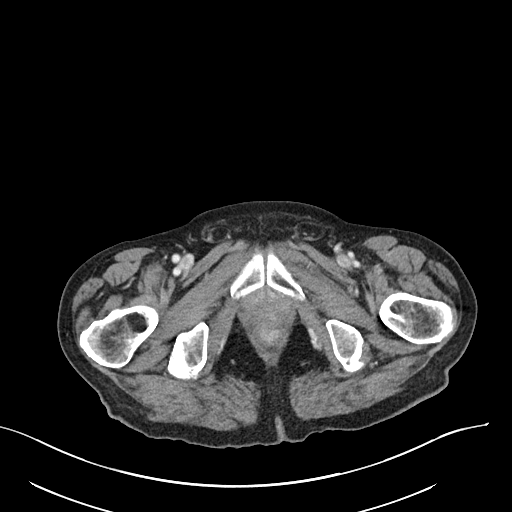
[im 22/86  soft-tissue]
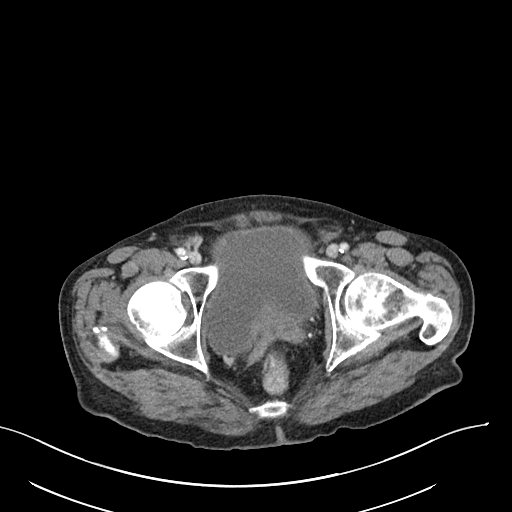
[im 29/86  soft-tissue]
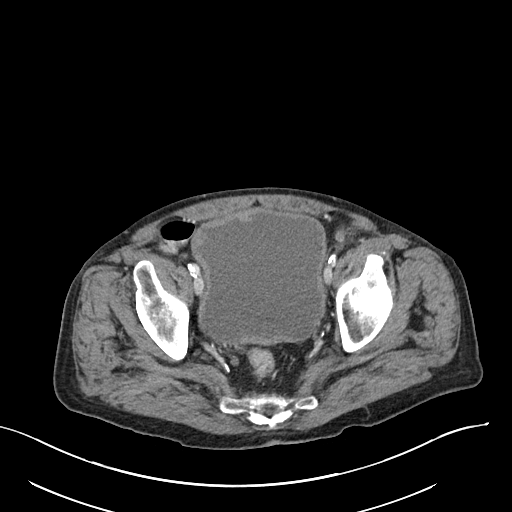
[im 36/86  soft-tissue]
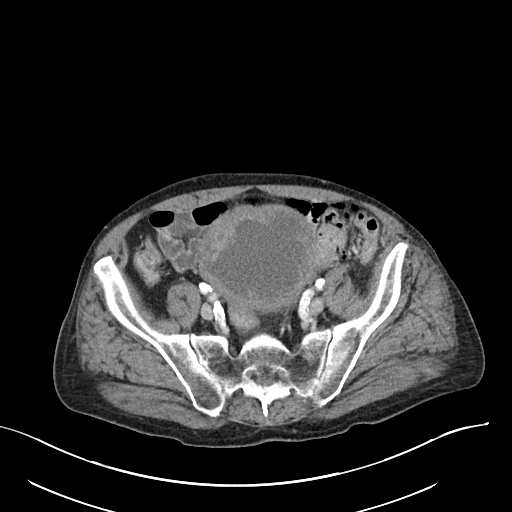
[im 43/86  soft-tissue]
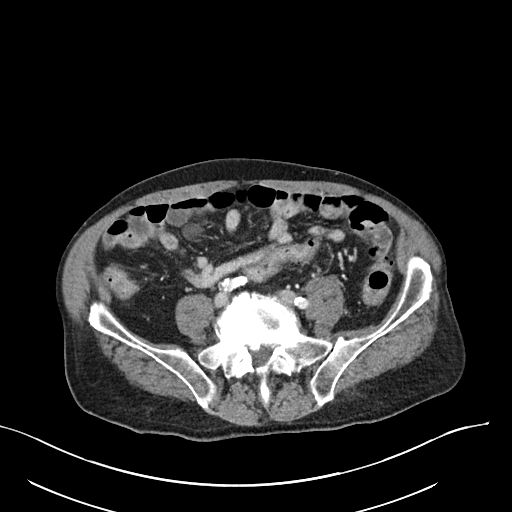
[im 50/86  soft-tissue]
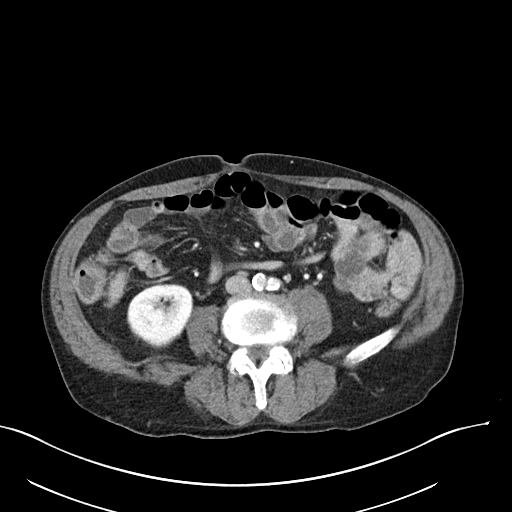
[im 57/86  soft-tissue]
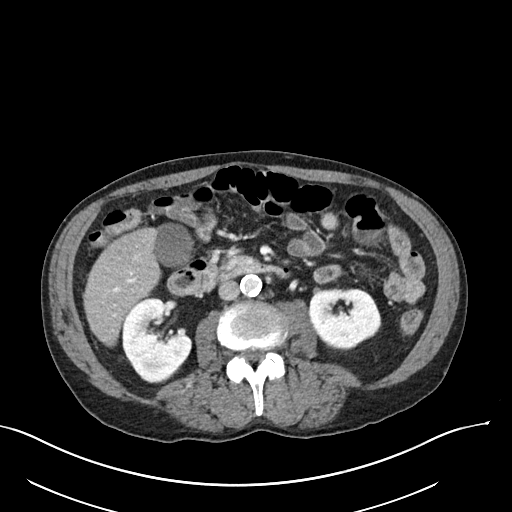
[im 64/86  soft-tissue]
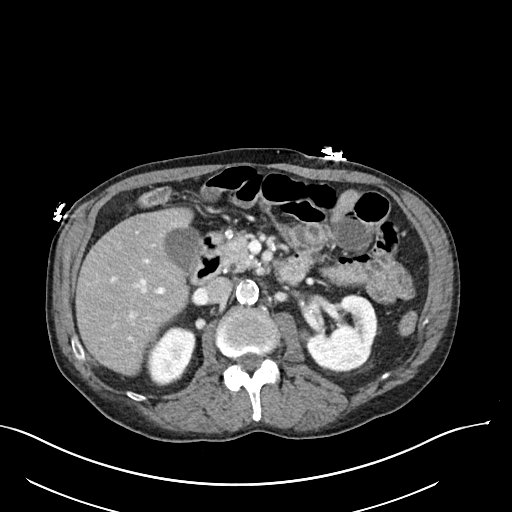
[im 64/86  bone]
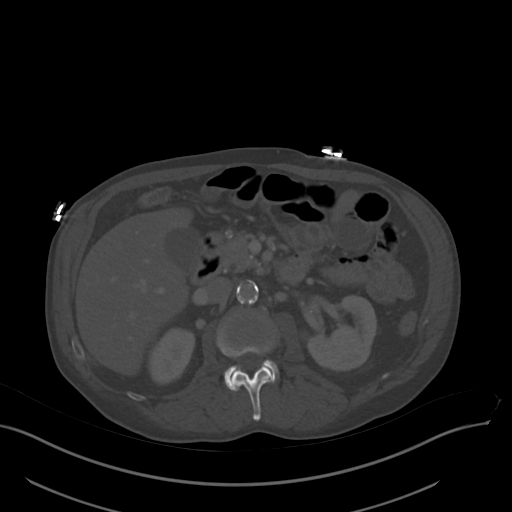
[im 71/86  soft-tissue]
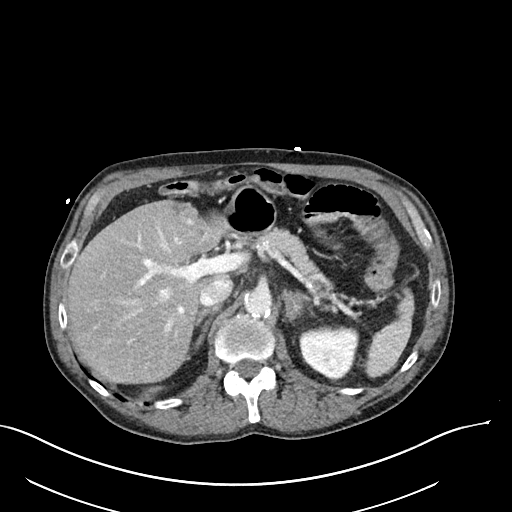
[im 78/86  soft-tissue]
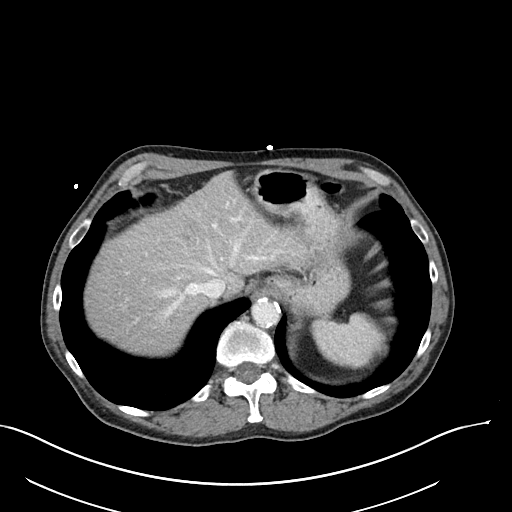

[Series 9: abdomen 3.0 mpr cor · coronal · 0.75mm/px · 3 of 81 slices shown]
[im 27/81  soft-tissue]
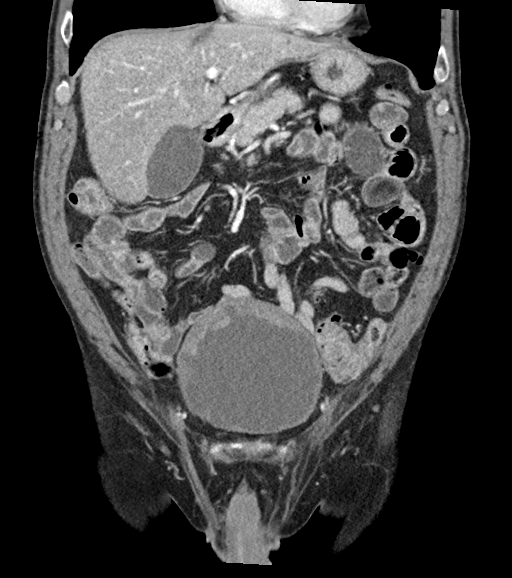
[im 36/81  soft-tissue]
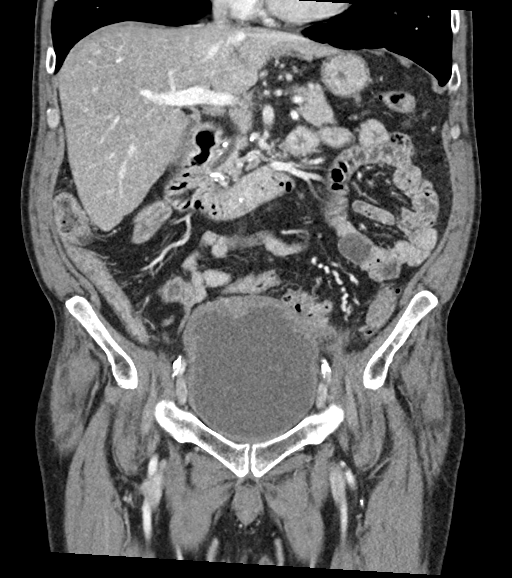
[im 45/81  soft-tissue]
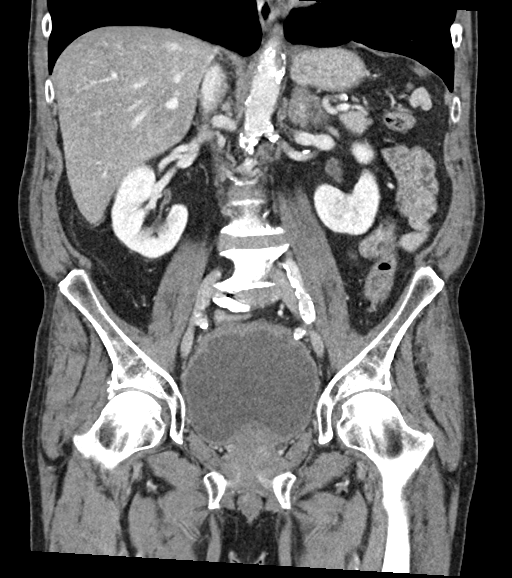

[14 of 46 positions shown; findings below may reference images not displayed]

RADIATION DOSE REDUCTION: This exam was performed according to the
departmental dose-optimization program which includes automated
exposure control, adjustment of the mA and/or kV according to
patient size and/or use of iterative reconstruction technique.

CONTRAST:  100mL OMNIPAQUE IOHEXOL 300 MG/ML  SOLN
FINDINGS: Lower chest: No acute findings.  Centrilobular emphysema noted.

Hepatobiliary: Unchanged 2.2 x 1.1 cm low-density structure within
segment 4 B compared with 03/25/2020. Also unchanged is a
indeterminate low-density structure within the posterior dome of
liver measuring 1.4 x 1.0 cm. Given the relative stability these are
compatible with a benign process. No suspicious liver lesions noted
at this time. Gallbladder is unremarkable. No bile duct dilatation.

Pancreas: Unremarkable. No pancreatic ductal dilatation or
surrounding inflammatory changes.

Spleen: Normal in size without focal abnormality.

Adrenals/Urinary Tract: No focal adrenal mass. Right kidney appears
normal. Benign Bosniak class 1 cyst arises off the posterior cortex
of the interpolar left kidney measuring 1.6 cm. No suspicious mass,
nephrolithiasis or hydronephrosis.

The bladder is diffusely distended measuring 10.1 x 10.8 by 11.2 cm.
There is diffuse bladder wall thickening and trabeculation
identified. Along the right side of the dome of bladder there is
more focal thickening which measures up to 1.7 cm. Cannot exclude
underlying urothelial neoplasm.

Stomach/Bowel: The stomach appears normal. The appendix is not
confidently identified. No secondary signs of acute appendicitis. No
pericecal inflammation. No bowel wall thickening, inflammation, or
distension. Sigmoid diverticulosis is noted without signs of acute
diverticulitis.

Vascular/Lymphatic: Aortic atherosclerosis. The upper abdominal
vascularity is patent. No abdominopelvic adenopathy.

Reproductive: Prostate gland measures 4.5 x 3.6 x 4.2 cm (volume =
36 cm^3). There is mass effect upon the base of bladder.

Other: No ascites or focal fluid collections identified.

Musculoskeletal: No acute or significant osseous findings.
Degenerative disc disease identified at L5-S1.
IMPRESSION: 1. No acute findings identified within the abdomen or pelvis.
2. Diffuse bladder wall thickening and trabeculation with more focal
thickening along the right side of the dome of bladder. Cannot
exclude underlying urothelial neoplasm. Recommend further evaluation
with direct visualization.
3. Sigmoid diverticulosis without signs of acute diverticulitis.
4. Aortic Atherosclerosis (70OJC-VVF.F) and Emphysema ([id number]-3AH.D).

## 2022-10-19 IMAGING — CT CT HEAD W/O CM
4 series · 16 of 47 positions shown, 18 images · non-contrast
Comparison: CT head 08/31/2021

CLINICAL DATA: Mental status change. Patient has not been feeling
well for 1 week. Unable to sleep for several months.



[Series 3: head wo · axial · 0.47mm/px · z∈[-68,+52]mm · 7 of 33 slices shown, 9 images]
[im 5/33  brain]
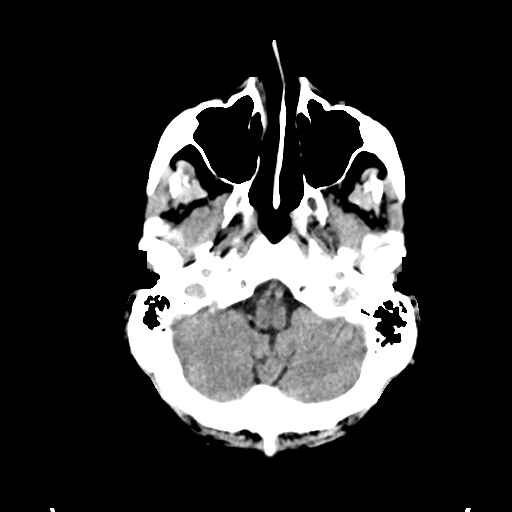
[im 5/33  bone]
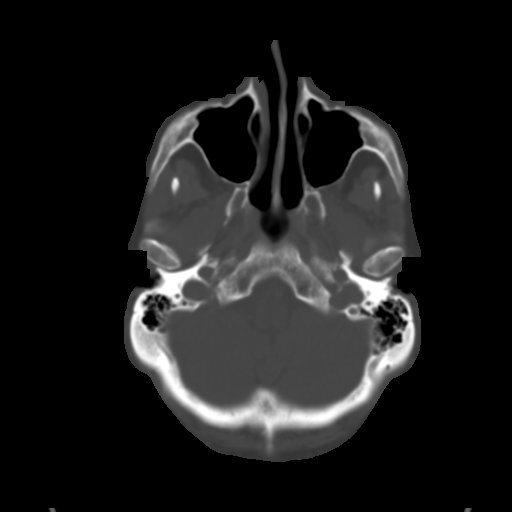
[im 9/33  brain]
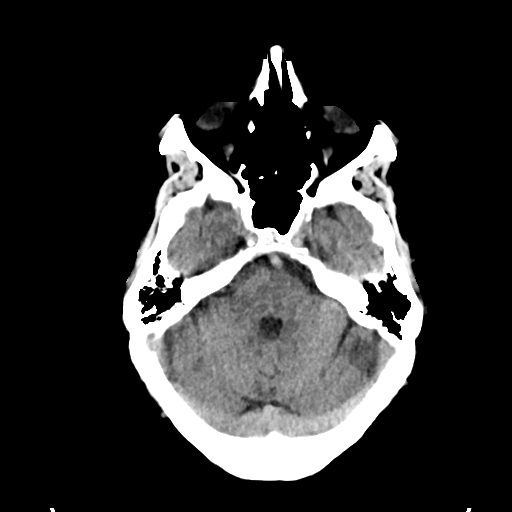
[im 13/33  brain]
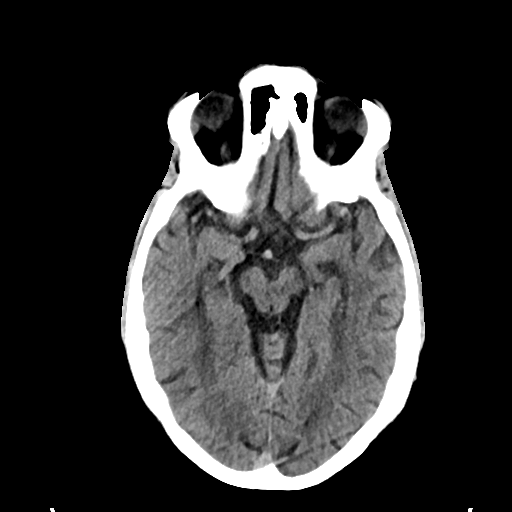
[im 17/33  brain]
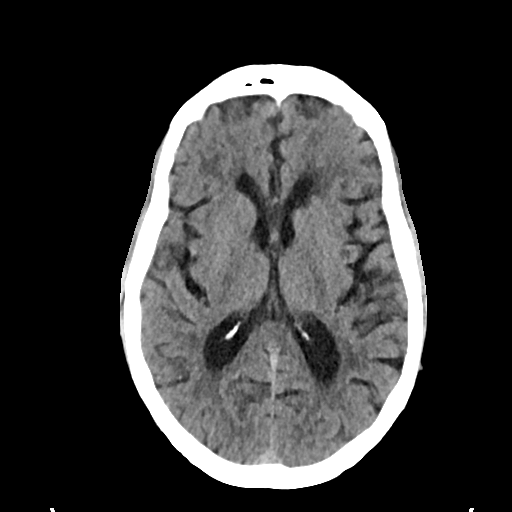
[im 21/33  brain]
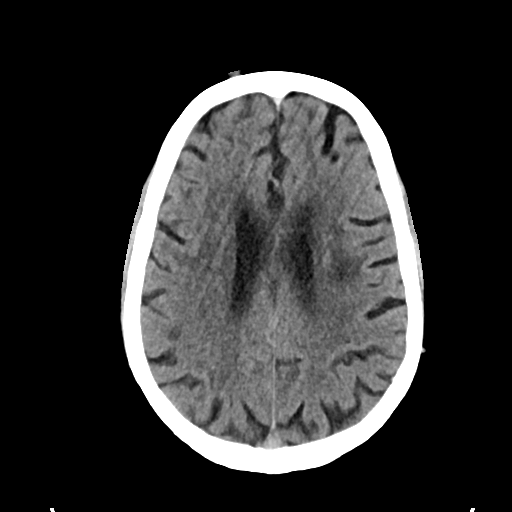
[im 21/33  bone]
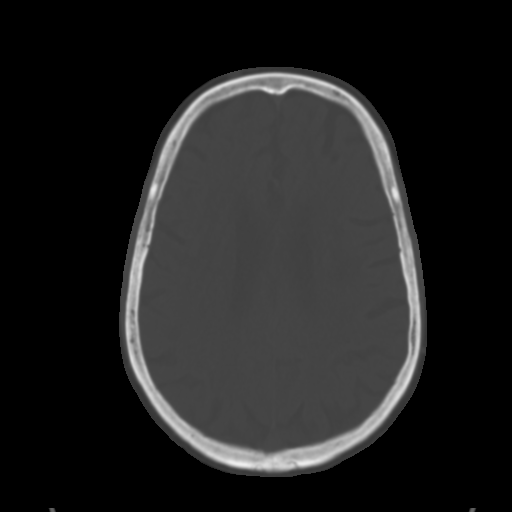
[im 25/33  brain]
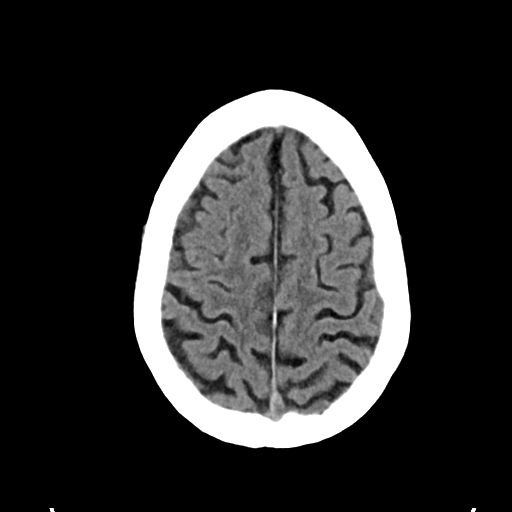
[im 29/33  brain]
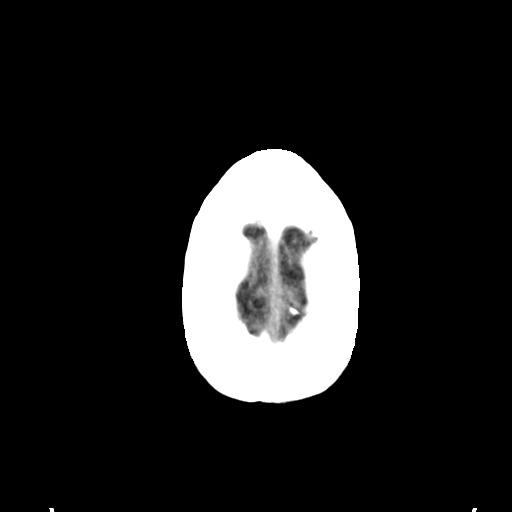

[Series 4: head bone · axial · 0.47mm/px · z∈[-72,-40]mm · 3 of 81 slices shown]
[im 9/81  bone]
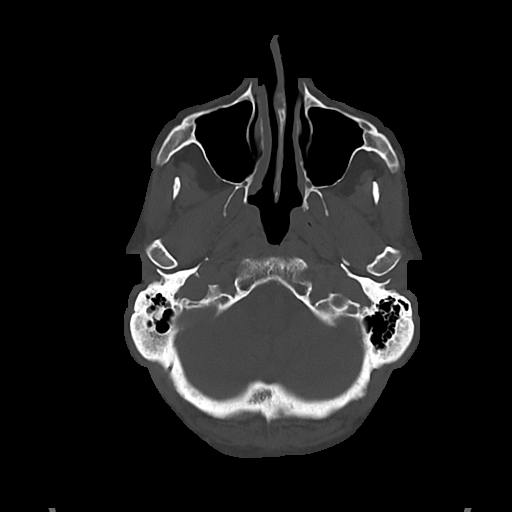
[im 17/81  bone]
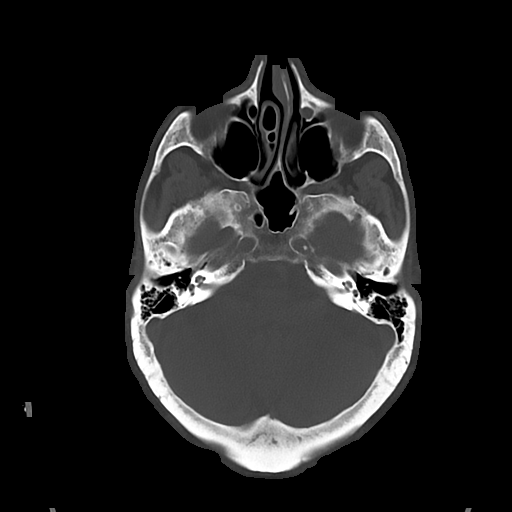
[im 25/81  bone]
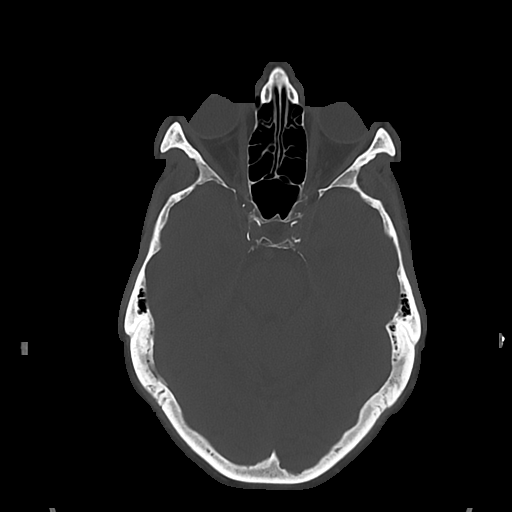

[Series 5: cor soft · coronal · 0.35mm/px · 3 of 74 slices shown]
[im 25/74  brain]
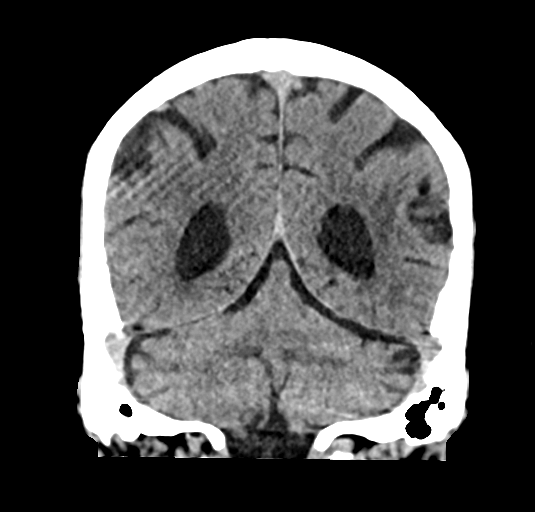
[im 33/74  brain]
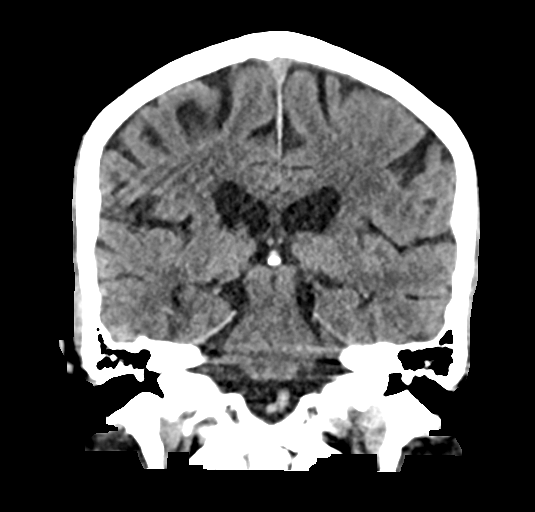
[im 41/74  brain]
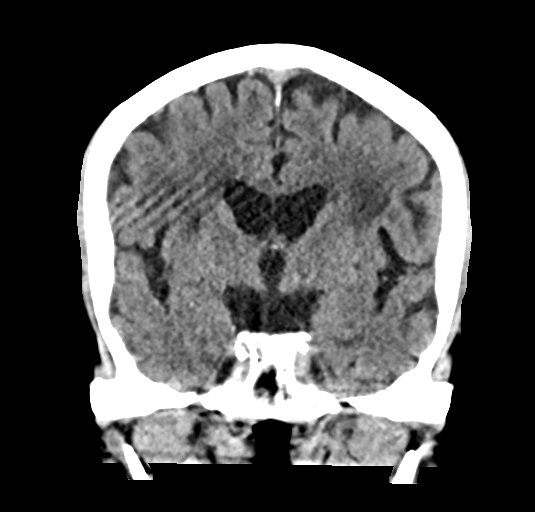

[Series 6: sag soft · sagittal · 0.36mm/px · 3 of 52 slices shown]
[im 18/52  brain]
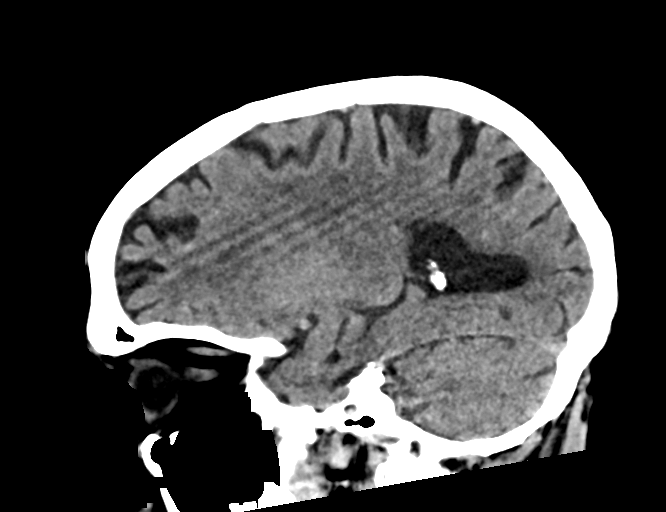
[im 26/52  brain]
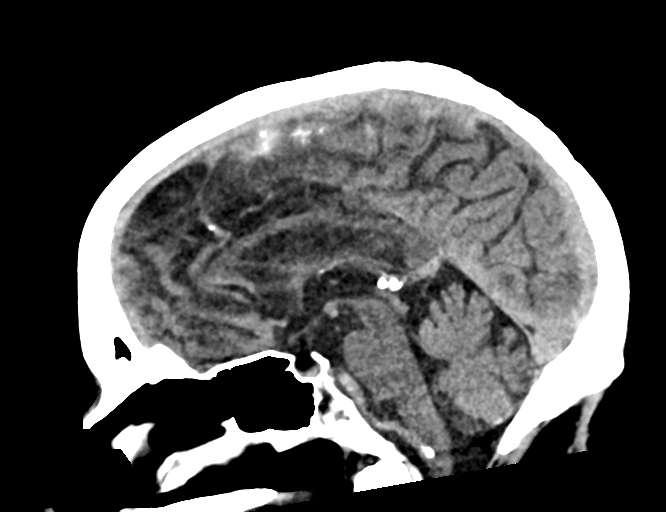
[im 35/52  brain]
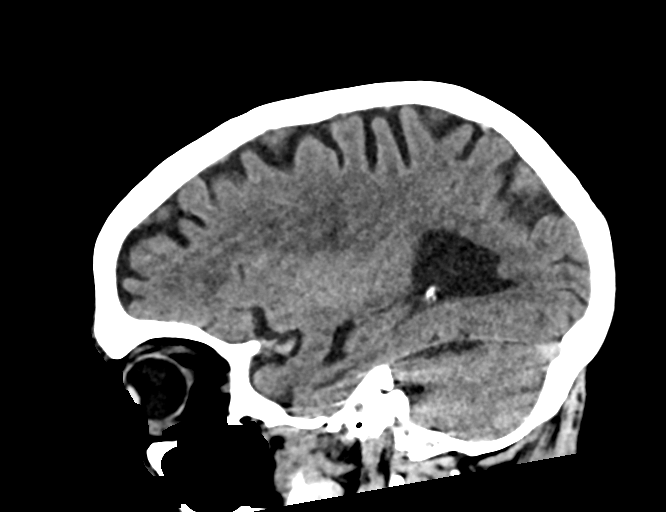

[16 of 47 positions shown; findings below may reference images not displayed]

FINDINGS: Brain: No evidence of acute infarction, hemorrhage, hydrocephalus,
extra-axial collection or mass lesion/mass effect. Mild generalized
parenchymal volume loss. Scattered hypodensities in the
periventricular and subcortical white matter, consistent with
chronic small-vessel ischemic changes and unchanged compared to
08/31/2021.

Vascular: No hyperdense vessel or unexpected calcification.

Skull: Normal. Negative for fracture or focal lesion.

Sinuses/Orbits: No acute finding. Paranasal sinuses and mastoid air
cells are clear.

Other: A 0.5 cm soft tissue density in the subcutaneous tissues of
the upper right forehead likely represents a tiny epidermal
inclusion cyst (series 3, image 21).
IMPRESSION: No acute intracranial abnormality.

Stable mild generalized parenchymal volume loss and chronic small
vessel ischemic changes.

## 2022-10-23 ENCOUNTER — Telehealth: Payer: Self-pay

## 2022-10-23 NOTE — Telephone Encounter (Signed)
Transition Care Management Unsuccessful Follow-up Telephone Call  Date of discharge and from where:  [facility] 10/21/22  Attempts:  1st Attempt  Reason for unsuccessful TCM follow-up call:  Left voice message

## 2022-10-24 NOTE — Telephone Encounter (Signed)
Transition Care Management Unsuccessful Follow-up Telephone Call  Date of discharge and from where:  [facility] 1/13  Attempts:  2nd Attempt  Reason for unsuccessful TCM follow-up call:  Left voice message

## 2022-10-27 NOTE — Telephone Encounter (Signed)
Transition Care Management Unsuccessful Follow-up Telephone Call  Date of discharge and from where:  [facility] 10/21/22  Attempts:  3rd Attempt  Reason for unsuccessful TCM follow-up call:  Left voice message

## 2022-11-08 ENCOUNTER — Telehealth: Payer: Self-pay

## 2022-11-08 NOTE — Telephone Encounter (Signed)
Pt no longer follow Dr Ronnald Ramp- moved to [facility] Direct Dial [phone]

## 2023-04-16 ENCOUNTER — Telehealth: Payer: Self-pay

## 2023-04-16 NOTE — Transitions of Care (Post Inpatient/ED Visit) (Signed)
   04/16/2023  Name: Oscar Phillips MRN: [id number] DOB: 03/19/1956  Today's TOC FU Call Status: Today's TOC FU Call Status:: Unsuccessul Call (1st Attempt) Unsuccessful Call (1st Attempt) Date: 04/16/23  Attempted to reach the patient regarding the most recent Inpatient/ED visit.  Follow Up Plan: Additional outreach attempts will be made to reach the patient to complete the Transitions of Care (Post Inpatient/ED visit) call.   Signature  Woodfin Ganja LPN [facility] Nurse Health Advisor Direct Dial [phone]

## 2023-04-17 NOTE — Transitions of Care (Post Inpatient/ED Visit) (Signed)
   04/17/2023  Name: Oscar Phillips MRN: [id number] DOB: 1956-03-24  Today's TOC FU Call Status: Today's TOC FU Call Status:: Unsuccessful Call (2nd Attempt) Unsuccessful Call (1st Attempt) Date: 04/16/23 Unsuccessful Call (2nd Attempt) Date: 04/17/23  Attempted to reach the patient regarding the most recent Inpatient/ED visit.  Follow Up Plan: Additional outreach attempts will be made to reach the patient to complete the Transitions of Care (Post Inpatient/ED visit) call.   Signature  Woodfin Ganja LPN [facility] Nurse Health Advisor Direct Dial [phone]

## 2023-04-23 NOTE — Transitions of Care (Post Inpatient/ED Visit) (Signed)
   04/23/2023  Name: Oscar Phillips MRN: [id number] DOB: 1956/07/07  Today's TOC FU Call Status: Today's TOC FU Call Status:: Successful TOC FU Call Competed Unsuccessful Call (1st Attempt) Date: 04/16/23 Unsuccessful Call (2nd Attempt) Date: 04/17/23 Unsuccessful Call (3rd Attempt) Date: 04/23/23 [facility] FU Call Complete Date: 04/23/23  Attempted to reach the patient regarding the most recent Inpatient/ED visit.  Follow Up Plan: No further outreach attempts will be made at this time. We have been unable to contact the patient.  Signature   Woodfin Ganja LPN [facility] Nurse Health Advisor Direct Dial [phone]
